# Patient Record
Sex: Female | Born: 1985
Health system: Southern US, Community
[De-identification: ages and names within clinical notes are randomized; demographics above are authoritative.]

## PROBLEM LIST (undated history)

## (undated) DIAGNOSIS — N926 Irregular menstruation, unspecified: Secondary | ICD-10-CM

## (undated) DIAGNOSIS — M329 Systemic lupus erythematosus, unspecified: Secondary | ICD-10-CM

## (undated) DIAGNOSIS — R87619 Unspecified abnormal cytological findings in specimens from cervix uteri: Secondary | ICD-10-CM

## (undated) DIAGNOSIS — A64 Unspecified sexually transmitted disease: Secondary | ICD-10-CM

## (undated) DIAGNOSIS — S86012A Strain of left Achilles tendon, initial encounter: Secondary | ICD-10-CM

## (undated) DIAGNOSIS — G43909 Migraine, unspecified, not intractable, without status migrainosus: Secondary | ICD-10-CM

## (undated) DIAGNOSIS — IMO0002 Reserved for concepts with insufficient information to code with codable children: Secondary | ICD-10-CM

## (undated) HISTORY — PX: INTRAUTERINE DEVICE (IUD) INSERTION: SHX5877

## (undated) HISTORY — PX: COLPOSCOPY: SHX161

## (undated) HISTORY — DX: Unspecified abnormal cytological findings in specimens from cervix uteri: R87.619

## (undated) HISTORY — DX: Systemic lupus erythematosus, unspecified: M32.9

## (undated) HISTORY — DX: Unspecified sexually transmitted disease: A64

## (undated) HISTORY — DX: Reserved for concepts with insufficient information to code with codable children: IMO0002

## (undated) HISTORY — PX: WISDOM TOOTH EXTRACTION: SHX21

---

## 2011-10-22 DIAGNOSIS — M329 Systemic lupus erythematosus, unspecified: Secondary | ICD-10-CM | POA: Insufficient documentation

## 2012-04-07 ENCOUNTER — Encounter: Payer: Self-pay | Admitting: Certified Nurse Midwife

## 2012-05-21 ENCOUNTER — Ambulatory Visit (INDEPENDENT_AMBULATORY_CARE_PROVIDER_SITE_OTHER): Payer: 59 | Admitting: Certified Nurse Midwife

## 2012-05-21 ENCOUNTER — Encounter: Payer: Self-pay | Admitting: Certified Nurse Midwife

## 2012-05-21 VITALS — BP 96/60 | Ht 65.0 in | Wt 204.0 lb

## 2012-05-21 DIAGNOSIS — Z01419 Encounter for gynecological examination (general) (routine) without abnormal findings: Secondary | ICD-10-CM

## 2012-05-21 DIAGNOSIS — M329 Systemic lupus erythematosus, unspecified: Secondary | ICD-10-CM

## 2012-05-21 NOTE — Patient Instructions (Signed)

## 2012-05-21 NOTE — Progress Notes (Signed)
27 y.o. SingleAfrican American female   G0P0000 here for annual exam. Periods are light and monthly. No partner change.  No STD screening needed. Diagnosed with Lupus 9-13 on medication. Sees Rheumatologist for follow-up No health issues  today   Patient's last menstrual period was 05/14/2012.          Sexually active: no  The current method of family planning is IUD.    Exercising: yes  walking Last mammogram: none Last pap: 05-01-11 Last BMD: none Alcohol: 1 a week Tobacco: none   Health Maintenance  Topic Date Due  . Pap Smear  05/22/2005  . Influenza Vaccine  10/18/2012  . Tetanus/tdap  02/17/2017    Family History  Problem Relation Age of Onset  . Hypertension Mother   . Hypertension Father   . Cancer Father     pancreatic  . Depression Father     There is no problem list on file for this patient.   Past Medical History  Diagnosis Date  . Chlamydia 2008    pt treated with neg toc  . 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 Lupus     Past Surgical History  Procedure Laterality Date  . Wisdom teeth extracted    . Colposcopy  10/2008    Allergies: Review of patient's allergies indicates no known allergies.  Current Outpatient Prescriptions  Medication Sig Dispense Refill  . hydroxychloroquine (PLAQUENIL) 200 MG tablet       . IBUPROFEN PO Take by mouth as needed.      Marland Kitchen levonorgestrel (MIRENA) 20 MCG/24HR IUD 1 each by Intrauterine route once.      . phentermine (ADIPEX-P) 37.5 MG tablet       . valACYclovir (VALTREX) 500 MG tablet Take 500 mg by mouth daily.       No current facility-administered medications for this visit.    ROS: Pertinent items are noted in HPI.  Exam:    BP 96/60  Ht 5\' 5"  (1.651 m)  Wt 204 lb (92.534 kg)  BMI 33.95 kg/m2  LMP 05/14/2012 Weight change: @WEIGHTCHANGE @ Last 3 height recordings:  Ht Readings from Last 3 Encounters:  05/21/12 5\' 5"  (1.651 m)   General appearance: alert and cooperative Head: Normocephalic, without obvious  abnormality, atraumatic Neck: no adenopathy, supple, symmetrical, trachea midline and thyroid not enlarged, symmetric, no tenderness/mass/nodules Lungs: clear to auscultation bilaterally Breasts: normal appearance, no masses or tenderness, Inspection negative, No nipple discharge or bleeding Heart: regular rate and rhythm Abdomen: soft, non-tender; bowel sounds normal; no masses,  no organomegaly Extremities: extremities normal, atraumatic, no cyanosis or edema Skin: Skin color, texture, turgor normal. No rashes or lesions Lymph nodes: Cervical, supraclavicular, and axillary nodes normal. no inguinal nodes palpated Neurologic: Alert and oriented X 3, normal strength and tone. Normal symmetric reflexes. Normal coordination and gait   Pelvic: External genitalia:  no lesions              Urethra: normal appearing urethra with no masses, tenderness or lesions              Bartholins and Skenes: Bartholin's, Urethra, Skene's normal                 Vagina: normal appearing vagina with normal color and discharge, no lesions              Cervix: normal appearance and IUD string visualized              Pap taken: yes  Bimanual Exam:  Uterus:  uterus is normal size, shape, consistency and nontender, anteverted                                      Adnexa:    normal adnexa in size, nontender and no masses                                      Rectovaginal: Confirms                                      Anus:  normal sphincter tone, no lesions  A: well woman Contraception Mirena IUD Newly diagnosed Lupus under follow up     P Reviewed health and wellness pertinent to exam Pap smear as indicated  return annually or prn      An After Visit Summary was printed and given to the patient.  Reviewed, TL

## 2012-05-25 LAB — IPS PAP TEST WITH REFLEX TO HPV

## 2012-05-27 ENCOUNTER — Telehealth: Payer: Self-pay | Admitting: Orthopedic Surgery

## 2012-05-27 NOTE — Telephone Encounter (Signed)
Spoke with pt about abnormal Pap and need for colposcopy with DL. Instructed pt she cannot be on her period at the time of appt. Sched colpo 06-02-12 at 1400 with DL. Instructed pt to take motrin 1 hr before the appt time to help with cramping. Pt agreeable.  aa

## 2012-06-02 ENCOUNTER — Ambulatory Visit: Payer: 59 | Admitting: Certified Nurse Midwife

## 2012-07-05 ENCOUNTER — Telehealth: Payer: Self-pay | Admitting: *Deleted

## 2012-07-05 NOTE — Telephone Encounter (Signed)
Patient has canceled her colpo appt and not rescheduled.  Has talked with carolyn in insurance dept regarding OOP cost and we have re-verified her estimate and it has decreased from [phone] down to 264.66v due to processing of other claims.  Patient reported to carolyn she could still not afford this and declined to resched colpo.  Call to patient to discuss, LMTCB.

## 2012-09-07 ENCOUNTER — Telehealth: Payer: Self-pay | Admitting: *Deleted

## 2012-09-07 NOTE — Telephone Encounter (Signed)
Follow up call to patient to schedule colpo that patient previously canceled due to cost.  Patient states she has had other claims filed and thinks PPO cost may have changed and requests we recheck. Advised that I can have amout recheck and recommend she make decision on plan to get colpo done here or another facility if cost prevents her from coming here.  Stressed that pap smear is a screening test to rule out cancer and that since her pap is abnormal, colpo recommended for evaluation.  Explained, "not tying to scare you but to stress the importance of this test". Will send chart to precert and have them call patient with updated estimate.

## 2012-09-08 NOTE — Telephone Encounter (Signed)
Kennon Rounds,  Thank you on your follow through for this patient.  Continue to keep her in our pap recall until we have her plan solidified.  [employer]

## 2012-09-10 ENCOUNTER — Telehealth: Payer: Self-pay | Admitting: Certified Nurse Midwife

## 2012-09-10 NOTE — Telephone Encounter (Signed)
Spoke with patient about her insurance benefits for her colpo. Patient said that she cannot afford that amount and will need to push it out farther. She expressed that she may need to look for another place to have this procedure done since we do not allow payment plans. She asked if I would check to see if there were any other options (specially related to payment) since this needs to be completed.

## 2012-09-12 NOTE — Telephone Encounter (Signed)
Please follow office policy.  She needs a 30 day letter, Kennon Rounds.

## 2012-09-14 NOTE — Telephone Encounter (Signed)
See next phone note regarding updated cost.

## 2012-09-15 ENCOUNTER — Ambulatory Visit: Payer: 59 | Admitting: Obstetrics and Gynecology

## 2012-09-15 NOTE — Telephone Encounter (Signed)
Certified letter mailed 09-13-12.

## 2012-10-05 ENCOUNTER — Telehealth: Payer: Self-pay | Admitting: Obstetrics & Gynecology

## 2012-10-05 NOTE — Telephone Encounter (Signed)
Returning your phone call about her appointment.

## 2012-10-05 NOTE — Telephone Encounter (Signed)
LMTCB to schedule colpo. Advised that patient must be seen by 8/29 or she will be released due to her receiving a 30 day letter that was sent out on 7/29.

## 2012-10-05 NOTE — Telephone Encounter (Signed)
Patient returning Carolynn's call and is ready to schedule.

## 2012-10-06 NOTE — Telephone Encounter (Signed)
Spoke with patient about the urgency of her having a colpo before her 30 day window is up. Patient is schedule with Debbi on 8/27 @ 2pm. Patient understands that she must be seen before 8/29 or she will be released from the practice. Patient is aware of her OOP for the procedure and that we will collect that amount at check-in.

## 2012-10-13 ENCOUNTER — Ambulatory Visit (INDEPENDENT_AMBULATORY_CARE_PROVIDER_SITE_OTHER): Payer: 59 | Admitting: Certified Nurse Midwife

## 2012-10-13 ENCOUNTER — Encounter: Payer: Self-pay | Admitting: Certified Nurse Midwife

## 2012-10-13 VITALS — BP 110/72 | HR 68 | Ht 65.0 in | Wt 205.0 lb

## 2012-10-13 DIAGNOSIS — R87612 Low grade squamous intraepithelial lesion on cytologic smear of cervix (LGSIL): Secondary | ICD-10-CM

## 2012-10-13 NOTE — Progress Notes (Addendum)
Patient ID: Tammy Giles, female   DOB: Jun 26, 1985, 27 y.o.   MRN: [id number]  Chief Complaint  Patient presents with  . Procedure    colposcopy    HPI Tammy Giles is a 27 y.o. female.  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 This was a follow up yearly pap from previous pap in 2013 which was LSIL. Patient aware colposcopy recommended because it is still present. 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 Previous colposcopy: in  2010 showed LSIL. Prior cervical treatment: no treatment and follow up pap only. 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 Chlamydia 2008    pt treated with neg toc  . 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 Lupus     Past Surgical History  Procedure Laterality Date  . Wisdom teeth extracted    . Colposcopy  10/2008    Family History  Problem Relation Age of Onset  . Hypertension Mother   . Hypertension Father   . Cancer Father     pancreatic  . Depression Father     Social History History  Substance Use Topics  . Smoking status: Never Smoker   . Smokeless tobacco: Never Used  . Alcohol Use: 0.5 oz/week    1 drink(s) per week    No Known Allergies  Current Outpatient Prescriptions  Medication Sig Dispense Refill  . hydroxychloroquine (PLAQUENIL) 200 MG tablet       . IBUPROFEN PO Take by mouth as needed.      Marland Kitchen levonorgestrel (MIRENA) 20 MCG/24HR IUD 1 each by Intrauterine route once.      . valACYclovir (VALTREX) 500 MG tablet Take 500 mg by mouth daily.       No current facility-administered medications for this visit.    Review of Systems Review of Systems  Genitourinary: Negative for vaginal bleeding, vaginal discharge and vaginal pain.  Pertinent to exam negative  Blood pressure 110/72, pulse 68, height 5\' 5"  (1.651 m), weight 205 lb (92.987 kg), last  menstrual period 09/29/2012.  Physical Exam Physical Exam  Constitutional: She is oriented to person, place, and time. She appears well-developed and well-nourished.  Genitourinary:    Neurological: She is alert and oriented to person, place, and time.    Data Reviewed AEX 05/21/12 normal  Assessment   History of LSIL pap colposcopy indicated. 2-Gardasil  Incomplete series, patient considering  vaccination Procedure Details  The risks and benefits of the procedure and Written informed consent obtained.  Speculum placed in vagina and excellent visualization of cervix achieved, cervix swabbed with saline solution for mucous removal, IUD string noted in cervical os, cervix swabbed x 3 with acetic acid solution with acetowhite area noted at 3-5 o'clock area. Cervix viewed with 3.75,7.5,15# and green filter. Lugols solution applied and non staining noted at 5 o'clock.  Biopsy taken. ECC obtained. Monsel's applied. No active bleeding noted upon speculum removal. Patient tolerated procedure well.  Specimens: 2  Complications: none.     Plan    Specimens labelled and sent to Pathology. Patient will be called with pathology results. Written instructions given to patient..    Pathology: Biopsy at 5 o'clock:Squamous mucosa with LSIL, mild dyplasia, changes consistent with HPV effect No high grade dysplasia JYN:WGNFA appearing endocervical mucosa and glands with no atypia or carcinoma noted. Pathology agrees with previous pap of LSIL Patient  to be notified of results and importance of follow up with pap smear in one year.   [facility] 10/13/2012, 3:18 PM Note reviewed, agree with plan.  Douglass Rivers, MD

## 2012-10-20 ENCOUNTER — Telehealth: Payer: Self-pay | Admitting: Orthopedic Surgery

## 2012-10-20 NOTE — Telephone Encounter (Signed)
Spoke with pt about colpo results. Advised pt she needs to have repeat Pap next year with Aex to make sure it has not progressed. Pt thinks she has had 2 Gardasil vaccines so far. Pt seeing MD for lupus tomorrow and will ask to make sure this is OK to finish.

## 2012-10-20 NOTE — Telephone Encounter (Signed)
Message copied by Alfredo Batty on Wed Oct 20, 2012  2:36 PM ------      Message from: Verner Chol      Created: Tue Oct 19, 2012  8:25 AM       Notify patient that biopsy showed LSIL, mild dysplasia consistent with HPV changes.      ECC negative for atypia or cancer.      It is very important for her to follow up with pap smear in one year, to make sure this has not progressed.      08      Did she complete Gardasil series? If not she will need to complete if no contraindications per her MD with Lupus ------

## 2012-10-20 NOTE — Telephone Encounter (Signed)
LMTCB for results. aa 

## 2012-10-20 NOTE — Telephone Encounter (Signed)
Message copied by Alfredo Batty on Wed Oct 20, 2012  1:24 PM ------      Message from: Verner Chol      Created: Tue Oct 19, 2012  8:25 AM       Notify patient that biopsy showed LSIL, mild dysplasia consistent with HPV changes.      ECC negative for atypia or cancer.      It is very important for her to follow up with pap smear in one year, to make sure this has not progressed.      08      Did she complete Gardasil series? If not she will need to complete if no contraindications per her MD with Lupus ------

## 2012-11-15 ENCOUNTER — Telehealth: Payer: Self-pay | Admitting: Certified Nurse Midwife

## 2012-11-15 NOTE — Telephone Encounter (Signed)
Patient has a question about the way her insurance was filed.

## 2012-11-16 NOTE — Telephone Encounter (Signed)
Patient calling to speak with Shanda Bumps in billing again.

## 2012-11-17 NOTE — Telephone Encounter (Signed)
Patient calling to speak with Shanda Bumps again

## 2013-01-17 ENCOUNTER — Telehealth: Payer: Self-pay | Admitting: *Deleted

## 2013-01-17 MED ORDER — VALACYCLOVIR HCL 500 MG PO TABS: 500.0000 mg | ORAL_TABLET | Freq: Every day | ORAL | Status: DC

## 2013-01-17 NOTE — Telephone Encounter (Signed)
Annual Exam 05/21/12 Rx was not given, but was in chart that it was refilled 12/09/11.

## 2013-01-17 NOTE — Telephone Encounter (Signed)
Ok to refill 

## 2013-05-25 ENCOUNTER — Ambulatory Visit (INDEPENDENT_AMBULATORY_CARE_PROVIDER_SITE_OTHER): Payer: 59 | Admitting: Certified Nurse Midwife

## 2013-05-25 ENCOUNTER — Encounter: Payer: Self-pay | Admitting: Certified Nurse Midwife

## 2013-05-25 ENCOUNTER — Ambulatory Visit: Payer: Self-pay | Admitting: Certified Nurse Midwife

## 2013-05-25 VITALS — BP 122/74 | HR 80 | Ht 65.25 in | Wt 227.0 lb

## 2013-05-25 DIAGNOSIS — Z01419 Encounter for gynecological examination (general) (routine) without abnormal findings: Secondary | ICD-10-CM

## 2013-05-25 DIAGNOSIS — Z0000 Encounter for general adult medical examination without abnormal findings: Secondary | ICD-10-CM

## 2013-05-25 LAB — POCT URINALYSIS DIPSTICK
BILIRUBIN UA: NEGATIVE
Blood, UA: NEGATIVE
Glucose, UA: NEGATIVE
Ketones, UA: NEGATIVE
LEUKOCYTES UA: NEGATIVE
NITRITE UA: NEGATIVE
PH UA: 5
Protein, UA: NEGATIVE
UROBILINOGEN UA: NEGATIVE

## 2013-05-25 LAB — HEMOGLOBIN, FINGERSTICK: HEMOGLOBIN, FINGERSTICK: 13.7 g/dL (ref 12.0–16.0)

## 2013-05-25 NOTE — Progress Notes (Signed)
Patient ID: Tammy Giles, female   DOB: 04-03-1985, 28 y.o.   MRN: [id number] 28 y.o. G0P0000 Single African American Fe here for annual exam. Periods spotting or light with Mirena IUD. No partner change or STD screening needed.Continues with immunologist for care for Lupus, medication stable Uses Urgent care if needed. Aware she has gained some weight, has started on exercise and portion control again. No health issues today.   Patient's last menstrual period was 05/18/2013.          Sexually active: no  The current method of family planning is IUD and abstinence.    Exercising: yes  Gym/ health club routine includes aerobics 2 times per week. Smoker:  no  Health Maintenance: Pap:  05/21/12, LGSIL; Colpo, LSIL, mild dysplasia consistent with HPV changes TDaP:  2009 Gardasil:  ? completed 3 injections Labs:  HB:  13.7 Urine:negative    reports that she has never smoked. She has never used smokeless tobacco. She reports that she drinks about 1.5 - 2 ounces of alcohol per week. She reports that she does not use illicit drugs.  Past Medical History  Diagnosis Date  . Chlamydia 2008    pt treated with neg toc  . 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 Lupus     Past Surgical History  Procedure Laterality Date  . Wisdom teeth extracted    . Colposcopy  10/2008    Current Outpatient Prescriptions  Medication Sig Dispense Refill  . hydroxychloroquine (PLAQUENIL) 200 MG tablet       . IBUPROFEN PO Take by mouth as needed.      Marland Kitchen levonorgestrel (MIRENA) 20 MCG/24HR IUD 1 each by Intrauterine route once.      . SUMAtriptan (IMITREX) 100 MG tablet Take 100 mg by mouth every 2 (two) hours as needed for migraine or headache. May repeat in 2 hours if headache persists or recurs.      . valACYclovir (VALTREX) 500 MG tablet Take 1 tablet (500 mg total) by mouth daily.  30 tablet  11   No current facility-administered medications for this visit.    Family History  Problem Relation Age of Onset  .  Hypertension Mother   . Hypertension Father   . Cancer Father     pancreatic  . Depression Father     ROS:  Pertinent items are noted in HPI.  Otherwise, a comprehensive ROS was negative.  Exam:   BP 122/74  Pulse 80  Ht 5' 5.25" (1.657 m)  Wt 227 lb (102.967 kg)  BMI 37.50 kg/m2  LMP 05/18/2013 Height: 5' 5.25" (165.7 cm)  Ht Readings from Last 3 Encounters:  05/25/13 5' 5.25" (1.657 m)  10/13/12 5\' 5"  (1.651 m)  05/21/12 5\' 5"  (1.651 m)    General appearance: alert, cooperative and appears stated age Head: Normocephalic, without obvious abnormality, atraumatic Neck: no adenopathy, supple, symmetrical, trachea midline and thyroid normal to inspection and palpation Lungs: clear to auscultation bilaterally Breasts: normal appearance, no masses or tenderness, No nipple retraction or dimpling, No nipple discharge or bleeding, No axillary or supraclavicular adenopathy Heart: regular rate and rhythm Abdomen: soft, non-tender; no masses,  no organomegaly Extremities: extremities normal, atraumatic, no cyanosis or edema Skin: Skin color, texture, turgor normal. No rashes or lesions Lymph nodes: Cervical, supraclavicular, and axillary nodes normal. No abnormal inguinal nodes palpated Neurologic: Grossly normal   Pelvic: External genitalia:  no lesions              Urethra:  normal appearing  urethra with no masses, tenderness or lesions              Bartholin's and Skene's: normal                 Vagina: normal appearing vagina with normal color and discharge, no lesions              Cervix: normal appearance, non tender IUD string present              Pap taken: yes Bimanual Exam:  Uterus:  normal size, contour, position, consistency, mobility, non-tender and anteverted              Adnexa: normal adnexa and no mass, fullness, tenderness               Rectovaginal: Confirms               Anus:  deferred  A:  Well Woman with normal exam  Contraception Mirena  History of  abnormal pap smear with LSIL, CIN 1 with colpo follow up pap today  Lupus stable on medication, sees immunologist for follow up  P:   Reviewed health and wellness pertinent to exam  Mirena due for removal 4/18  Pap smear as per guidelines   pap smear taken today with reflex if normal repeat one year,if not per result  counseled on breast self exam, STD prevention, HIV risk factors and prevention, adequate intake of calcium and vitamin D, diet and exercise encouraged.  return annually or prn  An After Visit Summary was printed and given to the patient.

## 2013-05-25 NOTE — Patient Instructions (Signed)
General topics  Next pap or exam is  due in 1 year Take a Women's multivitamin Take 1200 mg. of calcium daily - prefer dietary If any concerns in interim to call back  Breast Self-Awareness Practicing breast self-awareness may pick up problems early, prevent significant medical complications, and possibly save your life. By practicing breast self-awareness, you can become familiar with how your breasts look and feel and if your breasts are changing. This allows you to notice changes early. It can also offer you some reassurance that your breast health is good. One way to learn what is normal for your breasts and whether your breasts are changing is to do a breast self-exam. If you find a lump or something that was not present in the past, it is best to contact your caregiver right away. Other findings that should be evaluated by your caregiver include nipple discharge, especially if it is bloody; skin changes or reddening; areas where the skin seems to be pulled in (retracted); or new lumps and bumps. Breast pain is seldom associated with cancer (malignancy), but should also be evaluated by a caregiver. BREAST SELF-EXAM The best time to examine your breasts is 5 7 days after your menstrual period is over.  ExitCare Patient Information 2013 [employer].   Exercise to Stay Healthy Exercise helps you become and stay healthy. EXERCISE IDEAS AND TIPS Choose exercises that:  You enjoy.  Fit into your day. You do not need to exercise really hard to be healthy. You can do exercises at a slow or medium level and stay healthy. You can:  Stretch before and after working out.  Try yoga, Pilates, or tai chi.  Lift weights.  Walk fast, swim, jog, run, climb stairs, bicycle, dance, or rollerskate.  Take aerobic classes. Exercises that burn about 150 calories:  Running 1  miles in 15 minutes.  Playing volleyball for 45 to 60 minutes.  Washing and waxing a car for 45 to 60  minutes.  Playing touch football for 45 minutes.  Walking 1  miles in 35 minutes.  Pushing a stroller 1  miles in 30 minutes.  Playing basketball for 30 minutes.  Raking leaves for 30 minutes.  Bicycling 5 miles in 30 minutes.  Walking 2 miles in 30 minutes.  Dancing for 30 minutes.  Shoveling snow for 15 minutes.  Swimming laps for 20 minutes.  Walking up stairs for 15 minutes.  Bicycling 4 miles in 15 minutes.  Gardening for 30 to 45 minutes.  Jumping rope for 15 minutes.  Washing windows or floors for 45 to 60 minutes. Document Released: 03/08/2010 Document Revised: 04/28/2011 Document Reviewed: 03/08/2010 ExitCare Patient Information 2013 [employer].   Other topics ( that may be useful information):    Sexually Transmitted Disease Sexually transmitted disease (STD) refers to any infection that is passed from person to person during sexual activity. This may happen by way of saliva, semen, blood, vaginal mucus, or urine. Common STDs include:  Gonorrhea.  Chlamydia.  Syphilis.  HIV/AIDS.  Genital herpes.  Hepatitis B and C.  Trichomonas.  Human papillomavirus (HPV).  Pubic lice. CAUSES  An STD may be spread by bacteria, virus, or parasite. A person can get an STD by:  Sexual intercourse with an infected person.  Sharing sex toys with an infected person.  Sharing needles with an infected person.  Having intimate contact with the genitals, mouth, or rectal areas of an infected person. SYMPTOMS  Some people may not have any symptoms, but   not have any symptoms, but they can still pass the infection to others. Different STDs have different symptoms. Symptoms include:  Painful or bloody urination.  Pain in the pelvis, abdomen, vagina, anus, throat, or eyes.  Skin rash, itching, irritation, growths, or sores (lesions). These usually occur in the genital or anal area.  Abnormal vaginal discharge.  Penile discharge in men.  Soft, flesh-colored skin growths in the  genital or anal area.  Fever.  Pain or bleeding during sexual intercourse.  Swollen glands in the groin area.  Yellow skin and eyes (jaundice). This is seen with hepatitis. DIAGNOSIS  To make a diagnosis, your caregiver may:  Take a medical history.  Perform a physical exam.  Take a specimen (culture) to be examined.  Examine a sample of discharge under a microscope.  Perform blood test TREATMENT   Chlamydia, gonorrhea, trichomonas, and syphilis can be cured with antibiotic medicine.  Genital herpes, hepatitis, and HIV can be treated, but not cured, with prescribed medicines. The medicines will lessen the symptoms.  Genital warts from HPV can be treated with medicine or by freezing, burning (electrocautery), or surgery. Warts may come back.  HPV is a virus and cannot be cured with medicine or surgery.However, abnormal areas may be followed very closely by your caregiver and may be removed from the cervix, vagina, or vulva through office procedures or surgery. If your diagnosis is confirmed, your recent sexual partners need treatment. This is true even if they are symptom-free or have a negative culture or evaluation. They should not have sex until their caregiver says it is okay. HOME CARE INSTRUCTIONS  All sexual partners should be informed, tested, and treated for all STDs.  Take your antibiotics as directed. Finish them even if you start to feel better.  Only take over-the-counter or prescription medicines for pain, discomfort, or fever as directed by your caregiver.  Rest.  Eat a balanced diet and drink enough fluids to keep your urine clear or pale yellow.  Do not have sex until treatment is completed and you have followed up with your caregiver. STDs should be checked after treatment.  Keep all follow-up appointments, Pap tests, and blood tests as directed by your caregiver.  Only use latex condoms and water-soluble lubricants during sexual activity. Do not use  petroleum jelly or oils.  Avoid alcohol and illegal drugs.  Get vaccinated for HPV and hepatitis. If you have not received these vaccines in the past, talk to your caregiver about whether one or both might be right for you.  Avoid risky sex practices that can break the skin. The only way to avoid getting an STD is to avoid all sexual activity.Latex condoms and dental dams (for oral sex) will help lessen the risk of getting an STD, but will not completely eliminate the risk. SEEK MEDICAL CARE IF:   You have a fever.  You have any new or worsening symptoms. Document Released: 04/26/2002 Document Revised: 04/28/2011 Document Reviewed: 05/03/2010 [facility] Patient Information 2013 [place].    Domestic Abuse You are being battered or abused if someone close to you hits, pushes, or physically hurts you in any way. You also are being abused if you are forced into activities. You are being sexually abused if you are forced to have sexual contact of any kind. You are being emotionally abused if you are made to feel worthless or if you are constantly threatened. It is important to remember that help is available. No one has the right to  ABUSE  Learn the warning signs of danger. This varies with situations but may include: the use of alcohol, threats, isolation from friends and family, or forced sexual contact. Leave if you feel that violence is going to occur.  If you are attacked or beaten, report it to the police so the abuse is documented. You do not have to press charges. The police can protect you while you or the attackers are leaving. Get the officer's name and badge number and a copy of the report.  Find someone you can trust and tell them what is happening to you: your caregiver, a nurse, clergy member, close friend or family member. Feeling ashamed is natural, but remember that you have done nothing wrong. No one deserves abuse. Document Released:  02/01/2000 Document Revised: 04/28/2011 Document Reviewed: 04/11/2010 ExitCare Patient Information 2013 [employer]Care[employer].    How Much is Too Much Alcohol? Drinking too much alcohol can cause injury, accidents, and health problems. These types of problems can include:   Car crashes.  Falls.  Family fighting (domestic violence).  Drowning.  Fights.  Injuries.  Burns.  Damage to certain organs.  Having a baby with birth defects. ONE DRINK CAN BE TOO MUCH WHEN YOU ARE:  Working.  Pregnant or breastfeeding.  Taking medicines. Ask your doctor.  Driving or planning to drive. If you or someone you know has a drinking problem, get help from a doctor.  Document Released: 11/30/2008 Document Revised: 04/28/2011 Document Reviewed: 11/30/2008 ExitCare Patient Information 2013 ExitCare[employer].   Smoking Hazards Smoking cigarettes is extremely bad for your health. Tobacco smoke has over 200 known poisons in it. There are over 60 chemicals in tobacco smoke that cause cancer. Some of the chemicals found in cigarette smoke include:   Cyanide.  Benzene.  Formaldehyde.  Methanol (wood alcohol).  Acetylene (fuel used in welding torches).  Ammonia. Cigarette smoke also contains the poisonous gases nitrogen oxide and carbon monoxide.  Cigarette smokers have an increased risk of many serious medical problems and Smoking causes approximately:  90% of all lung cancer deaths in men.  80% of all lung cancer deaths in women.  90% of deaths from chronic obstructive lung disease. Compared with nonsmokers, smoking increases the risk of:  Coronary heart disease by 2 to 4 times.  Stroke by 2 to 4 times.  Men developing lung cancer by 23 times.  Women developing lung cancer by 13 times.  Dying from chronic obstructive lung diseases by 12 times.  . Smoking is the most preventable cause of death and disease in our society.  WHY IS SMOKING ADDICTIVE?  Nicotine is the chemical  agent in tobacco that is capable of causing addiction or dependence.  When you smoke and inhale, nicotine is absorbed rapidly into the bloodstream through your lungs. Nicotine absorbed through the lungs is capable of creating a powerful addiction. Both inhaled and non-inhaled nicotine may be addictive.  Addiction studies of cigarettes and spit tobacco show that addiction to nicotine occurs mainly during the teen years, when young people begin using tobacco products. WHAT ARE THE BENEFITS OF QUITTING?  There are many health benefits to quitting smoking.   Likelihood of developing cancer and heart disease decreases. Health improvements are seen almost immediately.  Blood pressure, pulse rate, and breathing patterns start returning to normal soon after quitting. QUITTING SMOKING   American Lung Association - 1-800-LUNGUSA  American Cancer Society - 1-800-ACS-2345 Document Released: 03/13/2004 Document Revised: 04/28/2011 Document Reviewed: 11/15/2008 ExitCare Patient Information 2013 ExitCare,   ExitCare Patient Information 2013 [place], [place].   Stress Management Stress is a state of physical or mental tension that often results from changes in your life or normal routine. Some common causes of stress are:  Death of a loved one.  Injuries or severe illnesses.  Getting fired or changing jobs.  Moving into a new home. Other causes may be:  Sexual problems.  Business or financial losses.  Taking on a large debt.  Regular conflict with someone at home or at work.  Constant tiredness from lack of sleep. It is not just bad things that are stressful. It may be stressful to:  Win the lottery.  Get married.  Buy a new car. The amount of stress that can be easily tolerated varies from person to person. Changes generally cause stress, regardless of the types of change. Too much stress can affect your health. It may lead to physical or emotional problems. Too little stress (boredom) may also become stressful. SUGGESTIONS TO  REDUCE STRESS:  Talk things over with your family and friends. It often is helpful to share your concerns and worries. If you feel your problem is serious, you may want to get help from a professional counselor.  Consider your problems one at a time instead of lumping them all together. Trying to take care of everything at once may seem impossible. List all the things you need to do and then start with the most important one. Set a goal to accomplish 2 or 3 things each day. If you expect to do too many in a single day you will naturally fail, causing you to feel even more stressed.  Do not use alcohol or drugs to relieve stress. Although you may feel better for a short time, they do not remove the problems that caused the stress. They can also be habit forming.  Exercise regularly - at least 3 times per week. Physical exercise can help to relieve that "uptight" feeling and will relax you.  The shortest distance between despair and hope is often a good night's sleep.  Go to bed and get up on time allowing yourself time for appointments without being rushed.  Take a short "time-out" period from any stressful situation that occurs during the day. Close your eyes and take some deep breaths. Starting with the muscles in your face, tense them, hold it for a few seconds, then relax. Repeat this with the muscles in your neck, shoulders, hand, stomach, back and legs.  Take good care of yourself. Eat a balanced diet and get plenty of rest.  Schedule time for having fun. Take a break from your daily routine to relax. HOME CARE INSTRUCTIONS   Call if you feel overwhelmed by your problems and feel you can no longer manage them on your own.  Return immediately if you feel like hurting yourself or someone else. Document Released: 07/30/2000 Document Revised: 04/28/2011 Document Reviewed: 03/22/2007 [facility] Patient Information 2013 [place], [place].  Exercise to Lose Weight Exercise and a healthy diet  may help you lose weight. Your doctor may suggest specific exercises. EXERCISE IDEAS AND TIPS  Choose low-cost things you enjoy doing, such as walking, bicycling, or exercising to workout videos.  Take stairs instead of the elevator.  Walk during your lunch break.  Park your car further away from work or school.  Go to a gym or an exercise class.  Start with 5 to 10 minutes of exercise each day. Build up to 30 minutes of exercise 4 to  6 days a week.  Wear shoes with good support and comfortable clothes.  Stretch before and after working out.  Work out until you breathe harder and your heart beats faster.  Drink extra water when you exercise.  Do not do so much that you hurt yourself, feel dizzy, or get very short of breath. Exercises that burn about 150 calories:  Running 1  miles in 15 minutes.  Playing volleyball for 45 to 60 minutes.  Washing and waxing a car for 45 to 60 minutes.  Playing touch football for 45 minutes.  Walking 1  miles in 35 minutes.  Pushing a stroller 1  miles in 30 minutes.  Playing basketball for 30 minutes.  Raking leaves for 30 minutes.  Bicycling 5 miles in 30 minutes.  Walking 2 miles in 30 minutes.  Dancing for 30 minutes.  Shoveling snow for 15 minutes.  Swimming laps for 20 minutes.  Walking up stairs for 15 minutes.  Bicycling 4 miles in 15 minutes.  Gardening for 30 to 45 minutes.  Jumping rope for 15 minutes.  Washing windows or floors for 45 to 60 minutes. Document Released: 03/08/2010 Document Revised: 04/28/2011 Document Reviewed: 03/08/2010 [facility] Patient Information 2014 [place], [place].

## 2013-05-27 LAB — IPS PAP TEST WITH REFLEX TO HPV

## 2013-05-27 NOTE — Progress Notes (Signed)
Reviewed personally.  M. Suzanne Geo Slone, MD.  

## 2013-05-29 ENCOUNTER — Other Ambulatory Visit: Payer: Self-pay | Admitting: Certified Nurse Midwife

## 2013-05-29 DIAGNOSIS — R87612 Low grade squamous intraepithelial lesion on cytologic smear of cervix (LGSIL): Secondary | ICD-10-CM

## 2013-05-30 ENCOUNTER — Telehealth: Payer: Self-pay | Admitting: Emergency Medicine

## 2013-05-30 NOTE — Telephone Encounter (Signed)
Spoke with patient and message from [name] CNM given. Patient is agreeable to schedule colposcopy. She remembers procedure from last year.   Colposcopy pre-procedure instructions given.  Patient has Mirena IUD and has light short periods, if any. Will call if has any bleeding prior to appointment.  Motrin instructions given. Motrin=Advil=Ibuprofen Can take 800 mg (Can purchase over the counter, you will need four 200 mg pills) every 8 hours as needed.  Take with food. Make sure to eat a meal before appointment and drink plenty of fluids. Patient verbalized understanding and will call to reschedule if will be on menses or has any concerns regarding pregnancy. Advised will need to cancel within 24 hours or will have $100.00 no show fee placed to account.  Order is precerted. Patient aware of benefits. She is waiting for her benefits card in the mail, so requested appointment to be scheduled further out to end of the month.   Routing to provider for final review. Patient agreeable to disposition. Will close encounter

## 2013-05-30 NOTE — Telephone Encounter (Signed)
Message copied by Joeseph AmorFAST, Rithika Seel L on Mon May 30, 2013  1:28 PM ------      Message from: Verner CholLEONARD, DEBORAH S      Created: Sun May 29, 2013  5:22 PM       Please notify patient that pap smear again shows LSIL as in 2014 and will need another colpo to make sure no other changes.      We discussed this if pap was abnormal at [facility]. Very important she has this follow up      Order in ------

## 2013-06-10 ENCOUNTER — Telehealth: Payer: Self-pay | Admitting: Orthopedic Surgery

## 2013-06-10 NOTE — Telephone Encounter (Signed)
Spoke with pt to reschedule colposcopy appt for 06-14-13 as DL will be out of the office that day. Pt is on cycle now with IUD. Made appt for 06-16-13 at 2 pm with DL. Pt to give 3 days notice if she has a conflict.

## 2013-06-14 ENCOUNTER — Ambulatory Visit: Payer: 59 | Admitting: Certified Nurse Midwife

## 2013-06-16 ENCOUNTER — Ambulatory Visit: Payer: 59

## 2013-06-16 ENCOUNTER — Ambulatory Visit (INDEPENDENT_AMBULATORY_CARE_PROVIDER_SITE_OTHER): Payer: 59 | Admitting: Certified Nurse Midwife

## 2013-06-16 ENCOUNTER — Encounter: Payer: Self-pay | Admitting: Certified Nurse Midwife

## 2013-06-16 VITALS — BP 110/64 | HR 70 | Resp 16 | Ht 65.25 in | Wt 225.0 lb

## 2013-06-16 DIAGNOSIS — R87612 Low grade squamous intraepithelial lesion on cytologic smear of cervix (LGSIL): Secondary | ICD-10-CM

## 2013-06-16 DIAGNOSIS — R87619 Unspecified abnormal cytological findings in specimens from cervix uteri: Secondary | ICD-10-CM

## 2013-06-16 NOTE — Progress Notes (Addendum)
Patient ID: Tammy Giles, female   DOB: 02/04/1986, 28 y.o.   MRN: [id number]  Chief Complaint  Patient presents with  . Colposcopy    HPI Tammy Giles is a 28 y.o. female.  African American G0P0 here for colposcopy exam. Denies pelvic pain or vaginal bleeding. Contraception Mirena IUD.  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 Previous colposcopy: LSIL/CIN 1 in 05/21/12 Prior cervical treatment: none.  Past Medical History  Diagnosis Date  . Chlamydia 2008    pt treated with neg toc  . 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 Lupus   . Abnormal Pap smear of cervix     2009,2010,2011, 2013 LSIL,05-25-13 LGSIL    Past Surgical History  Procedure Laterality Date  . Wisdom teeth extracted    . Colposcopy      2009 CIN1 & 2010  . Intrauterine device (iud) insertion      mirena iud inserted 06-06-11    Family History  Problem Relation Age of Onset  . Hypertension Mother   . Hypertension Father   . Cancer Father     pancreatic  . Depression Father     Social History History  Substance Use Topics  . Smoking status: Never Smoker   . Smokeless tobacco: Never Used  . Alcohol Use: 1.5 - 2.0 oz/week    3-4 drink(s) per week    No Known Allergies  Current Outpatient Prescriptions  Medication Sig Dispense Refill  . hydroxychloroquine (PLAQUENIL) 200 MG tablet daily.       . IBUPROFEN PO Take by mouth as needed.      Marland Kitchen. levonorgestrel (MIRENA) 20 MCG/24HR IUD 1 each by Intrauterine route once.      Marland Kitchen. NAPROXEN PO Take by mouth as needed.      . SUMAtriptan (IMITREX) 100 MG tablet Take 100 mg by mouth every 2 (two) hours as needed for migraine or headache. May repeat in 2 hours if headache persists or recurs.      . valACYclovir (VALTREX) 500 MG tablet Take 1 tablet (500 mg total) by mouth daily.  30 tablet  11   No current facility-administered medications for this visit.    Review of  Systems Review of Systems  Constitutional: Negative.   Genitourinary: Negative for vaginal bleeding, vaginal discharge and vaginal pain.    Blood pressure 110/64, pulse 70, resp. rate 16, height 5' 5.25" (1.657 m), weight 225 lb (102.059 kg), last menstrual period 05/18/2013.  Physical Exam Physical Exam  Constitutional: She is oriented to person, place, and time. She appears well-developed and well-nourished.  Genitourinary: Vagina normal. No vaginal discharge found.    Neurological: She is alert and oriented to person, place, and time.  Skin: Skin is warm and dry.  Psychiatric: She has a normal mood and affect. Judgment normal.    Data Reviewed  Reviewed pap smear with patient. Questions addressed.  Assessment   Colposcopy exam Procedure Details  The risks and benefits of the procedure and Written informed consent obtained.  Speculum placed in vagina and excellent visualization of cervix achieved, cervix swabbed x 3 with saline and  acetic acid solution. Acetowhite response noted at 7 o'clock and 12 o'clock. Cervix viewed with 3.75,7.7,15# and green filter. Lugol's solution applied with same area no staining noted. Biopsy taken at 7, 12 o'clock. ECC taken. Monsel's applied. No active bleeding noted. Speculum removed. Patient tolerated procedure well. Instructions given.  Specimens: 3  Complications: none.  Plan    Specimens labelled and sent to Pathology. Patient will be notified after pathology reviewed. Pathology reviewed and both biopsy showed LSIL mild dysplasia, consistent with HPV change. ECC did not survive processing. Patient to be notified of results and need for follow up pap in one year. Pap recall       Verner CholDeborah S Leonard 06/16/2013, 2:50 PM

## 2013-06-16 NOTE — Patient Instructions (Signed)

## 2013-06-16 NOTE — Progress Notes (Signed)
Pt has hx of LGSIL on paps from 2009,2010,2011,2013 & colpo CIN1 2009. Pap 05-25-13 LGSIL Pt took 800mg  ibuprofen at 1:30 today

## 2013-06-16 NOTE — Progress Notes (Signed)
Reviewed personally.  M. Suzanne Coline Calkin, MD.  

## 2013-06-21 LAB — IPS OTHER TISSUE BIOPSY

## 2014-03-21 ENCOUNTER — Other Ambulatory Visit: Payer: Self-pay | Admitting: Certified Nurse Midwife

## 2014-03-21 NOTE — Telephone Encounter (Signed)
Medication refill request: Valtrex 500 mg Last AEX:  05/25/13 Next AEX: 05/29/14 Last MMG (if hormonal medication request): none Refill authorized: 01/17/13 #[id number]. Today #30/2R?

## 2014-04-26 DIAGNOSIS — Z79899 Other long term (current) drug therapy: Secondary | ICD-10-CM | POA: Insufficient documentation

## 2014-05-13 ENCOUNTER — Encounter (HOSPITAL_COMMUNITY): Payer: Self-pay | Admitting: Nurse Practitioner

## 2014-05-13 ENCOUNTER — Emergency Department (HOSPITAL_COMMUNITY): Payer: 59

## 2014-05-13 ENCOUNTER — Emergency Department (HOSPITAL_COMMUNITY)
Admission: EM | Admit: 2014-05-13 | Discharge: 2014-05-13 | Disposition: A | Discharge status: Home / Self Care | Payer: 59 | Attending: Emergency Medicine | Admitting: Emergency Medicine

## 2014-05-13 DIAGNOSIS — Y9367 Activity, basketball: Secondary | ICD-10-CM | POA: Diagnosis not present

## 2014-05-13 DIAGNOSIS — Z8619 Personal history of other infectious and parasitic diseases: Secondary | ICD-10-CM | POA: Diagnosis not present

## 2014-05-13 DIAGNOSIS — S86002A Unspecified injury of left Achilles tendon, initial encounter: Secondary | ICD-10-CM | POA: Diagnosis not present

## 2014-05-13 DIAGNOSIS — M329 Systemic lupus erythematosus, unspecified: Secondary | ICD-10-CM | POA: Diagnosis not present

## 2014-05-13 DIAGNOSIS — Z79899 Other long term (current) drug therapy: Secondary | ICD-10-CM | POA: Diagnosis not present

## 2014-05-13 DIAGNOSIS — Z793 Long term (current) use of hormonal contraceptives: Secondary | ICD-10-CM | POA: Insufficient documentation

## 2014-05-13 DIAGNOSIS — S99912A Unspecified injury of left ankle, initial encounter: Secondary | ICD-10-CM | POA: Diagnosis present

## 2014-05-13 DIAGNOSIS — X58XXXA Exposure to other specified factors, initial encounter: Secondary | ICD-10-CM | POA: Insufficient documentation

## 2014-05-13 DIAGNOSIS — Y92310 Basketball court as the place of occurrence of the external cause: Secondary | ICD-10-CM | POA: Insufficient documentation

## 2014-05-13 DIAGNOSIS — S86012A Strain of left Achilles tendon, initial encounter: Secondary | ICD-10-CM

## 2014-05-13 DIAGNOSIS — Y998 Other external cause status: Secondary | ICD-10-CM | POA: Insufficient documentation

## 2014-05-13 HISTORY — DX: Strain of left Achilles tendon, initial encounter: S86.012A

## 2014-05-13 MED ORDER — HYDROCODONE-ACETAMINOPHEN 5-325 MG PO TABS
2.0000 | ORAL_TABLET | Freq: Once | ORAL | Status: AC
Start: 2014-05-13 — End: 2014-05-13
  Administered 2014-05-13: 2 via ORAL
  Filled 2014-05-13: qty 2

## 2014-05-13 MED ORDER — HYDROCODONE-ACETAMINOPHEN 5-325 MG PO TABS: 1.0000 | ORAL_TABLET | Freq: Four times a day (QID) | ORAL | Status: DC | PRN

## 2014-05-13 NOTE — Discharge Instructions (Signed)
Achilles Tendon Rupture with Phase I Rehab A complete tear in the Achilles tendon is known as an Achilles tendon rupture. The Achilles tendon, which is also known as the heel cord, connects the large calf muscles (gastrocnemius and soleus) to the heel bone (calcaneus) and is essential for proper functioning of the calf muscles. The calf muscles are required for pushing the foot downward and are necessary for walking, running, and jumping. SYMPTOMS   A "pop" or tear may be felt at the time of injury.  Pain and weakness during movement, especially when raising the heel.  Tenderness, swelling, warmth, and redness around the Achilles tendon.  Bruising (contusion) around the back of the ankle after 48 hours.  Loss of firmness to the touch over the area of the Achilles tendon that ruptured. CAUSES   Achilles tendon rupture is most commonly caused by a sudden force placed upon the Achilles tendon that is greater than the tendon can withstand (for example jumping, hurdling, or sprinting).  Achilles tendon rupture may also occur from direct trauma or injury to the lower leg, foot, or ankle. RISK INCREASES WITH:  Physical activity that involves sudden muscle contraction.  Poor strength and flexibility of the lower leg.  Previous injury to the tendon.  Untreated Achilles tendinitis.  Steroid injection into the Achilles tendon.  Medical conditions, such as poor circulation due to a cardiovascular condition or obesity. PREVENTION   Include a proper warm-up and stretching routine before physical activity.  Allow for rest and recovery between physical activity.  Maintain lower leg fitness:  Flexibility.  Muscle strength.  Muscular endurance.  Cardiovascular fitness.  Mechanical prevention:  Taping.  Protective strapping.  An adhesive bandaging that has been recommended prior to physical activity. TREATMENT  Initially one should not walk on the affected leg. One should also  ice the injured area, apply compression with an elastic bandage, and elevate the injured leg to eye level. Both surgical and nonsurgical interventions exist for definitive treatment. The return to sports is usually about the same with either treatment course but can occur a few weeks sooner with surgery.   Nonsurgical treatment typically requires the immobilization in the form of a long leg cast (from toes to groin) for 4 to 9 weeks. The long leg cast is followed by a short leg cast or walking boot for an additional 4 to 12 weeks.  Advantages: Non-surgical treatment lacks the risks involved with anesthesia or surgery (such as infection, bleeding, or injury to nerves).  Disadvantages: Non-surgical treatment involves a longer period of immobilization. This may result in stiffer ankle and knee joints. Also, the calf muscles are slightly weaker and the risk of repeat rupture is higher.  Surgical treatment typically requires sewing the ends of the tendon back together, followed by immobilization (typically a short leg cast or walking boot).  Advantages: Surgical treatment does not usually require the immobilization of the knee. Surgery also offers a lower risk of repeat rupture of the tendon and slightly stronger calf muscles.  Disadvantages: Surgical treatment does include the risks of anesthesia and surgery. These risks include impaired wound healing and injury to a nerve that provides sensation to the side of the foot. PROGNOSIS   Achilles tendon ruptures are typically curable if treated correctly.  A period of 4 to 9 months is typical before a return to sports. RELATED COMPLICATIONS   Calf muscle weakness may occur, especially if the rupture goes untreated.  The possibility of repeat rupture exists despite treatment.  Prolonged disability can occur.  Risks of surgery include infection, bleeding, injury to nerves, and impaired wound healing. MEDICATION   If pain medication is necessary,  then nonsteroidal anti-inflammatory medications, such as aspirin and ibuprofen, or other minor pain relievers, such as acetaminophen, are often recommended.  Do not take pain medication within 7 days before surgery.  All medications should be taken under the direction of your caregiver. Contact your caregiver immediately if any bleeding, stomach upset, or signs of allergic reaction occur.  Prescription pain medication may be prescribed as necessary by your caregiver. Use only as directed and only as much as you need. SEEK MEDICAL CARE IF:   Pain continues to increase, despite treatment.  Cast discomfort develops.  New, unexplained symptoms develop.  You are experiencing any side effects form the drugs used in treatment.

## 2014-05-13 NOTE — ED Provider Notes (Signed)
CSN: [id number]     Arrival date & time 05/13/14  1246 History   First MD Initiated Contact with Patient 05/13/14 1624     Chief Complaint  Patient presents with  . Ankle Injury     (Consider location/radiation/quality/duration/timing/severity/associated sxs/prior Treatment) HPI Tammy Giles is a 29 year old female who presents the ER complaining of left ankle pain. Patient states she was playing basketball, began to run when she noticed a "popping sensation" in the posterior aspect of her left ankle. Patient reported immediate pain and "tightness". Patient was seen in the urgent care who recommended patient follow-up in the emergency room. Patient denies numbness, weakness, tingling, loss of sensation or function.  Past Medical History  Diagnosis Date  . Chlamydia 2008    pt treated with neg toc  . 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 Lupus   . Abnormal Pap smear of cervix     2009,2010,2011, 2013 LSIL,05-25-13 LGSIL   Past Surgical History  Procedure Laterality Date  . Wisdom teeth extracted    . Colposcopy      2009 CIN1 & 2010  . Intrauterine device (iud) insertion      mirena iud inserted 06-06-11   Family History  Problem Relation Age of Onset  . Hypertension Mother   . Hypertension Father   . Cancer Father     pancreatic  . Depression Father    History  Substance Use Topics  . Smoking status: Never Smoker   . Smokeless tobacco: Never Used  . Alcohol Use: 1.5 - 2.0 oz/week    3-4 drink(s) per week   OB History    Gravida Para Term Preterm AB TAB SAB Ectopic Multiple Living       Review of Systems  Musculoskeletal: Positive for arthralgias.  Neurological: Negative for weakness and numbness.      Allergies  Other  Home Medications   Prior to Admission medications   Medication Sig Start Date End Date Taking? Authorizing Provider  Ascorbic Acid (VITAMIN C) 1000 MG tablet Take 1,000 mg by mouth daily as needed (feeling sick).   Yes Historical  Provider, MD  aspirin-acetaminophen-caffeine (EXCEDRIN MIGRAINE) [phone] MG per tablet Take 2 tablets by mouth daily as needed for migraine.   Yes Historical Provider, MD  EPINEPHrine 0.3 mg/0.3 mL IJ SOAJ injection Inject 0.3 mg into the muscle as needed (allergic reaction).    Yes Historical Provider, MD  folic acid (FOLVITE) 400 MCG tablet Take 400 mcg by mouth at bedtime.   Yes Historical Provider, MD  hydroxychloroquine (PLAQUENIL) 200 MG tablet Take 200 mg by mouth at bedtime.  04/25/12  Yes Historical Provider, MD  ibuprofen (ADVIL,MOTRIN) 200 MG tablet Take 800 mg by mouth every 6 (six) hours as needed (pain).   Yes Historical Provider, MD  levonorgestrel (MIRENA) 20 MCG/24HR IUD 1 each by Intrauterine route once. Implanted Fall 2013   Yes Historical Provider, MD  naproxen sodium (ANAPROX) 220 MG tablet Take 440 mg by mouth 2 (two) times daily as needed. Aleve   Yes Historical Provider, MD  SUMAtriptan (IMITREX) 50 MG tablet Take 50 mg by mouth daily as needed for migraine. May repeat in 2 hours if headache persists or recurs.   Yes Historical Provider, MD  valACYclovir (VALTREX) 500 MG tablet TAKE 1 TABLET BY MOUTH DAILY Patient taking differently: TAKE 1 TABLET BY MOUTH DAILY, AT BEDTIME, MAY TAKE AN ADDITIONAL TABLET IN THE MORNING AS NEEDED FOR OUTBREAKS 03/21/14  Yes Verner Choleborah S Leonard, CNM  HYDROcodone-acetaminophen (NORCO/VICODIN) 5-325 MG per tablet Take 1-2 tablets by mouth every 6 (six) hours as needed. 05/13/14   Ladona MowJoe Dyane Broberg, PA-C   BP 141/95 mmHg  Pulse 87  Temp(Src) 98.6 F (37 C) (Oral)  Resp 18  Ht 5\' 5"  (1.651 m)  Wt 226 lb (102.513 kg)  BMI 37.61 kg/m2  SpO2 98%  LMP 04/29/2014 Physical Exam  Constitutional: She appears well-developed and well-nourished. No distress.  HENT:  Head: Normocephalic and atraumatic.  Eyes: Conjunctivae are normal. Right eye exhibits no discharge. Left eye exhibits no discharge. No scleral icterus.  Cardiovascular:  Peripheral pulses intact  at injured extremity.   Pulmonary/Chest: Effort normal. No respiratory distress.  Musculoskeletal:  Left ankle exam: Patient has limited range of motion due to pain. Patient has tenderness throughout Achilles tendon, as well as a I penis of her Achilles tendon noted on left compared to right. Mild erythema and swelling at the distal portion of patient's gastrocnemius. Thompson test shows absent plantar flexion of left foot as compared to the unaffected foot on right. Patient has DP pulses are 2+. Capillary refill is less than 2 seconds. Distal sensation intact.  Neurological: She is alert.  No numbness distal to injury.    Skin: Skin is warm and dry. No rash noted. She is not diaphoretic.  Nursing note and vitals reviewed.   ED Course  Procedures (including critical care time) Labs Review Labs Reviewed - No data to display  Imaging Review Dg Ankle Complete Left  05/13/2014   CLINICAL DATA:  Left ankle injury playing basketball  EXAM: LEFT ANKLE COMPLETE - 3+ VIEW  COMPARISON:  None.  FINDINGS: Three views of left ankle submitted. No acute fracture or subluxation. Tiny plantar spur of calcaneus. Ankle mortise is preserved.  IMPRESSION: No acute fracture or subluxation.  Tiny plantar spur of calcaneus.   Electronically Signed   By: Natasha MeadLiviu  Pop M.D.   On: 05/13/2014 13:36     EKG Interpretation None      MDM   Final diagnoses:  Achilles tendon rupture, left, initial encounter    Patient here with likely Achilles tendon injury based on history and exam. Patient neurovascularly intact. Patient placed in Cam Walker boot, given crutches. Patient strongly encouraged to follow-up with orthopedics. RICE therapy discussed with patient, return precautions discussed with patient. Patient verbalizes understanding and agreement of this plan. I encouraged patient to call or return to ER if any worsening symptoms or should she have any questions or concerns.  BP 141/95 mmHg  Pulse 87  Temp(Src)  98.6 F (37 C) (Oral)  Resp 18  Ht 5\' 5"  (1.651 m)  Wt 226 lb (102.513 kg)  BMI 37.61 kg/m2  SpO2 98%  LMP 04/29/2014  Signed,  Ladona MowJoe Phil Michels, PA-C 12:44 AM  Patient discussed with Dr. Donnetta HutchingBrian Cook, MD  Ladona MowJoe Rayquan Amrhein, PA-C 05/14/14 [id number]  Donnetta HutchingBrian Cook, MD 05/17/14 1229

## 2014-05-13 NOTE — ED Notes (Signed)
Pt was playing basketball this am when she fellt a "pop" in her L ankle and onset severe pain. [facility] and [facility] were concerned she may have a ruptured achilles tendon, sent her to ED for further evaluation.

## 2014-05-13 NOTE — Progress Notes (Signed)
Orthopedic Tech Progress Note Patient Details:  Tammy Giles 09/28/1985 [id number] Applied CAM walker to LLE.  Fit pt. for crutches and taught use of same. Ortho Devices Type of Ortho Device: CAM walker, Crutches Ortho Device/Splint Location: LLE Ortho Device/Splint Interventions: Application   Lesle ChrisGilliland, Manuelito Poage L 05/13/2014, 6:09 PM

## 2014-05-17 ENCOUNTER — Other Ambulatory Visit (HOSPITAL_BASED_OUTPATIENT_CLINIC_OR_DEPARTMENT_OTHER): Payer: Self-pay | Admitting: Orthopaedic Surgery

## 2014-05-18 ENCOUNTER — Other Ambulatory Visit (HOSPITAL_BASED_OUTPATIENT_CLINIC_OR_DEPARTMENT_OTHER): Payer: Self-pay | Admitting: Orthopaedic Surgery

## 2014-05-18 ENCOUNTER — Encounter (HOSPITAL_BASED_OUTPATIENT_CLINIC_OR_DEPARTMENT_OTHER): Payer: Self-pay | Admitting: *Deleted

## 2014-05-24 ENCOUNTER — Encounter (HOSPITAL_BASED_OUTPATIENT_CLINIC_OR_DEPARTMENT_OTHER): Payer: Self-pay | Admitting: Certified Registered"

## 2014-05-24 ENCOUNTER — Encounter (HOSPITAL_BASED_OUTPATIENT_CLINIC_OR_DEPARTMENT_OTHER)
Admission: RE | Disposition: A | Discharge status: Home / Self Care | Payer: Self-pay | Source: Ambulatory Visit | Attending: Orthopaedic Surgery

## 2014-05-24 ENCOUNTER — Ambulatory Visit (HOSPITAL_BASED_OUTPATIENT_CLINIC_OR_DEPARTMENT_OTHER): Payer: 59 | Admitting: Certified Registered"

## 2014-05-24 ENCOUNTER — Ambulatory Visit (HOSPITAL_BASED_OUTPATIENT_CLINIC_OR_DEPARTMENT_OTHER)
Admission: RE | Admit: 2014-05-24 | Discharge: 2014-05-24 | Disposition: A | Discharge status: Home / Self Care | Payer: 59 | Source: Ambulatory Visit | Attending: Orthopaedic Surgery | Admitting: Orthopaedic Surgery

## 2014-05-24 DIAGNOSIS — Y999 Unspecified external cause status: Secondary | ICD-10-CM | POA: Insufficient documentation

## 2014-05-24 DIAGNOSIS — L930 Discoid lupus erythematosus: Secondary | ICD-10-CM | POA: Diagnosis not present

## 2014-05-24 DIAGNOSIS — G43909 Migraine, unspecified, not intractable, without status migrainosus: Secondary | ICD-10-CM | POA: Insufficient documentation

## 2014-05-24 DIAGNOSIS — Z975 Presence of (intrauterine) contraceptive device: Secondary | ICD-10-CM | POA: Diagnosis not present

## 2014-05-24 DIAGNOSIS — Z79899 Other long term (current) drug therapy: Secondary | ICD-10-CM | POA: Insufficient documentation

## 2014-05-24 DIAGNOSIS — Z7982 Long term (current) use of aspirin: Secondary | ICD-10-CM | POA: Insufficient documentation

## 2014-05-24 DIAGNOSIS — Y939 Activity, unspecified: Secondary | ICD-10-CM | POA: Insufficient documentation

## 2014-05-24 DIAGNOSIS — Y929 Unspecified place or not applicable: Secondary | ICD-10-CM | POA: Insufficient documentation

## 2014-05-24 DIAGNOSIS — S86012A Strain of left Achilles tendon, initial encounter: Secondary | ICD-10-CM | POA: Insufficient documentation

## 2014-05-24 DIAGNOSIS — X58XXXA Exposure to other specified factors, initial encounter: Secondary | ICD-10-CM | POA: Insufficient documentation

## 2014-05-24 DIAGNOSIS — Z6837 Body mass index (BMI) 37.0-37.9, adult: Secondary | ICD-10-CM | POA: Insufficient documentation

## 2014-05-24 HISTORY — DX: Migraine, unspecified, not intractable, without status migrainosus: G43.909

## 2014-05-24 HISTORY — DX: Irregular menstruation, unspecified: N92.6

## 2014-05-24 HISTORY — DX: Strain of left Achilles tendon, initial encounter: S86.012A

## 2014-05-24 HISTORY — PX: ACHILLES TENDON SURGERY: SHX542

## 2014-05-24 SURGERY — REPAIR, TENDON, ACHILLES
Anesthesia: Regional | Site: Ankle | Laterality: Left

## 2014-05-24 MED ORDER — BUPIVACAINE-EPINEPHRINE (PF) 0.5% -1:200000 IJ SOLN
INTRAMUSCULAR | Status: DC | PRN
  Administered 2014-05-24: 30 mL via PERINEURAL

## 2014-05-24 MED ORDER — DEXAMETHASONE SODIUM PHOSPHATE 4 MG/ML IJ SOLN
INTRAMUSCULAR | Status: DC | PRN
  Administered 2014-05-24: 10 mg via INTRAVENOUS

## 2014-05-24 MED ORDER — HYDROMORPHONE HCL 1 MG/ML IJ SOLN: 0.2500 mg | INTRAMUSCULAR | Status: DC | PRN

## 2014-05-24 MED ORDER — ASPIRIN EC 325 MG PO TBEC: 325.0000 mg | DELAYED_RELEASE_TABLET | Freq: Two times a day (BID) | ORAL | Status: DC

## 2014-05-24 MED ORDER — METHOCARBAMOL 750 MG PO TABS: 750.0000 mg | ORAL_TABLET | Freq: Two times a day (BID) | ORAL | Status: DC | PRN

## 2014-05-24 MED ORDER — LIDOCAINE HCL 4 % MT SOLN
OROMUCOSAL | Status: DC | PRN
  Administered 2014-05-24: 2 mL via TOPICAL

## 2014-05-24 MED ORDER — SUCCINYLCHOLINE CHLORIDE 20 MG/ML IJ SOLN
INTRAMUSCULAR | Status: DC | PRN
  Administered 2014-05-24: 100 mg via INTRAVENOUS

## 2014-05-24 MED ORDER — PROPOFOL 10 MG/ML IV EMUL
INTRAVENOUS | Status: AC
  Filled 2014-05-24: qty 50

## 2014-05-24 MED ORDER — CEFAZOLIN SODIUM-DEXTROSE 2-3 GM-% IV SOLR
2.0000 g | INTRAVENOUS | Status: AC
  Administered 2014-05-24: 2 g via INTRAVENOUS

## 2014-05-24 MED ORDER — MIDAZOLAM HCL 2 MG/2ML IJ SOLN
1.0000 mg | INTRAMUSCULAR | Status: DC | PRN
  Administered 2014-05-24: 2 mg via INTRAVENOUS

## 2014-05-24 MED ORDER — LACTATED RINGERS IV SOLN
INTRAVENOUS | Status: DC
  Administered 2014-05-24 (×2): via INTRAVENOUS

## 2014-05-24 MED ORDER — FENTANYL CITRATE 0.05 MG/ML IJ SOLN
INTRAMUSCULAR | Status: AC
Start: 2014-05-24 — End: 2014-05-24
  Filled 2014-05-24: qty 2

## 2014-05-24 MED ORDER — MIDAZOLAM HCL 2 MG/2ML IJ SOLN
INTRAMUSCULAR | Status: AC
  Filled 2014-05-24: qty 2

## 2014-05-24 MED ORDER — FENTANYL CITRATE 0.05 MG/ML IJ SOLN
INTRAMUSCULAR | Status: AC
  Filled 2014-05-24: qty 6

## 2014-05-24 MED ORDER — FENTANYL CITRATE 0.05 MG/ML IJ SOLN
50.0000 ug | INTRAMUSCULAR | Status: DC | PRN
  Administered 2014-05-24: 100 ug via INTRAVENOUS

## 2014-05-24 MED ORDER — OXYCODONE HCL 5 MG PO TABS: 5.0000 mg | ORAL_TABLET | Freq: Once | ORAL | Status: DC | PRN

## 2014-05-24 MED ORDER — OXYCODONE HCL 5 MG/5ML PO SOLN: 5.0000 mg | Freq: Once | ORAL | Status: DC | PRN

## 2014-05-24 MED ORDER — GLYCOPYRROLATE 0.2 MG/ML IJ SOLN
INTRAMUSCULAR | Status: DC | PRN
  Administered 2014-05-24: 0.2 mg via INTRAVENOUS

## 2014-05-24 MED ORDER — ONDANSETRON HCL 4 MG/2ML IJ SOLN
INTRAMUSCULAR | Status: DC | PRN
  Administered 2014-05-24: 4 mg via INTRAVENOUS

## 2014-05-24 MED ORDER — PROMETHAZINE HCL 25 MG/ML IJ SOLN
6.2500 mg | INTRAMUSCULAR | Status: DC | PRN
Start: 2014-05-24 — End: 2014-05-24

## 2014-05-24 MED ORDER — OXYCODONE-ACETAMINOPHEN 5-325 MG PO TABS: 1.0000 | ORAL_TABLET | ORAL | Status: DC | PRN

## 2014-05-24 MED ORDER — ARTIFICIAL TEARS OP OINT
TOPICAL_OINTMENT | OPHTHALMIC | Status: DC | PRN
  Administered 2014-05-24: 1 via OPHTHALMIC

## 2014-05-24 MED ORDER — PROPOFOL 10 MG/ML IV BOLUS
INTRAVENOUS | Status: DC | PRN
  Administered 2014-05-24: 200 mg via INTRAVENOUS

## 2014-05-24 MED ORDER — KETOROLAC TROMETHAMINE 30 MG/ML IJ SOLN: 30.0000 mg | Freq: Once | INTRAMUSCULAR | Status: DC | PRN

## 2014-05-24 MED ORDER — CEFAZOLIN SODIUM-DEXTROSE 2-3 GM-% IV SOLR
INTRAVENOUS | Status: AC
  Filled 2014-05-24: qty 50

## 2014-05-24 SURGICAL SUPPLY — 68 items
BANDAGE ELASTIC 4 VELCRO ST LF (GAUZE/BANDAGES/DRESSINGS) IMPLANT
BANDAGE ELASTIC 6 VELCRO ST LF (GAUZE/BANDAGES/DRESSINGS) ×4 IMPLANT
BANDAGE ESMARK 6X9 LF (GAUZE/BANDAGES/DRESSINGS) ×1 IMPLANT
BLADE HEX COATED 2.75 (ELECTRODE) ×2 IMPLANT
BLADE SURG 15 STRL LF DISP TIS (BLADE) ×1 IMPLANT
BLADE SURG 15 STRL SS (BLADE) ×1
BNDG COHESIVE 4X5 TAN STRL (GAUZE/BANDAGES/DRESSINGS) ×2 IMPLANT
BNDG ESMARK 6X9 LF (GAUZE/BANDAGES/DRESSINGS) ×2
CANISTER SUCT 1200ML W/VALVE (MISCELLANEOUS) IMPLANT
COVER BACK TABLE 60X90IN (DRAPES) ×2 IMPLANT
CUFF TOURNIQUET SINGLE 24IN (TOURNIQUET CUFF) IMPLANT
CUFF TOURNIQUET SINGLE 34IN LL (TOURNIQUET CUFF) ×2 IMPLANT
DECANTER SPIKE VIAL GLASS SM (MISCELLANEOUS) IMPLANT
DRAPE EXTREMITY T 121X128X90 (DRAPE) ×2 IMPLANT
DRAPE SURG 17X23 STRL (DRAPES) ×4 IMPLANT
DRAPE U 20/CS (DRAPES) ×2 IMPLANT
DRAPE U-SHAPE 47X51 STRL (DRAPES) IMPLANT
DRSG PAD ABDOMINAL 8X10 ST (GAUZE/BANDAGES/DRESSINGS) ×2 IMPLANT
DURAPREP 26ML APPLICATOR (WOUND CARE) ×2 IMPLANT
ELECT REM PT RETURN 9FT ADLT (ELECTROSURGICAL) ×2
ELECTRODE REM PT RTRN 9FT ADLT (ELECTROSURGICAL) ×1 IMPLANT
GAUZE SPONGE 4X4 12PLY STRL (GAUZE/BANDAGES/DRESSINGS) ×2 IMPLANT
GAUZE SPONGE 4X4 16PLY XRAY LF (GAUZE/BANDAGES/DRESSINGS) IMPLANT
GAUZE XEROFORM 1X8 LF (GAUZE/BANDAGES/DRESSINGS) ×2 IMPLANT
GLOVE BIOGEL PI IND STRL 7.0 (GLOVE) ×2 IMPLANT
GLOVE BIOGEL PI INDICATOR 7.0 (GLOVE) ×2
GLOVE ECLIPSE 6.5 STRL STRAW (GLOVE) ×2 IMPLANT
GLOVE NEODERM STRL 7.5 LF PF (GLOVE) ×1 IMPLANT
GLOVE SURG NEODERM 7.5  LF PF (GLOVE) ×1
GLOVE SURG SYN 7.5  E (GLOVE) ×1
GLOVE SURG SYN 7.5 E (GLOVE) ×1 IMPLANT
GOWN STRL REIN XL XLG (GOWN DISPOSABLE) ×2 IMPLANT
GOWN STRL REUS W/ TWL LRG LVL3 (GOWN DISPOSABLE) ×1 IMPLANT
GOWN STRL REUS W/TWL LRG LVL3 (GOWN DISPOSABLE) ×1
KIT IMPLANT SUTURE PARS (Kit) ×2 IMPLANT
NEEDLE HYPO 22GX1.5 SAFETY (NEEDLE) IMPLANT
NS IRRIG 1000ML POUR BTL (IV SOLUTION) ×2 IMPLANT
PACK BASIN DAY SURGERY FS (CUSTOM PROCEDURE TRAY) ×2 IMPLANT
PAD CAST 3X4 CTTN HI CHSV (CAST SUPPLIES) IMPLANT
PAD CAST 4YDX4 CTTN HI CHSV (CAST SUPPLIES) ×2 IMPLANT
PADDING CAST COTTON 3X4 STRL (CAST SUPPLIES)
PADDING CAST COTTON 4X4 STRL (CAST SUPPLIES) ×2
PADDING CAST COTTON 6X4 STRL (CAST SUPPLIES) ×2 IMPLANT
PADDING CAST SYN 6 (CAST SUPPLIES)
PADDING CAST SYNTHETIC 4 (CAST SUPPLIES) ×1
PADDING CAST SYNTHETIC 4X4 STR (CAST SUPPLIES) ×1 IMPLANT
PADDING CAST SYNTHETIC 6X4 NS (CAST SUPPLIES) IMPLANT
PENCIL BUTTON HOLSTER BLD 10FT (ELECTRODE) ×2 IMPLANT
SHEET MEDIUM DRAPE 40X70 STRL (DRAPES) IMPLANT
SLEEVE SCD COMPRESS KNEE MED (MISCELLANEOUS) ×2 IMPLANT
SPLINT FIBERGLASS 3X35 (CAST SUPPLIES) IMPLANT
SPLINT FIBERGLASS 4X30 (CAST SUPPLIES) ×2 IMPLANT
SPONGE LAP 18X18 X RAY DECT (DISPOSABLE) IMPLANT
SPONGE LAP 4X18 X RAY DECT (DISPOSABLE) ×2 IMPLANT
STAPLER VISISTAT (STAPLE) IMPLANT
SUCTION FRAZIER TIP 10 FR DISP (SUCTIONS) IMPLANT
SUT ETHILON 2 0 FS 18 (SUTURE) ×2 IMPLANT
SUT ETHILON 3 0 PS 1 (SUTURE) IMPLANT
SUT FIBERWIRE #2 38 T-5 BLUE (SUTURE)
SUT VIC AB 2-0 CT1 27 (SUTURE) ×1
SUT VIC AB 2-0 CT1 TAPERPNT 27 (SUTURE) ×1 IMPLANT
SUTURE FIBERWR #2 38 T-5 BLUE (SUTURE) IMPLANT
SYR BULB 3OZ (MISCELLANEOUS) ×2 IMPLANT
SYR CONTROL 10ML LL (SYRINGE) IMPLANT
TOWEL OR 17X24 6PK STRL BLUE (TOWEL DISPOSABLE) ×2 IMPLANT
TUBE CONNECTING 20X1/4 (TUBING) ×2 IMPLANT
UNDERPAD 30X30 INCONTINENT (UNDERPADS AND DIAPERS) ×2 IMPLANT
YANKAUER SUCT BULB TIP NO VENT (SUCTIONS) IMPLANT

## 2014-05-24 NOTE — Transfer of Care (Signed)
Immediate Anesthesia Transfer of Care Note  Patient: Production assistant, [name]  Procedure(s) Performed: Procedure(s): LEFT ACHILLES TENDON REPAIR (Left)  Patient Location: PACU  Anesthesia Type:GA combined with regional for post-op pain  Level of Consciousness: awake, alert  and patient cooperative  Airway & Oxygen Therapy: Patient Spontanous Breathing and Patient connected to face mask oxygen  Post-op Assessment: Report given to RN, Post -op Vital signs reviewed and stable and Patient moving all extremities  Post vital signs: Reviewed and stable  Last Vitals:  Filed Vitals:   05/24/14 1221  BP:   Pulse: 99  Temp:   Resp: 15    Complications: No apparent anesthesia complications

## 2014-05-24 NOTE — Op Note (Signed)
   Date of Surgery: 05/24/2014  INDICATIONS: Ms. Tammy Giles is a 29 y.o.-year-old female who sustained an acute left achilles rupture. The risks and benefits of the procedure discussed with the patient prior to the procedure and all questions were answered; consent was obtained.  PREOPERATIVE DIAGNOSIS: left achilles rupture, acute  POSTOPERATIVE DIAGNOSIS: Same   PROCEDURE: Treatment of left achilles rupture , without graft  SURGEON: N. Glee ArvinMichael Xu, M.D.   ANESTHESIA: general   IV FLUIDS AND URINE: See anesthesia record   ESTIMATED BLOOD LOSS: minimal  IMPLANTS: [employer] PARS system  COMPLICATIONS: None.   DESCRIPTION OF PROCEDURE: The patient was brought to the operating room and placed prone on the operating table. The patient's leg had been signed prior to the procedure. The patient had the anesthesia placed by the anesthesiologist. The prep verification and incision time-outs were performed to confirm that this was the correct patient, site, side and location. The patient had an SCD on the opposite lower extremity. A 2 cm longitudinal incision based over the rupture was used.  Blunt dissection was taken down to the level of the paratenon.  The paratenon was sharply incised in line with the incision.  The rupture was exposed.  A space was created between the paratenon and the achilles tendon on both sides going up and down the tendon.  The sural nerve was visualized and protected during the procedure.  An alise clamp was used to pull tension on the tendon end.  The sled was advanced up the tendon proximally making sure the tendon was straddled between the arms.  The sutures were passed sequentially and then brought out through the incision.  The same steps were then repeated for the distal portion of the tendon.  The foot was then placed in maximum plantarflexion and the sutures were tied down.  The wound was then irrigated thoroughly.  The paratenon was was closed using 0 vicryl.  The  subcutaneous layer was closed with 2-0 vicryl and the skin with interrupted 2-0 nylon.  A sterile dressing was applied.  A short leg splint was placed with the ankle in maximum plantarflexion.  The patient awoke from anesthesia uneventfully and transported to the PACU.  POSTOPERATIVE PLAN: The patient will be non weight bearing for two weeks and return for suture removal and transition to a fracture boot.  The patient will be placed on DVT chemoprophylaxis.  Mayra ReelN. Michael Xu, MD [facility] [phone] 12:09 PM

## 2014-05-24 NOTE — Anesthesia Preprocedure Evaluation (Addendum)
Anesthesia Evaluation  Patient identified by MRN, date of birth, ID band Patient awake    Reviewed: Allergy & Precautions, NPO status , Patient's Chart, lab work & pertinent test results  Airway Mallampati: I  TM Distance: >3 FB Neck ROM: Full    Dental  (+) Teeth Intact, Dental Advisory Given   Pulmonary neg pulmonary ROS,  breath sounds clear to auscultation        Cardiovascular negative cardio ROS  Rhythm:Regular Rate:Normal     Neuro/Psych negative neurological ROS     GI/Hepatic negative GI ROS, Neg liver ROS,   Endo/Other  Morbid obesity  Renal/GU negative Renal ROS     Musculoskeletal   Abdominal   Peds  Hematology negative hematology ROS (+)   Anesthesia Other Findings   Reproductive/Obstetrics                            Anesthesia Physical Anesthesia Plan  ASA: II  Anesthesia Plan: General and Regional   Post-op Pain Management:    Induction: Intravenous  Airway Management Planned: Oral ETT  Additional Equipment:   Intra-op Plan:   Post-operative Plan:   Informed Consent: I have reviewed the patients History and Physical, chart, labs and discussed the procedure including the risks, benefits and alternatives for the proposed anesthesia with the patient or authorized representative who has indicated his/her understanding and acceptance.   Dental advisory given  Plan Discussed with: CRNA  Anesthesia Plan Comments:         Anesthesia Quick Evaluation

## 2014-05-24 NOTE — Anesthesia Postprocedure Evaluation (Signed)
  Anesthesia Post-op Note  Patient: Production assistant, radioBrianca German  Procedure(s) Performed: Procedure(s): LEFT ACHILLES TENDON REPAIR (Left)  Patient Location: PACU  Anesthesia Type:GA combined with regional for post-op pain  Level of Consciousness: awake, alert  and oriented  Airway and Oxygen Therapy: Patient Spontanous Breathing  Post-op Pain: none  Post-op Assessment: Post-op Vital signs reviewed  Post-op Vital Signs: Reviewed  Last Vitals:  Filed Vitals:   05/24/14 1330  BP: 126/78  Pulse:   Temp:   Resp: 17    Complications: No apparent anesthesia complications

## 2014-05-24 NOTE — H&P (Signed)
PREOPERATIVE H&P  Chief Complaint: Left achilles rupture  HPI: Tammy Giles is a 29 y.o. female who presents for surgical treatment of Left achilles rupture.  She denies any changes in medical history.  Past Medical History  Diagnosis Date  . Lupus   . Migraines   . Irregular periods     has IUD  . Achilles rupture, left 05/13/2014   Past Surgical History  Procedure Laterality Date  . Colposcopy    . Wisdom tooth extraction     History   Social History  . Marital Status: Single    Spouse Name: N/A  . Number of Children: 0  . Years of Education: N/A   Occupational History  .  [employer]   Social History Main Topics  . Smoking status: Never Smoker   . Smokeless tobacco: Never Used  . Alcohol Use: Yes     Comment: once/week  . Drug Use: No  . Sexual Activity:    Partners: Male    Birth Control/ Protection: IUD     Comment: mirena inserted 06/06/2011   Other Topics Concern  . None   Social History Narrative   Family History  Problem Relation Age of Onset  . Hypertension Mother   . Hypertension Father   . Cancer Father     pancreatic  . Depression Father    Allergies  Allergen Reactions  . Other Swelling    SOMETHING IN MIDDLE EASTERN FOOD CAUSES SWELLING   Prior to Admission medications   Medication Sig Start Date End Date Taking? Authorizing Provider  aspirin 500 MG tablet Take 1,000 mg by mouth 4 (four) times daily.   Yes Historical Provider, MD  folic acid (FOLVITE) 400 MCG tablet Take 400 mcg by mouth at bedtime.   Yes Historical Provider, MD  hydroxychloroquine (PLAQUENIL) 200 MG tablet Take 200 mg by mouth at bedtime.  04/25/12  Yes Historical Provider, MD  levonorgestrel (MIRENA) 20 MCG/24HR IUD 1 each by Intrauterine route once. Implanted Fall 2013   Yes Historical Provider, MD  valACYclovir (VALTREX) 500 MG tablet TAKE 1 TABLET BY MOUTH DAILY Patient taking differently: TAKE 1 TABLET BY MOUTH DAILY, AT BEDTIME, MAY TAKE AN ADDITIONAL  TABLET IN THE MORNING AS NEEDED FOR OUTBREAKS 03/21/14  Yes Verner Choleborah S Leonard, CNM  EPINEPHrine 0.3 mg/0.3 mL IJ SOAJ injection Inject 0.3 mg into the muscle as needed (allergic reaction).     Historical Provider, MD  SUMAtriptan (IMITREX) 50 MG tablet Take 50 mg by mouth daily as needed for migraine. May repeat in 2 hours if headache persists or recurs.    Historical Provider, MD     Positive ROS: All other systems have been reviewed and were otherwise negative with the exception of those mentioned in the HPI and as above.  Physical Exam: General: Alert, no acute distress Cardiovascular: No pedal edema Respiratory: No cyanosis, no use of accessory musculature GI: abdomen soft Skin: No lesions in the area of chief complaint Neurologic: Sensation intact distally Psychiatric: Patient is competent for consent with normal mood and affect Lymphatic: no lymphedema  MUSCULOSKELETAL: exam stable  Assessment: Left achilles rupture  Plan: Plan for Procedure(s): LEFT ACHILLES TENDON REPAIR  The risks benefits and alternatives were discussed with the patient including but not limited to the risks of nonoperative treatment, versus surgical intervention including infection, bleeding, nerve injury,  blood clots, cardiopulmonary complications, morbidity, mortality, among others, and they were willing to proceed.   Cheral AlmasXu, Naiping Michael, MD   05/24/2014 8:16  AM

## 2014-05-24 NOTE — Anesthesia Procedure Notes (Addendum)
Anesthesia Regional Block:  Popliteal block  Pre-Anesthetic Checklist: ,, timeout performed, Correct Patient, Correct Site, Correct Laterality, Correct Procedure, Correct Position, site marked, Risks and benefits discussed,  Surgical consent,  Pre-op evaluation,  At surgeon's request and post-op pain management  Laterality: Left  Prep: chloraprep       Needles:  Injection technique: Single-shot  Needle Type: Echogenic Stimulator Needle     Needle Length: 9cm 9 cm Needle Gauge: 21 and 21 G    Additional Needles:  Procedures: ultrasound guided (picture in chart) and nerve stimulator Popliteal block  Nerve Stimulator or Paresthesia:  Response: tibial, 0.5 mA,  Response: peroneal, 0.5 mA,   Additional Responses:   Narrative:  Start time: 05/24/2014 10:20 AM End time: 05/24/2014 10:27 AM Injection made incrementally with aspirations every 5 mL.  Performed by: Personally  Anesthesiologist: Suzette Battiest  Additional Notes: Risks and benefits explained. Pt tolerated procedure well with no immediate complications.   Procedure Name: Intubation Date/Time: 05/24/2014 11:17 AM Performed by: Baxter Flattery Pre-anesthesia Checklist: Patient identified, Emergency Drugs available, Suction available and Patient being monitored Patient Re-evaluated:Patient Re-evaluated prior to inductionOxygen Delivery Method: Circle System Utilized Preoxygenation: Pre-oxygenation with 100% oxygen Intubation Type: IV induction Ventilation: Mask ventilation without difficulty Laryngoscope Size: Miller and 2 Grade View: Grade I Tube type: Oral Tube size: 7.0 mm Number of attempts: 1 Airway Equipment and Method: Stylet and LTA kit utilized Placement Confirmation: ETT inserted through vocal cords under direct vision,  positive ETCO2 and breath sounds checked- equal and bilateral Secured at: 22 cm Tube secured with: Tape Dental Injury: Teeth and Oropharynx as per pre-operative assessment

## 2014-05-24 NOTE — Discharge Instructions (Signed)
Postoperative instructions:  Weightbearing: Non weight bearing  Dressing instructions: Keep your dressing and/or splint clean and dry at all times.  It will be removed at your first post-operative appointment.  Your stitches and/or staples will be removed at this visit.  Pain control:  You have been given a prescription to be taken as directed for post-operative pain control.  In addition, elevate the operative extremity above the heart at all times to prevent swelling and throbbing pain.  Take over-the-counter Colace, 100mg  by mouth twice a day while taking narcotic pain medications to help prevent constipation.  Follow up appointments: 1) 10-14 days for suture removal and wound check. 2) Dr. Roda ShuttersXu as scheduled.   -------------------------------------------------------------------------------------------------------------  After Surgery Pain Control:  After your surgery, post-surgical discomfort or pain is likely. This discomfort can last several days to a few weeks. At certain times of the day your discomfort may be more intense.  Did you receive a nerve block?  A nerve block can provide pain relief for one hour to two days after your surgery. As long as the nerve block is working, you will experience little or no sensation in the area the surgeon operated on.  As the nerve block wears off, you will begin to experience pain or discomfort. It is very important that you begin taking your prescribed pain medication before the nerve block fully wears off. Treating your pain at the first sign of the block wearing off will ensure your pain is better controlled and more tolerable when full-sensation returns. Do not wait until the pain is intolerable, as the medicine will be less effective. It is better to treat pain in advance than to try and catch up.  General Anesthesia:  If you did not receive a nerve block during your surgery, you will need to start taking your pain medication shortly after your  surgery and should continue to do so as prescribed by your surgeon.  Pain Medication:  Most commonly we prescribe Vicodin and Percocet for post-operative pain. Both of these medications contain a combination of acetaminophen (Tylenol) and a narcotic to help control pain.   It takes between 30 and 45 minutes before pain medication starts to work. It is important to take your medication before your pain level gets too intense.   Nausea is a common side effect of many pain medications. You will want to eat something before taking your pain medicine to help prevent nausea.   If you are taking a prescription pain medication that contains acetaminophen, we recommend that you do not take additional over the counter acetaminophen (Tylenol).  Other pain relieving options:   Using a cold pack to ice the affected area a few times a day (15 to 20 minutes at a time) can help to relieve pain, reduce swelling and bruising.   Elevation of the affected area can also help to reduce pain and swelling.       Post Anesthesia Home Care Instructions  Activity: Get plenty of rest for the remainder of the day. A responsible adult should stay with you for 24 hours following the procedure.  For the next 24 hours, DO NOT: -Drive a car -Advertising copywriterperate machinery -Drink alcoholic beverages -Take any medication unless instructed by your physician -Make any legal decisions or sign important papers.  Meals: Start with liquid foods such as gelatin or soup. Progress to regular foods as tolerated. Avoid greasy, spicy, heavy foods. If nausea and/or vomiting occur, drink only clear liquids until the nausea and/or vomiting  subsides. Call your physician if vomiting continues.  Special Instructions/Symptoms: Your throat may feel dry or sore from the anesthesia or the breathing tube placed in your throat during surgery. If this causes discomfort, gargle with warm salt water. The discomfort should disappear within 24 hours.  If  you had a scopolamine patch placed behind your ear for the management of post- operative nausea and/or vomiting:  1. The medication in the patch is effective for 72 hours, after which it should be removed.  Wrap patch in a tissue and discard in the trash. Wash hands thoroughly with soap and water. 2. You may remove the patch earlier than 72 hours if you experience unpleasant side effects which may include dry mouth, dizziness or visual disturbances. 3. Avoid touching the patch. Wash your hands with soap and water after contact with the patch.   Regional Anesthesia Blocks  1. Numbness or the inability to move the "blocked" extremity may last from 3-48 hours after placement. The length of time depends on the medication injected and your individual response to the medication. If the numbness is not going away after 48 hours, call your surgeon.  2. The extremity that is blocked will need to be protected until the numbness is gone and the  Strength has returned. Because you cannot feel it, you will need to take extra care to avoid injury. Because it may be weak, you may have difficulty moving it or using it. You may not know what position it is in without looking at it while the block is in effect.  3. For blocks in the legs and feet, returning to weight bearing and walking needs to be done carefully. You will need to wait until the numbness is entirely gone and the strength has returned. You should be able to move your leg and foot normally before you try and bear weight or walk. You will need someone to be with you when you first try to ensure you do not fall and possibly risk injury.  4. Bruising and tenderness at the needle site are common side effects and will resolve in a few days.  5. Persistent numbness or new problems with movement should be communicated to the surgeon or the [facility] [id number] [facility] [phone]).   Call your surgeon if you  experience:   1.  Fever over 101.0. 2.  Inability to urinate. 3.  Nausea and/or vomiting. 4.  Extreme swelling or bruising at the surgical site. 5.  Continued bleeding from the incision. 6.  Increased pain, redness or drainage from the incision. 7.  Problems related to your pain medication. 8. Any change in color, movement and/or sensation 9. Any problems and/or concerns

## 2014-05-24 NOTE — Progress Notes (Signed)
Assisted Dr. Rob Fitzgerald with left, ultrasound guided, popliteal block. Side rails up, monitors on throughout procedure. See vital signs in flow sheet. Tolerated Procedure well. 

## 2014-05-25 ENCOUNTER — Encounter (HOSPITAL_BASED_OUTPATIENT_CLINIC_OR_DEPARTMENT_OTHER): Payer: Self-pay | Admitting: Orthopaedic Surgery

## 2014-05-25 NOTE — Addendum Note (Signed)
Addendum  created 05/25/14 1155 by Lance CoonWesley Blanchie Zeleznik, CRNA   Modules edited: Charges VN

## 2014-05-29 ENCOUNTER — Ambulatory Visit: Payer: 59 | Admitting: Certified Nurse Midwife

## 2014-07-18 ENCOUNTER — Telehealth: Payer: Self-pay | Admitting: Certified Nurse Midwife

## 2014-07-18 NOTE — Telephone Encounter (Signed)
Left message regarding upcoming appointment has been canceled and needs to be rescheduled. °

## 2014-07-19 ENCOUNTER — Ambulatory Visit: Payer: Self-pay | Admitting: Certified Nurse Midwife

## 2014-10-13 ENCOUNTER — Encounter: Payer: Self-pay | Admitting: Certified Nurse Midwife

## 2014-10-13 ENCOUNTER — Ambulatory Visit (INDEPENDENT_AMBULATORY_CARE_PROVIDER_SITE_OTHER): Payer: 59 | Admitting: Certified Nurse Midwife

## 2014-10-13 VITALS — BP 104/62 | HR 68 | Resp 16 | Ht 65.25 in | Wt 229.0 lb

## 2014-10-13 DIAGNOSIS — N898 Other specified noninflammatory disorders of vagina: Secondary | ICD-10-CM

## 2014-10-13 DIAGNOSIS — Z124 Encounter for screening for malignant neoplasm of cervix: Secondary | ICD-10-CM

## 2014-10-13 DIAGNOSIS — Z01419 Encounter for gynecological examination (general) (routine) without abnormal findings: Secondary | ICD-10-CM

## 2014-10-13 DIAGNOSIS — Z0000 Encounter for general adult medical examination without abnormal findings: Secondary | ICD-10-CM | POA: Diagnosis not present

## 2014-10-13 LAB — POCT URINALYSIS DIPSTICK
Bilirubin, UA: NEGATIVE
Blood, UA: NEGATIVE
Glucose, UA: NEGATIVE
KETONES UA: NEGATIVE
LEUKOCYTES UA: NEGATIVE
Nitrite, UA: NEGATIVE
PROTEIN UA: NEGATIVE
Urobilinogen, UA: NEGATIVE
pH, UA: 5

## 2014-10-13 NOTE — Patient Instructions (Signed)
General topics  Next pap or exam is  due in 1 year Take a Women's multivitamin Take 1200 mg. of calcium daily - prefer dietary If any concerns in interim to call back  Breast Self-Awareness Practicing breast self-awareness may pick up problems early, prevent significant medical complications, and possibly save your life. By practicing breast self-awareness, you can become familiar with how your breasts look and feel and if your breasts are changing. This allows you to notice changes early. It can also offer you some reassurance that your breast health is good. One way to learn what is normal for your breasts and whether your breasts are changing is to do a breast self-exam. If you find a lump or something that was not present in the past, it is best to contact your caregiver right away. Other findings that should be evaluated by your caregiver include nipple discharge, especially if it is bloody; skin changes or reddening; areas where the skin seems to be pulled in (retracted); or new lumps and bumps. Breast pain is seldom associated with cancer (malignancy), but should also be evaluated by a caregiver. BREAST SELF-EXAM The best time to examine your breasts is 5 7 days after your menstrual period is over.  ExitCare Patient Information 2013 [employer].   Exercise to Stay Healthy Exercise helps you become and stay healthy. EXERCISE IDEAS AND TIPS Choose exercises that:  You enjoy.  Fit into your day. You do not need to exercise really hard to be healthy. You can do exercises at a slow or medium level and stay healthy. You can:  Stretch before and after working out.  Try yoga, Pilates, or tai chi.  Lift weights.  Walk fast, swim, jog, run, climb stairs, bicycle, dance, or rollerskate.  Take aerobic classes. Exercises that burn about 150 calories:  Running 1  miles in 15 minutes.  Playing volleyball for 45 to 60 minutes.  Washing and waxing a car for 45 to 60  minutes.  Playing touch football for 45 minutes.  Walking 1  miles in 35 minutes.  Pushing a stroller 1  miles in 30 minutes.  Playing basketball for 30 minutes.  Raking leaves for 30 minutes.  Bicycling 5 miles in 30 minutes.  Walking 2 miles in 30 minutes.  Dancing for 30 minutes.  Shoveling snow for 15 minutes.  Swimming laps for 20 minutes.  Walking up stairs for 15 minutes.  Bicycling 4 miles in 15 minutes.  Gardening for 30 to 45 minutes.  Jumping rope for 15 minutes.  Washing windows or floors for 45 to 60 minutes. Document Released: 03/08/2010 Document Revised: 04/28/2011 Document Reviewed: 03/08/2010 ExitCare Patient Information 2013 [employer].   Other topics ( that may be useful information):    Sexually Transmitted Disease Sexually transmitted disease (STD) refers to any infection that is passed from person to person during sexual activity. This may happen by way of saliva, semen, blood, vaginal mucus, or urine. Common STDs include:  Gonorrhea.  Chlamydia.  Syphilis.  HIV/AIDS.  Genital herpes.  Hepatitis B and C.  Trichomonas.  Human papillomavirus (HPV).  Pubic lice. CAUSES  An STD may be spread by bacteria, virus, or parasite. A person can get an STD by:  Sexual intercourse with an infected person.  Sharing sex toys with an infected person.  Sharing needles with an infected person.  Having intimate contact with the genitals, mouth, or rectal areas of an infected person. SYMPTOMS  Some people may not have any symptoms, but   they can still pass the infection to others. Different STDs have different symptoms. Symptoms include:  Painful or bloody urination.  Pain in the pelvis, abdomen, vagina, anus, throat, or eyes.  Skin rash, itching, irritation, growths, or sores (lesions). These usually occur in the genital or anal area.  Abnormal vaginal discharge.  Penile discharge in men.  Soft, flesh-colored skin growths in the  genital or anal area.  Fever.  Pain or bleeding during sexual intercourse.  Swollen glands in the groin area.  Yellow skin and eyes (jaundice). This is seen with hepatitis. DIAGNOSIS  To make a diagnosis, your caregiver may:  Take a medical history.  Perform a physical exam.  Take a specimen (culture) to be examined.  Examine a sample of discharge under a microscope.  Perform blood test TREATMENT   Chlamydia, gonorrhea, trichomonas, and syphilis can be cured with antibiotic medicine.  Genital herpes, hepatitis, and HIV can be treated, but not cured, with prescribed medicines. The medicines will lessen the symptoms.  Genital warts from HPV can be treated with medicine or by freezing, burning (electrocautery), or surgery. Warts may come back.  HPV is a virus and cannot be cured with medicine or surgery.However, abnormal areas may be followed very closely by your caregiver and may be removed from the cervix, vagina, or vulva through office procedures or surgery. If your diagnosis is confirmed, your recent sexual partners need treatment. This is true even if they are symptom-free or have a negative culture or evaluation. They should not have sex until their caregiver says it is okay. HOME CARE INSTRUCTIONS  All sexual partners should be informed, tested, and treated for all STDs.  Take your antibiotics as directed. Finish them even if you start to feel better.  Only take over-the-counter or prescription medicines for pain, discomfort, or fever as directed by your caregiver.  Rest.  Eat a balanced diet and drink enough fluids to keep your urine clear or pale yellow.  Do not have sex until treatment is completed and you have followed up with your caregiver. STDs should be checked after treatment.  Keep all follow-up appointments, Pap tests, and blood tests as directed by your caregiver.  Only use latex condoms and water-soluble lubricants during sexual activity. Do not use  petroleum jelly or oils.  Avoid alcohol and illegal drugs.  Get vaccinated for HPV and hepatitis. If you have not received these vaccines in the past, talk to your caregiver about whether one or both might be right for you.  Avoid risky sex practices that can break the skin. The only way to avoid getting an STD is to avoid all sexual activity.Latex condoms and dental dams (for oral sex) will help lessen the risk of getting an STD, but will not completely eliminate the risk. SEEK MEDICAL CARE IF:   You have a fever.  You have any new or worsening symptoms. Document Released: 04/26/2002 Document Revised: 04/28/2011 Document Reviewed: 05/03/2010 [facility] Patient Information 2013 [employer].    Domestic Abuse You are being battered or abused if someone close to you hits, pushes, or physically hurts you in any way. You also are being abused if you are forced into activities. You are being sexually abused if you are forced to have sexual contact of any kind. You are being emotionally abused if you are made to feel worthless or if you are constantly threatened. It is important to remember that help is available. No one has the right to abuse you. PREVENTION OF FURTHER  ABUSE  Learn the warning signs of danger. This varies with situations but may include: the use of alcohol, threats, isolation from friends and family, or forced sexual contact. Leave if you feel that violence is going to occur.  If you are attacked or beaten, report it to the police so the abuse is documented. You do not have to press charges. The police can protect you while you or the attackers are leaving. Get the officer's name and badge number and a copy of the report.  Find someone you can trust and tell them what is happening to you: your caregiver, a nurse, clergy member, close friend or family member. Feeling ashamed is natural, but remember that you have done nothing wrong. No one deserves abuse. Document Released:  02/01/2000 Document Revised: 04/28/2011 Document Reviewed: 04/11/2010 ExitCare Patient Information 2013 [employer].    How Much is Too Much Alcohol? Drinking too much alcohol can cause injury, accidents, and health problems. These types of problems can include:   Car crashes.  Falls.  Family fighting (domestic violence).  Drowning.  Fights.  Injuries.  Burns.  Damage to certain organs.  Having a baby with birth defects. ONE DRINK CAN BE TOO MUCH WHEN YOU ARE:  Working.  Pregnant or breastfeeding.  Taking medicines. Ask your doctor.  Driving or planning to drive. If you or someone you know has a drinking problem, get help from a doctor.  Document Released: 11/30/2008 Document Revised: 04/28/2011 Document Reviewed: 11/30/2008 ExitCare Patient Information 2013 [employer].   Smoking Hazards Smoking cigarettes is extremely bad for your health. Tobacco smoke has over 200 known poisons in it. There are over 60 chemicals in tobacco smoke that cause cancer. Some of the chemicals found in cigarette smoke include:   Cyanide.  Benzene.  Formaldehyde.  Methanol (wood alcohol).  Acetylene (fuel used in welding torches).  Ammonia. Cigarette smoke also contains the poisonous gases nitrogen oxide and carbon monoxide.  Cigarette smokers have an increased risk of many serious medical problems and Smoking causes approximately:  90% of all lung cancer deaths in men.  80% of all lung cancer deaths in women.  90% of deaths from chronic obstructive lung disease. Compared with nonsmokers, smoking increases the risk of:  Coronary heart disease by 2 to 4 times.  Stroke by 2 to 4 times.  Men developing lung cancer by 23 times.  Women developing lung cancer by 13 times.  Dying from chronic obstructive lung diseases by 12 times.  . Smoking is the most preventable cause of death and disease in our society.  WHY IS SMOKING ADDICTIVE?  Nicotine is the chemical  agent in tobacco that is capable of causing addiction or dependence.  When you smoke and inhale, nicotine is absorbed rapidly into the bloodstream through your lungs. Nicotine absorbed through the lungs is capable of creating a powerful addiction. Both inhaled and non-inhaled nicotine may be addictive.  Addiction studies of cigarettes and spit tobacco show that addiction to nicotine occurs mainly during the teen years, when young people begin using tobacco products. WHAT ARE THE BENEFITS OF QUITTING?  There are many health benefits to quitting smoking.   Likelihood of developing cancer and heart disease decreases. Health improvements are seen almost immediately.  Blood pressure, pulse rate, and breathing patterns start returning to normal soon after quitting. QUITTING SMOKING   American Lung Association - 1-800-LUNGUSA  American Cancer Society - 1-800-ACS-2345 Document Released: 03/13/2004 Document Revised: 04/28/2011 Document Reviewed: 11/15/2008 ExitCare Patient Information 2013 [employer]   LL[employer].   Stress Management Stress is a state of physical or mental tension that often results from changes in your life or normal routine. Some common causes of stress are:  Death of a loved one.  Injuries or severe illnesses.  Getting fired or changing jobs.  Moving into a new home. Other causes may be:  Sexual problems.  Business or financial losses.  Taking on a large debt.  Regular conflict with someone at home or at work.  Constant tiredness from lack of sleep. It is not just bad things that are stressful. It may be stressful to:  Win the lottery.  Get married.  Buy a new car. The amount of stress that can be easily tolerated varies from person to person. Changes generally cause stress, regardless of the types of change. Too much stress can affect your health. It may lead to physical or emotional problems. Too little stress (boredom) may also become stressful. SUGGESTIONS TO  REDUCE STRESS:  Talk things over with your family and friends. It often is helpful to share your concerns and worries. If you feel your problem is serious, you may want to get help from a professional counselor.  Consider your problems one at a time instead of lumping them all together. Trying to take care of everything at once may seem impossible. List all the things you need to do and then start with the most important one. Set a goal to accomplish 2 or 3 things each day. If you expect to do too many in a single day you will naturally fail, causing you to feel even more stressed.  Do not use alcohol or drugs to relieve stress. Although you may feel better for a short time, they do not remove the problems that caused the stress. They can also be habit forming.  Exercise regularly - at least 3 times per week. Physical exercise can help to relieve that "uptight" feeling and will relax you.  The shortest distance between despair and hope is often a good night's sleep.  Go to bed and get up on time allowing yourself time for appointments without being rushed.  Take a short "time-out" period from any stressful situation that occurs during the day. Close your eyes and take some deep breaths. Starting with the muscles in your face, tense them, hold it for a few seconds, then relax. Repeat this with the muscles in your neck, shoulders, hand, stomach, back and legs.  Take good care of yourself. Eat a balanced diet and get plenty of rest.  Schedule time for having fun. Take a break from your daily routine to relax. HOME CARE INSTRUCTIONS   Call if you feel overwhelmed by your problems and feel you can no longer manage them on your own.  Return immediately if you feel like hurting yourself or someone else. Document Released: 07/30/2000 Document Revised: 04/28/2011 Document Reviewed: 03/22/2007 ExitCare Patient Information 2013 [employer].   

## 2014-10-13 NOTE — Progress Notes (Signed)
29 y.o. G0P0000 Single  African American Fe here for annual exam. Periods normal to scant with IUD. Happy with choice. Has noticed change in vaginal discharge slight odor, no itching or burning. STD screening requested. 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 Tore left achilles tendon with basketball, healing well now.Sees MD for Lupus management and labs all stable at present. No other health issues today.  Patient's last menstrual period was 10/01/2014.          Sexually active: No.  The current method of family planning is IUD.    Exercising: Yes.    walking Smoker:  no  Health Maintenance: Pap: 05-25-13 LGSIL, colpo 06-16-13 LGSIL MMG:  none Colonoscopy:  none BMD:   none TDaP:  2009 Labs: poct urine-neg Self breast exam: done occ   reports that she has never smoked. She has never used smokeless tobacco. She reports that she drinks about 1.8 - 2.4 oz of alcohol per week. She reports that she does not use illicit drugs.  Past Medical History  Diagnosis Date  . Lupus   . Migraines   . Irregular periods     has IUD  . Achilles rupture, left 05/13/2014    Past Surgical History  Procedure Laterality Date  . Colposcopy    . Wisdom tooth extraction    . Achilles tendon surgery Left 05/24/2014    Procedure: LEFT ACHILLES TENDON REPAIR;  Surgeon: Tarry Kos, MD;  Location: [facility];  Service: Orthopedics;  Laterality: Left;    Current Outpatient Prescriptions  Medication Sig Dispense Refill  . folic acid (FOLVITE) 400 MCG tablet Take 400 mcg by mouth at bedtime.    . hydroxychloroquine (PLAQUENIL) 200 MG tablet Take 200 mg by mouth at bedtime.     Marland Kitchen levonorgestrel (MIRENA) 20 MCG/24HR IUD 1 each by Intrauterine route once. Implanted Fall 2013    . SUMAtriptan (IMITREX) 50 MG tablet Take 50 mg by mouth daily as needed for migraine. May repeat in 2 hours if headache persists or recurs.    . valACYclovir (VALTREX) 500 MG tablet TAKE 1 TABLET BY MOUTH  DAILY (Patient taking differently: TAKE 1 TABLET BY MOUTH DAILY, AT BEDTIME, MAY TAKE AN ADDITIONAL TABLET IN THE MORNING AS NEEDED FOR OUTBREAKS) 30 tablet 2  . EPINEPHrine 0.3 mg/0.3 mL IJ SOAJ injection Inject 0.3 mg into the muscle as needed (allergic reaction).      No current facility-administered medications for this visit.    Family History  Problem Relation Age of Onset  . Hypertension Mother   . Hypertension Father   . Cancer Father     pancreatic  . Depression Father     ROS:  Pertinent items are noted in HPI.  Otherwise, a comprehensive ROS was negative.  Exam:   BP 104/62 mmHg  Pulse 68  Resp 16  Ht 5' 5.25" (1.657 m)  Wt 229 lb (103.874 kg)  BMI 37.83 kg/m2  LMP 10/01/2014 Height: 5' 5.25" (165.7 cm) Ht Readings from Last 3 Encounters:  10/13/14 5' 5.25" (1.657 m)  05/24/14 5\' 5"  (1.651 m)  05/13/14 5\' 5"  (1.651 m)    General appearance: alert, cooperative and appears stated age Head: Normocephalic, without obvious abnormality, atraumatic Neck: no adenopathy, supple, symmetrical, trachea midline and thyroid normal to inspection and palpation Lungs: clear to auscultation bilaterally Breasts: normal appearance, no masses or tenderness, No nipple retraction or dimpling, No nipple discharge or bleeding, No axillary or supraclavicular adenopathy Heart: regular  rate and rhythm Abdomen: soft, non-tender; no masses,  no organomegaly Extremities: extremities normal, atraumatic, no cyanosis or edema Skin: Skin color, texture, turgor normal. No rashes or lesions Lymph nodes: Cervical, supraclavicular, and axillary nodes normal. No abnormal inguinal nodes palpated Neurologic: Grossly normal   Pelvic: External genitalia:  no lesions              Urethra:  normal appearing urethra with no masses, tenderness or lesions              Bartholin's and Skene's: normal                 Vagina: brown slight odorous discharge with blood noted in vagina with normal color and  discharge, no lesions affirm taken              Cervix: normal,non tender, scant blood noted, no CMT ,IUD string noted              Pap taken: Yes.   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  Affirm taken will treat as indicated  Lab: GC,Chlamydia, HIV,RPR  Continue follow up with MD as indicated, will send copy of labs  Pap smear as above with HPVHR, if negative repeat one year if not per results    counseled on breast self exam, STD prevention, HIV risk factors and prevention, adequate intake of calcium and vitamin D, diet and exercise  return annually or prn  An After Visit Summary was printed and given to the patient.

## 2014-10-13 NOTE — Progress Notes (Signed)
Reviewed personally.  M. Suzanne Ryker [place], MD.  

## 2014-10-14 LAB — WET PREP BY MOLECULAR PROBE
CANDIDA SPECIES: NEGATIVE
GARDNERELLA VAGINALIS: NEGATIVE
Trichomonas vaginosis: NEGATIVE

## 2014-10-14 LAB — HIV ANTIBODY (ROUTINE TESTING W REFLEX): HIV 1&2 Ab, 4th Generation: NONREACTIVE

## 2014-10-14 LAB — RPR

## 2014-10-16 ENCOUNTER — Other Ambulatory Visit: Payer: Self-pay

## 2014-10-16 NOTE — Telephone Encounter (Signed)
Medication refill request: Valtrex Last AEX:  10/13/14 with DL Next AEX: 02/23/08 with DL Last MMG (if hormonal medication request):  Refill authorized: please advise

## 2014-10-17 LAB — IPS N GONORRHOEA AND CHLAMYDIA BY PCR

## 2014-10-17 MED ORDER — VALACYCLOVIR HCL 500 MG PO TABS: ORAL_TABLET | ORAL | Status: DC

## 2014-10-18 ENCOUNTER — Telehealth: Payer: Self-pay | Admitting: *Deleted

## 2014-10-18 MED ORDER — VALACYCLOVIR HCL 500 MG PO TABS: ORAL_TABLET | ORAL | Status: DC

## 2014-10-18 NOTE — Telephone Encounter (Signed)
Faxed Medication refill request from [facility]: Valacyclovir  Last AEX:  10/13/14 with DL  Next AEX: 02/22/1094 with DL  Last MMG (if hormonal medication request): n/a Refill authorized: #90/3 rfs   10/17/14 #30/12 rfs was sent to [facility], valtrex #90/3 rfs sent to [facility].  Routed to provider for review, encounter closed.

## 2014-10-20 ENCOUNTER — Other Ambulatory Visit: Payer: Self-pay | Admitting: Certified Nurse Midwife

## 2014-10-20 DIAGNOSIS — R87810 Cervical high risk human papillomavirus (HPV) DNA test positive: Principal | ICD-10-CM

## 2014-10-20 DIAGNOSIS — R87610 Atypical squamous cells of undetermined significance on cytologic smear of cervix (ASC-US): Secondary | ICD-10-CM

## 2014-10-20 LAB — IPS PAP TEST WITH HPV

## 2014-10-24 ENCOUNTER — Telehealth: Payer: Self-pay | Admitting: Emergency Medicine

## 2014-10-24 NOTE — Telephone Encounter (Signed)
Message left to return call to [place] at [phone].   Mirena IUD for contraception.   Order is placed by Leota Sauers CNM.

## 2014-10-24 NOTE — Telephone Encounter (Signed)
-----   Message from Verner Chol, CNM sent at 10/20/2014 12:06 PM EDT ----- Notify patient that pap smear is ASCUS with positive HPVHR this is improved from the LSIL last year, but needs colposcopy exam again. We discussed this possibility at aex. Order in Please schedule with DL

## 2014-10-27 NOTE — Telephone Encounter (Addendum)
Patient notified of message from Leota Sauers CNM.  She is agreeable to scheduling colposcopy. Scheduled for 11/03/14 at 1400.    Colposcopy pre-procedure instructions given. Discussed menses and need to not have any bleeding on day of appointment, advised to call to reschedule if starts cycle. Patient has Mirena with sporadic cycles.  Make sure to eat a meal and hydrate before appointment.  Advised 800 mg of Motrin PO with food one hour prior to appointment.    Patient verbalized understanding of preprocedure instructions will call to reschedule if will be on menses or has any concerns regarding pregnancy.  Patient is advised she will be contacted with insurance coverage information. cc Lilyan Gilford  Routing to provider for final review. Patient agreeable to disposition. Will close encounter.

## 2014-11-03 ENCOUNTER — Encounter: Payer: Self-pay | Admitting: Certified Nurse Midwife

## 2014-11-03 ENCOUNTER — Ambulatory Visit (INDEPENDENT_AMBULATORY_CARE_PROVIDER_SITE_OTHER): Payer: 59 | Admitting: Certified Nurse Midwife

## 2014-11-03 DIAGNOSIS — R87610 Atypical squamous cells of undetermined significance on cytologic smear of cervix (ASC-US): Secondary | ICD-10-CM

## 2014-11-03 DIAGNOSIS — R87810 Cervical high risk human papillomavirus (HPV) DNA test positive: Secondary | ICD-10-CM | POA: Diagnosis not present

## 2014-11-03 NOTE — Progress Notes (Addendum)
Patient ID: Tammy Giles, female   DOB: 05/14/1985, 29 y.o.   MRN: [id number]  Chief Complaint  Patient presents with  . Colposcopy    //jj    HPI Tammy Giles is a 29 y.o. african [place] female. g0p0 Patient denies pelvic pain or vaginal bleeding. Contraception Mirena IUD  HPI  Indications: Pap smear on 10/13/14 showed: ASCUS with POSITIVE high risk HPV. Previous colposcopy: 06/16/13 with LSIL. Prior cervical treatment: no treatment, expectant management.  Past Medical History  Diagnosis Date  . Lupus   . Migraines   . Irregular periods     has IUD  . 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 Abnormal Pap smear of cervix     LGSIL 2014 & 2015, ASCUS HPV HR+ 2016    Past Surgical History  Procedure Laterality Date  . Wisdom tooth extraction    . Achilles tendon surgery Left 05/24/2014    Procedure: LEFT ACHILLES TENDON REPAIR;  Surgeon: Tarry Kos, MD;  Location: [facility];  Service: Orthopedics;  Laterality: Left;  . Colposcopy      2014 & 2015 LGSIL    Family History  Problem Relation Age of Onset  . Hypertension Mother   . Hypertension Father   . Cancer Father     pancreatic  . Depression Father     Social History Social History  Substance Use Topics  . Smoking status: Never Smoker   . Smokeless tobacco: Never Used  . Alcohol Use: 1.8 - 2.4 oz/week    3-4 Standard drinks or equivalent per week    Allergies  Allergen Reactions  . Other Swelling    SOMETHING IN MIDDLE EASTERN FOOD CAUSES SWELLING    Current Outpatient Prescriptions  Medication Sig Dispense Refill  . folic acid (FOLVITE) 400 MCG tablet Take 400 mcg by mouth at bedtime.    . hydroxychloroquine (PLAQUENIL) 200 MG tablet Take 200 mg by mouth at bedtime.     Marland Kitchen levonorgestrel (MIRENA) 20 MCG/24HR IUD 1 each by Intrauterine route once. Implanted Fall 2013    . SUMAtriptan (IMITREX) 50 MG tablet Take 50 mg by mouth daily as needed for migraine. May repeat in 2 hours if headache  persists or recurs.    . valACYclovir (VALTREX) 500 MG tablet Take one daily, increase to bid x 3 days only at onset of outbreak 90 tablet 3  . EPINEPHrine 0.3 mg/0.3 mL IJ SOAJ injection Inject 0.3 mg into the muscle as needed (allergic reaction).      No current facility-administered medications for this visit.    Review of Systems Review of Systems  Constitutional: Negative.   Genitourinary: Negative for vaginal bleeding, vaginal discharge and vaginal pain.    Blood pressure 100/64, pulse 70, resp. rate 16, height 5' 5.25" (1.657 m), weight 225 lb (102.059 kg), last menstrual period 10/19/2014.  Physical Exam Physical Exam  Constitutional: She is oriented to person, place, and time. She appears well-developed and well-nourished.  Genitourinary: Vagina normal. No vaginal discharge found.    Neurological: She is alert and oriented to person, place, and time.  Skin: Skin is warm and dry.  Psychiatric: She has a normal mood and affect. Her behavior is normal. Judgment and thought content normal.    Data Reviewed Reviewed pap smear results with questions addressed.  Assessment   Abnormal pap smear here for Colposcopy Procedure Details  The risks and benefits of the procedure and Written informed consent obtained.  Speculum placed in vagina and excellent visualization of  cervix achieved, cervix swabbed x 3 with saline solution and  acetic acid solution. IUD string noted in cervical os. Cervix viewed with 3.75,7.5,15# and green filter with aceto white area ? HPV effect at 3 and 12 o'clock. Lugols applied with non staining in same areas. Biopsy taken at 3 and 12 o'clock. ECC obtained. Monsel's applied. No active bleeding noted on speculum removal. Patient tolerated procedure well. Instructions given.  Specimens: 3  Complications: none.     Plan    Specimens labelled and sent to Pathology. Patient will be called with results when reviewed.  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 ECC showed benign endocervical cells. Finding correlates well with pap smear of ASCUS. Patient to be notified. Pap recall for one year.      LEONARD,DEBORAH 11/03/2014, 2:36 PM

## 2014-11-03 NOTE — Progress Notes (Signed)
Hx of 10-13-14 ASCUS HPV HR + 05-25-13 pap LGSIL,colpo 06-16-13 LGSIL pap 05-21-12 LGSIL, colpo 10-13-12 LGSIL Pt took  naproxen at 1:50pm

## 2014-11-03 NOTE — Patient Instructions (Signed)

## 2014-11-03 NOTE — Progress Notes (Signed)
Reviewed personally.  M. Suzanne Miller, MD.  

## 2014-11-07 LAB — IPS OTHER TISSUE BIOPSY

## 2014-11-08 ENCOUNTER — Telehealth: Payer: Self-pay

## 2014-11-08 NOTE — Telephone Encounter (Signed)
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

## 2014-11-08 NOTE — Telephone Encounter (Signed)
Left message to call Kaitlyn at [phone]. 

## 2014-11-10 NOTE — Telephone Encounter (Signed)
Spoke with patient. Advised of message as seen below from [employer] CNM. Patient is agreeable and verbalizes understanding. 08 recall entered.  Routing to provider for final review. Patient agreeable to disposition. Will close encounter.

## 2015-09-17 ENCOUNTER — Telehealth: Payer: Self-pay | Admitting: Certified Nurse Midwife

## 2015-09-17 NOTE — Telephone Encounter (Signed)
LMTCB about canceled appointment °

## 2015-10-19 ENCOUNTER — Ambulatory Visit: Payer: 59 | Admitting: Certified Nurse Midwife

## 2015-11-02 ENCOUNTER — Encounter: Payer: Self-pay | Admitting: Certified Nurse Midwife

## 2015-11-02 ENCOUNTER — Ambulatory Visit: Payer: 59 | Admitting: Certified Nurse Midwife

## 2015-11-02 ENCOUNTER — Telehealth: Payer: Self-pay | Admitting: Certified Nurse Midwife

## 2015-11-02 NOTE — Progress Notes (Deleted)
30 y.o. G0P0000 Single  African American Fe here for annual exam.    No LMP recorded.          Sexually active: {yes no:314532}  The current method of family planning is {contraception:315051}.    Exercising: {yes no:314532}  {types:19826} Smoker:  {YES NO:22349}  Health Maintenance: Pap:  10-13-14 ascus hpv hr +, colpo 11-03-14 CIN1 MMG:  none Colonoscopy:  none BMD:   none TDaP:  2009 Shingles: *** Pneumonia: *** Hep C and HIV: HIV neg 2016 Labs:  Self breast exam:   reports that she has never smoked. She has never used smokeless tobacco. She reports that she drinks about 1.8 - 2.4 oz of alcohol per week . She reports that she does not use drugs.  Past Medical History:  Diagnosis Date  . 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 Irregular periods    has IUD  . Lupus   . Migraines     Past Surgical History:  Procedure Laterality Date  . ACHILLES TENDON SURGERY Left 05/24/2014   Procedure: LEFT ACHILLES TENDON REPAIR;  Surgeon: Tarry KosNaiping M Xu, MD;  Location: [facility];  Service: Orthopedics;  Laterality: Left;  . COLPOSCOPY     2014 & 2015 LGSIL  . WISDOM TOOTH EXTRACTION      Current Outpatient Prescriptions  Medication Sig Dispense Refill  . EPINEPHrine 0.3 mg/0.3 mL IJ SOAJ injection Inject 0.3 mg into the muscle as needed (allergic reaction).     . folic acid (FOLVITE) 400 MCG tablet Take 400 mcg by mouth at bedtime.    . hydroxychloroquine (PLAQUENIL) 200 MG tablet Take 200 mg by mouth at bedtime.     Marland Kitchen. levonorgestrel (MIRENA) 20 MCG/24HR IUD 1 each by Intrauterine route once. Implanted Fall 2013    . SUMAtriptan (IMITREX) 50 MG tablet Take 50 mg by mouth daily as needed for migraine. May repeat in 2 hours if headache persists or recurs.    . valACYclovir (VALTREX) 500 MG tablet Take one daily, increase to bid x 3 days only at onset of outbreak 90 tablet 3   No current  facility-administered medications for this visit.     Family History  Problem Relation Age of Onset  . Hypertension Mother   . Hypertension Father   . Cancer Father     pancreatic  . Depression Father     ROS:  Pertinent items are noted in HPI.  Otherwise, a comprehensive ROS was negative.  Exam:   There were no vitals taken for this visit.   Ht Readings from Last 3 Encounters:  11/03/14 5' 5.25" (1.657 m)  10/13/14 5' 5.25" (1.657 m)  05/24/14 5\' 5"  (1.651 m)    General appearance: alert, cooperative and appears stated age Head: Normocephalic, without obvious abnormality, atraumatic Neck: no adenopathy, supple, symmetrical, trachea midline and thyroid {EXAM; THYROID:18604} Lungs: clear to auscultation bilaterally Breasts: {Exam; breast:13139::"normal appearance, no masses or tenderness"} Heart: regular rate and rhythm Abdomen: soft, non-tender; no masses,  no organomegaly Extremities: extremities normal, atraumatic, no cyanosis or edema Skin: Skin color, texture, turgor normal. No rashes or lesions Lymph nodes: Cervical, supraclavicular, and axillary nodes normal. No abnormal inguinal nodes palpated Neurologic: Grossly normal   Pelvic: External genitalia:  no lesions              Urethra:  normal appearing urethra with no masses, tenderness or lesions  Bartholin's and Skene's: normal                 Vagina: normal appearing vagina with normal color and discharge, no lesions              Cervix: {exam; cervix:14595}              Pap taken: {yes no:314532} Bimanual Exam:  Uterus:  {exam; uterus:12215}              Adnexa: {exam; adnexa:12223}               Rectovaginal: Confirms               Anus:  normal sphincter tone, no lesions  Chaperone present: ***  A:  Well Woman with normal exam  P:   Reviewed health and wellness pertinent to exam  Pap smear as above  {plan; gyn:5269::"mammogram","pap smear","return annually or prn"}  An After Visit Summary  was printed and given to the patient.

## 2015-11-02 NOTE — Telephone Encounter (Signed)
Patient called and cancelled her AEX for today. She called and left a message at lunch. She said, "I need to cancel my appointment for today. Something came up at work and I can't make it. I am aware this not cancelling within 24 hours."   I called the patient and rescheduled her to 11/06/15.  FYI only

## 2015-11-06 ENCOUNTER — Encounter: Payer: Self-pay | Admitting: Certified Nurse Midwife

## 2015-11-06 ENCOUNTER — Ambulatory Visit (INDEPENDENT_AMBULATORY_CARE_PROVIDER_SITE_OTHER): Payer: 59 | Admitting: Certified Nurse Midwife

## 2015-11-06 VITALS — BP 104/62 | HR 70 | Resp 16 | Ht 64.75 in | Wt 230.0 lb

## 2015-11-06 DIAGNOSIS — Z0000 Encounter for general adult medical examination without abnormal findings: Secondary | ICD-10-CM | POA: Diagnosis not present

## 2015-11-06 DIAGNOSIS — Z124 Encounter for screening for malignant neoplasm of cervix: Secondary | ICD-10-CM | POA: Diagnosis not present

## 2015-11-06 DIAGNOSIS — Z01419 Encounter for gynecological examination (general) (routine) without abnormal findings: Secondary | ICD-10-CM | POA: Diagnosis not present

## 2015-11-06 DIAGNOSIS — Z30431 Encounter for routine checking of intrauterine contraceptive device: Secondary | ICD-10-CM | POA: Diagnosis not present

## 2015-11-06 NOTE — Progress Notes (Signed)
30 y.o. G0P0000 Single  African American Fe here for annual exam. Spotting only with Mirena IUD use, working well. Condoms use for STD protection. Partner change, desires STD screening.  Working on weight loss and exercise. Has noted palpitations when stressed and exercise is helping with this. No SOB or chest or jaw pain when occurs. No other health issues today.  Patient's last menstrual period was 10/19/2015 (exact date).          Sexually active: Yes.    The current method of family planning is iud & condoms.    Exercising: Yes.    walking & weights Smoker:  no  Health Maintenance: Pap:  10-13-14 ASCUS HPV HR +, colpo 11-03-14 CIN1 MMG:  none Colonoscopy:  none BMD:   none TDaP:  2009 Shingles: no Pneumonia: no Hep C and HIV: HIV neg 2016 Labs: none Self breast exam: done occ   reports that she has never smoked. She has never used smokeless tobacco. She reports that she drinks about 1.2 oz of alcohol per week . She reports that she does not use drugs.  Past Medical History:  Diagnosis Date  . 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 Irregular periods    has IUD  . Lupus (HCC)   . Migraines     Past Surgical History:  Procedure Laterality Date  . ACHILLES TENDON SURGERY Left 05/24/2014   Procedure: LEFT ACHILLES TENDON REPAIR;  Surgeon: Tarry KosNaiping M Xu, MD;  Location: [facility];  Service: Orthopedics;  Laterality: Left;  . COLPOSCOPY     2014 & 2015 LGSIL  . WISDOM TOOTH EXTRACTION      Current Outpatient Prescriptions  Medication Sig Dispense Refill  . hydroxychloroquine (PLAQUENIL) 200 MG tablet Take 200 mg by mouth at bedtime.     Marland Kitchen. levonorgestrel (MIRENA) 20 MCG/24HR IUD 1 each by Intrauterine route once. Implanted Fall 2013    . Multiple Vitamins-Minerals (MULTIVITAMIN PO) Take by mouth daily.    . Probiotic Product (PROBIOTIC PO) Take by mouth.    . SUMAtriptan (IMITREX) 50 MG tablet  Take 50 mg by mouth daily as needed for migraine. May repeat in 2 hours if headache persists or recurs.    . valACYclovir (VALTREX) 500 MG tablet Take one daily, increase to bid x 3 days only at onset of outbreak 90 tablet 3  . EPINEPHrine 0.3 mg/0.3 mL IJ SOAJ injection Inject 0.3 mg into the muscle as needed (allergic reaction).      No current facility-administered medications for this visit.     Family History  Problem Relation Age of Onset  . Hypertension Mother   . Hypertension Father   . Cancer Father     pancreatic  . Depression Father     ROS:  Pertinent items are noted in HPI.  Otherwise, a comprehensive ROS was negative.  Exam:   BP 104/62   Pulse 70   Resp 16   Ht 5' 4.75" (1.645 m)   Wt 230 lb (104.3 kg)   LMP 10/19/2015 (Exact Date)   BMI 38.57 kg/m  Height: 5' 4.75" (164.5 cm) Ht Readings from Last 3 Encounters:  11/06/15 5' 4.75" (1.645 m)  11/03/14 5' 5.25" (1.657 m)  10/13/14 5' 5.25" (1.657 m)    General appearance: alert, cooperative and appears stated age Head: Normocephalic, without obvious abnormality, atraumatic Neck: no adenopathy, supple, symmetrical, trachea midline and  thyroid normal to inspection and palpation Lungs: clear to auscultation bilaterally Breasts: normal appearance, no masses or tenderness, No nipple retraction or dimpling, No nipple discharge or bleeding, No axillary or supraclavicular adenopathy Heart: regular rate and rhythm Abdomen: soft, non-tender; no masses,  no organomegaly Extremities: extremities normal, atraumatic, no cyanosis or edema Skin: Skin color, texture, turgor normal. No rashes or lesions Lymph nodes: Cervical, supraclavicular, and axillary nodes normal. No abnormal inguinal nodes palpated Neurologic: Grossly normal   Pelvic: External genitalia:  no lesions              Urethra:  normal appearing urethra with no masses, tenderness or lesions              Bartholin's and Skene's: normal                  Vagina: normal appearing vagina with normal color and discharge, no lesions              Cervix: no bleeding following Pap, no cervical motion tenderness, no lesions and nulliparous appearance              Pap taken: Yes.   Bimanual Exam:  Uterus:  normal size, contour, position, consistency, mobility, non-tender              Adnexa: normal adnexa and no mass, fullness, tenderness               Rectovaginal: Confirms               Anus:  normal appearance  Chaperone present: yes  A:  Well Woman with normal exam  Contraception Mirena IUD  STD screening  Palpitations with stress episodes  Abnormal pap smear ASCUS + HPVHR with colpo showing LSIL    P:   Reviewed health and wellness pertinent to exam  Reviewed warning signs with IUD and need to advise.  Labs: HIV,RPR, Gc,Chlamydia vaginal and oral, affirm  Discussed concerns with palpitations and signs and symptoms if occurs needs to be evaluated in ER. Patient aware and understands concerns. If continues needs to see PCP or Cardiology evaluation.  Pap smear as above with HPVHR. If negative repeat in one year, if not per results.   counseled on breast self exam, STD prevention, HIV risk factors and prevention, adequate intake of calcium and vitamin D, diet and exercise  return annually or prn  An After Visit Summary was printed and given to the patient.

## 2015-11-06 NOTE — Patient Instructions (Signed)

## 2015-11-07 LAB — WET PREP BY MOLECULAR PROBE
Candida species: NEGATIVE
Gardnerella vaginalis: NEGATIVE
TRICHOMONAS VAG: NEGATIVE

## 2015-11-08 ENCOUNTER — Other Ambulatory Visit: Payer: Self-pay | Admitting: Certified Nurse Midwife

## 2015-11-08 LAB — GONOCOCCUS CULTURE

## 2015-11-08 LAB — IPS N GONORRHOEA AND CHLAMYDIA BY PCR

## 2015-11-08 NOTE — Telephone Encounter (Signed)
Medication refill request: Valacyclovir Last AEX:  11/06/15 DL Next AEX: 1/61/099/21/18 DL Last MMG (if hormonal medication request): n/a Refill authorized: 10/18/14 #90 3R. Please advise. Thank you.

## 2015-11-09 LAB — IPS PAP TEST WITH HPV

## 2015-11-09 NOTE — Telephone Encounter (Signed)
Script sent  

## 2015-11-11 NOTE — Progress Notes (Signed)
Encounter reviewed River Mckercher, MD   

## 2015-11-12 LAB — CHLAMYDIA CULTURE

## 2015-11-13 ENCOUNTER — Telehealth: Payer: Self-pay

## 2015-11-13 DIAGNOSIS — R87810 Cervical high risk human papillomavirus (HPV) DNA test positive: Principal | ICD-10-CM

## 2015-11-13 DIAGNOSIS — R87610 Atypical squamous cells of undetermined significance on cytologic smear of cervix (ASC-US): Secondary | ICD-10-CM

## 2015-11-13 NOTE — Telephone Encounter (Signed)
Spoke with patient. Advised of all results as seen below. Patient is agreeable and verbalizes understanding. Patient has a Mirena IUD in place. Colposcopy appointment scheduled for 12/07/2015 at 10 am with [name] CNM. Patient is agreeable to date and time.  Instructions given. Motrin 800 mg po x , one hour before appointment with food. Make sure to eat a meal before appointment and drink plenty of fluids. Patient verbalized understanding and will call to reschedule if will be on menses or has any concerns regarding pregnancy. Patient agreeable and verbalized understanding of all instructions. Order placed for precert. Patient is scheduled for additional STD lab testing on 11/16/2015, but is unable to make this appointment. She would like to have these labs down at her colposcopy appointment on 12/07/2015. Note added to her appointment.  Notes Recorded by Verner Choleborah S Leonard, CNM on 11/13/2015 at 7:52 AM EDT Notify patient Chlamydia oral culture is negative  Routing to provider for final review. Patient agreeable to disposition. Will close encounter.

## 2015-11-13 NOTE — Telephone Encounter (Signed)
-----   Message from Romualdo BolkJill Evelyn Jertson, MD sent at 11/09/2015 12:58 PM EDT ----- Patient with ASCUS/+HPV pap. She needs a colpo with Debbie, cervical cultures were negative

## 2015-11-15 ENCOUNTER — Telehealth: Payer: Self-pay | Admitting: Certified Nurse Midwife

## 2015-11-15 NOTE — Telephone Encounter (Signed)
Called patient to review benefits for a recommended procedure. Left Voicemail requesting a call back. °

## 2015-11-16 ENCOUNTER — Other Ambulatory Visit: Payer: 59

## 2015-11-25 ENCOUNTER — Other Ambulatory Visit: Payer: Self-pay | Admitting: Certified Nurse Midwife

## 2015-11-26 ENCOUNTER — Ambulatory Visit (INDEPENDENT_AMBULATORY_CARE_PROVIDER_SITE_OTHER): Payer: 59 | Admitting: Obstetrics and Gynecology

## 2015-11-26 ENCOUNTER — Telehealth: Payer: Self-pay | Admitting: Certified Nurse Midwife

## 2015-11-26 ENCOUNTER — Encounter: Payer: Self-pay | Admitting: Obstetrics and Gynecology

## 2015-11-26 VITALS — BP 104/68 | HR 70 | Temp 98.1°F | Resp 16 | Ht 64.75 in | Wt 229.0 lb

## 2015-11-26 DIAGNOSIS — Z30431 Encounter for routine checking of intrauterine contraceptive device: Secondary | ICD-10-CM

## 2015-11-26 DIAGNOSIS — R102 Pelvic and perineal pain: Secondary | ICD-10-CM

## 2015-11-26 LAB — POCT URINALYSIS DIPSTICK
BILIRUBIN UA: NEGATIVE
Glucose, UA: NEGATIVE
Ketones, UA: NEGATIVE
Leukocytes, UA: NEGATIVE
Nitrite, UA: NEGATIVE
Protein, UA: NEGATIVE
RBC UA: NEGATIVE
Urobilinogen, UA: NEGATIVE
pH, UA: 5

## 2015-11-26 NOTE — Telephone Encounter (Signed)
Patient called and said, "I need an appointment. I can't feel my IUD string anymore. I also feel some pressure and discomfort down there." Patient aware a nurse will call her to assess her further.  Last seen 11/06/15

## 2015-11-26 NOTE — Progress Notes (Signed)
GYNECOLOGY  VISIT   HPI: 30 y.o.   Single  African American  female   G0P0000 with Patient's last menstrual period was 11/18/2015.   here for pressure with urination, pt has iud  Unable to feel the IUD stings and became concerned. IUD was placed 4 years ago.  Has spotting only.  Some left sided pelvic pressure and pressure with voiding.   Had a new female partner this weekend, and did not use condoms.  GC/CT both negative 11/06/15.  Has colposcopy due on 12/07/15 with Sara Chu.  Will do some blood work at that time as well.   Urine dip - negative.   GYNECOLOGIC HISTORY: Patient's last menstrual period was 11/18/2015. Contraception: Mirena iud Menopausal hormone therapy:  N/a Last mammogram:  n/a Last pap smear:   ASCUS HPV HR +, colpo scheduled for 12-07-15  Poct urine-neg     OB History    Gravida Para Term Preterm AB Living   0 0 0 0 0 0   SAB TAB Ectopic Multiple Live Births   0 0 0 0           Patient Active Problem List   Diagnosis Date Noted  . Abnormal Pap smear of cervix 06/16/2013    Class: History of  . Lupus 10/22/2011    Past Medical History:  Diagnosis Date  . 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 Irregular periods    has IUD  . Lupus   . Migraines     Past Surgical History:  Procedure Laterality Date  . ACHILLES TENDON SURGERY Left 05/24/2014   Procedure: LEFT ACHILLES TENDON REPAIR;  Surgeon: Tarry Kos, MD;  Location: [facility];  Service: Orthopedics;  Laterality: Left;  . COLPOSCOPY     2014 & 2015 LGSIL  . INTRAUTERINE DEVICE (IUD) INSERTION     removal due 2018  . WISDOM TOOTH EXTRACTION      Current Outpatient Prescriptions  Medication Sig Dispense Refill  . B Complex Vitamins (B COMPLEX PO) Take by mouth daily.    Marland Kitchen EPINEPHrine 0.3 mg/0.3 mL IJ SOAJ injection Inject 0.3 mg into the muscle as needed (allergic reaction).     .  hydroxychloroquine (PLAQUENIL) 200 MG tablet Take 200 mg by mouth at bedtime.     . IRON PO Take by mouth daily.    Marland Kitchen levonorgestrel (MIRENA) 20 MCG/24HR IUD 1 each by Intrauterine route once. Implanted Fall 2013    . Multiple Vitamins-Minerals (MULTIVITAMIN PO) Take by mouth daily.    . Probiotic Product (PROBIOTIC PO) Take by mouth.    . SUMAtriptan (IMITREX) 50 MG tablet Take 50 mg by mouth daily as needed for migraine. May repeat in 2 hours if headache persists or recurs.    . valACYclovir (VALTREX) 500 MG tablet TAKE 1 TABLET BY MOUTH  DAILY; INCREASE TO TWO  TIMES DAILY FOR THREE DAYS  ONLY AT ONSET OF OUTBREAK 90 tablet 3   No current facility-administered medications for this visit.      ALLERGIES: Other  Family History  Problem Relation Age of Onset  . Hypertension Mother   . Hypertension Father   . Cancer Father     pancreatic  . Depression Father     Social History   Social History  . Marital status: Single    Spouse name: N/A  . Number of children: 0  .  Years of education: N/A   Occupational History  .  [employer]   Social History Main Topics  . Smoking status: Never Smoker  . Smokeless tobacco: Never Used  . Alcohol use 1.2 oz/week    2 Standard drinks or equivalent per week  . Drug use: No  . Sexual activity: Yes    Partners: Male    Birth control/ protection: IUD, Condom     Comment: mirena inserted 06/06/2011   Other Topics Concern  . Not on file   Social History Narrative  . No narrative on file    ROS:  Pertinent items are noted in HPI.  PHYSICAL EXAMINATION:    BP 104/68   Pulse 70   Temp 98.1 F (36.7 C) (Oral)   Resp 16   Ht 5' 4.75" (1.645 m)   Wt 229 lb (103.9 kg)   LMP 11/18/2015   BMI 38.40 kg/m     General appearance: alert, cooperative and appears stated age  Pelvic: External genitalia:  no lesions              Urethra:  normal appearing urethra with no masses, tenderness or lesions              Bartholins and  Skenes: normal                 Vagina: normal appearing vagina with normal color and discharge, no lesions              Cervix: no lesions.  IUD stings seen.  Irregular contour to anterior cervical lip.                 Bimanual Exam:  Uterus:  normal size, contour, position, consistency, mobility, non-tender              Adnexa: no mass, fullness, tenderness             Chaperone was present for exam.  ASSESSMENT  ASCUS positive HR HPV pap.  Pelvic pressure.  Urine dip negative.  Mirena IUD.  PLAN  Reassurance given regarding IUD position.  Discussed the importance of condom use, especially with an IUD.   Follow up for colposcopy appointment with Debbie.   Can do repeat STD testing at that time.    An After Visit Summary was printed and given to the patient.  _15_____ minutes face to face time of which over 50% was spent in counseling.

## 2015-11-26 NOTE — Telephone Encounter (Signed)
Returned call to patient. Patient states that when she woke up this morning and went to the bathroom, she felt a lot of pressure "and that is a new thing." Patient states she attempted to feel for her IUD strings, but was unable to feel them. She states my "stomach feels weird, so I figured I should call." Office visit offered, and patient states she would prefer to be seen this morning due to having another appointment this afternoon. Patient aware Leota SauersDeborah Leonard, CNM is out of the office today, but is open to being seen by another provider. Office visit scheduled for 11/26/15 at 1030 with Dr. Edward JollySilva. Patient agreeable to date and time and aware to arrive 15 minutes early for appointment.    Routing to provider for final review. Patient agreeable to disposition. Will close encounter.

## 2015-11-26 NOTE — Telephone Encounter (Signed)
Medication refill request: Valtrex  Last AEX:  11/06/15 DL Next AEX: 0/27/259/21/18 DL Last MMG (if hormonal medication request): None Refill authorized: 11/09/15 #[id number]/3R [facility].   Request coming from [facility]. Rx sent as prescribed.

## 2015-12-07 ENCOUNTER — Ambulatory Visit (INDEPENDENT_AMBULATORY_CARE_PROVIDER_SITE_OTHER): Payer: 59 | Admitting: Certified Nurse Midwife

## 2015-12-07 ENCOUNTER — Encounter: Payer: Self-pay | Admitting: Certified Nurse Midwife

## 2015-12-07 VITALS — BP 102/70 | HR 70 | Resp 16 | Ht 64.75 in | Wt 236.0 lb

## 2015-12-07 DIAGNOSIS — R87810 Cervical high risk human papillomavirus (HPV) DNA test positive: Secondary | ICD-10-CM | POA: Diagnosis not present

## 2015-12-07 DIAGNOSIS — R87610 Atypical squamous cells of undetermined significance on cytologic smear of cervix (ASC-US): Secondary | ICD-10-CM

## 2015-12-07 DIAGNOSIS — Z0000 Encounter for general adult medical examination without abnormal findings: Secondary | ICD-10-CM

## 2015-12-07 DIAGNOSIS — Z9889 Other specified postprocedural states: Secondary | ICD-10-CM

## 2015-12-07 NOTE — Patient Instructions (Signed)

## 2015-12-07 NOTE — Progress Notes (Signed)
ASCUS HPV HR+ on pap 11-06-15 Previous abnormal hx with LGSIL & colpo. pt took 800mg  ibuprofen at 10am.

## 2015-12-07 NOTE — Progress Notes (Signed)
Patient ID: Tammy Giles, female   DOB: 01/25/1986, 30 y.o.   MRN: [id number]  Chief Complaint  Patient presents with  . Colposcopy    //jj    HPI Tammy Giles is a [age] y.o. gopo african [place]  female here for colposcopy exam. Denies vaginal bleeding or vaginal pain. Contraception HPI  Indications: Pap smear on 9/19 2017 showed: ASCUS with POSITIVE high risk HPV. Previous colposcopy: 11/03/14 showed LSIL, ECC negative  . Prior cervical treatment: expectant management.  Past Medical History:  Diagnosis Date  . Abnormal Pap smear of cervix    LGSIL 2014 & 2015, ASCUS HPV HR+ 2016, ASCUS HPV HR+ 2017  . 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 Irregular periods    has IUD  . Lupus   . Migraines     Past Surgical History:  Procedure Laterality Date  . ACHILLES TENDON SURGERY Left 05/24/2014   Procedure: LEFT ACHILLES TENDON REPAIR;  Surgeon: Tarry KosNaiping M Xu, MD;  Location: [facility];  Service: Orthopedics;  Laterality: Left;  . COLPOSCOPY     2014 & 2015 LGSIL  . INTRAUTERINE DEVICE (IUD) INSERTION     removal due 2018  . WISDOM TOOTH EXTRACTION      Family History  Problem Relation Age of Onset  . Hypertension Mother   . Hypertension Father   . Cancer Father     pancreatic  . Depression Father     Social History Social History  Substance Use Topics  . Smoking status: Never Smoker  . Smokeless tobacco: Never Used  . Alcohol use 1.2 oz/week    2 Standard drinks or equivalent per week    Allergies  Allergen Reactions  . Other Swelling    SOMETHING IN MIDDLE EASTERN FOOD CAUSES SWELLING    Current Outpatient Prescriptions  Medication Sig Dispense Refill  . B Complex Vitamins (B COMPLEX PO) Take by mouth daily.    . hydroxychloroquine (PLAQUENIL) 200 MG tablet Take 200 mg by mouth at bedtime.     . IRON PO Take by mouth daily.    Marland Kitchen. levonorgestrel (MIRENA) 20 MCG/24HR IUD 1 each by Intrauterine route once. Implanted Fall 2013    . Multiple  Vitamins-Minerals (MULTIVITAMIN PO) Take by mouth daily.    . Probiotic Product (PROBIOTIC PO) Take by mouth.    . SUMAtriptan (IMITREX) 50 MG tablet Take 50 mg by mouth daily as needed for migraine. May repeat in 2 hours if headache persists or recurs.    . valACYclovir (VALTREX) 500 MG tablet TAKE 1 TABLET BY MOUTH  DAILY; INCREASE TO TWO  TIMES DAILY FOR THREE DAYS  ONLY AT ONSET OF OUTBREAK 90 tablet 3  . EPINEPHrine 0.3 mg/0.3 mL IJ SOAJ injection Inject 0.3 mg into the muscle as needed (allergic reaction).      No current facility-administered medications for this visit.     Review of Systems Review of Systems  Constitutional: Negative.   Gastrointestinal: Negative for abdominal pain.  Genitourinary: Negative for vaginal bleeding, vaginal discharge and vaginal pain.    Blood pressure 102/70, pulse 70, resp. rate 16, height 5' 4.75" (1.645 m), weight 236 lb (107 kg), last menstrual period 11/18/2015.  Physical Exam Physical Exam  Constitutional: She appears well-developed and well-nourished.  Genitourinary: Vagina normal. There is no rash, tenderness or lesion on the right labia. There is no rash, tenderness or lesion on the left labia. Cervix exhibits no motion tenderness and no discharge. No bleeding in the vagina.  Neurological: She is alert.  Skin: Skin is warm and dry.  Psychiatric: She has a normal mood and affect. Her behavior is normal. Judgment and thought content normal.    Data Reviewed Reviewed pap smear results and consent form. Questions addressed.  Assessment    Procedure Details  The risks and benefits of the procedure and Written informed consent obtained.  Speculum placed in vagina and excellent visualization of cervix achieved, cervix swabbed x 3 with saline and  acetic acid solution. IUD string noted in cervix.. Cervix viewed with 3.75,7.5 and 15 # with green filter. Aceto white area noted at 7 and 12  O'clock. Biopsy taken from these areas. ECC taken  with not problems with IUD string presence. Monsel's applied. No active bleeding noted on removal of speculum. Patient tolerated procedure well . Instructions given for care.  Specimens: 3  Complications: none.     Plan    Specimens labelled and sent to Pathology. Patient will be notified of results once reviewed.. 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 ECC showed benign endocervical material, no malignancy or dysplasia.  Agrees well with pap smear finding. Patient will be called with results and need to follow up with pap smear in one year. Pap recall placed.  Rv prn  Labs Drawn for HIV, RPR per patient request      [facility] 12/07/2015, 10:35 AM

## 2015-12-08 LAB — RPR

## 2015-12-08 LAB — HIV ANTIBODY (ROUTINE TESTING W REFLEX): HIV 1&2 Ab, 4th Generation: NONREACTIVE

## 2015-12-11 DIAGNOSIS — Z0000 Encounter for general adult medical examination without abnormal findings: Secondary | ICD-10-CM | POA: Insufficient documentation

## 2015-12-11 DIAGNOSIS — G43001 Migraine without aura, not intractable, with status migrainosus: Secondary | ICD-10-CM | POA: Insufficient documentation

## 2015-12-11 DIAGNOSIS — E669 Obesity, unspecified: Secondary | ICD-10-CM | POA: Insufficient documentation

## 2015-12-11 LAB — IPS OTHER TISSUE BIOPSY

## 2015-12-12 ENCOUNTER — Telehealth: Payer: Self-pay

## 2015-12-12 DIAGNOSIS — E785 Hyperlipidemia, unspecified: Secondary | ICD-10-CM | POA: Insufficient documentation

## 2015-12-12 DIAGNOSIS — E559 Vitamin D deficiency, unspecified: Secondary | ICD-10-CM | POA: Insufficient documentation

## 2015-12-12 NOTE — Telephone Encounter (Signed)
Spoke with patient. Advised of results and message as seen below from PepsiCoDeborah Leonard CNM. Patient is agreeable and verbalizes understanding. Next aex is scheduled for 11/07/2016. 08 recall placed.  Routing to provider for final review. Patient agreeable to disposition. Will close encounter.

## 2015-12-12 NOTE — Telephone Encounter (Signed)
-----   Message from Verner Choleborah S Leonard, CNM sent at 12/12/2015 11:20 AM EDT ----- Notify patient that cervical biopsy at 7 and 12 o'clock showed LSIL including HPV related changes as we suspected agrees well with pap findings ECC showed benign endocervical elements, no dysplasia or malignancy Needs repeat pap in one year, real important to do follow up Pap recall 08

## 2015-12-14 NOTE — Progress Notes (Signed)
Encounter reviewed Merlon Alcorta, MD   

## 2016-01-02 IMAGING — CR DG ANKLE COMPLETE 3+V*L*
3 series · 3 of 3 positions shown · non-contrast
Comparison: None.

CLINICAL DATA: Left ankle injury playing basketball

EXAM:
LEFT ANKLE COMPLETE - 3+ VIEW

[x ankle ap left]
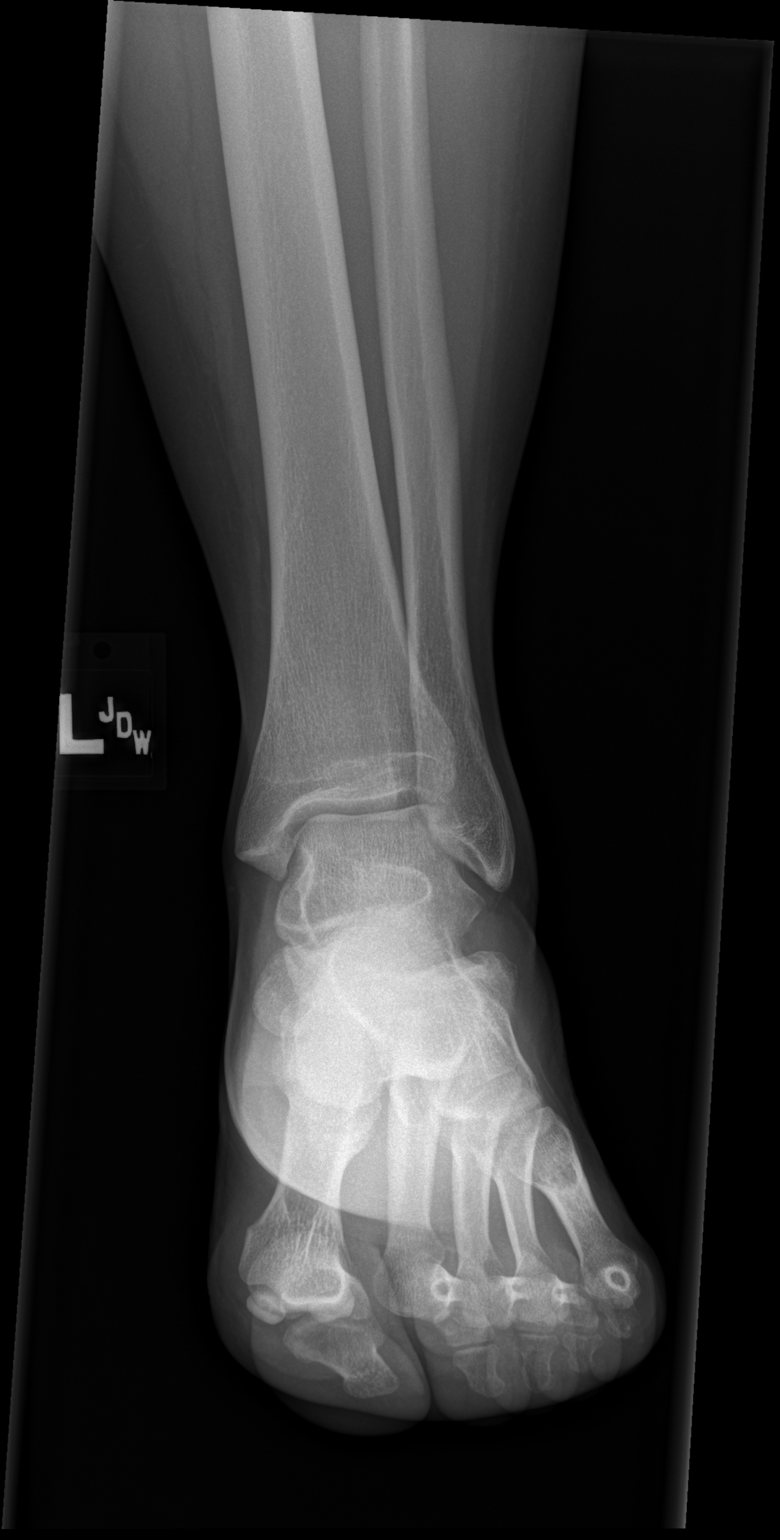

[x ankle obl left]
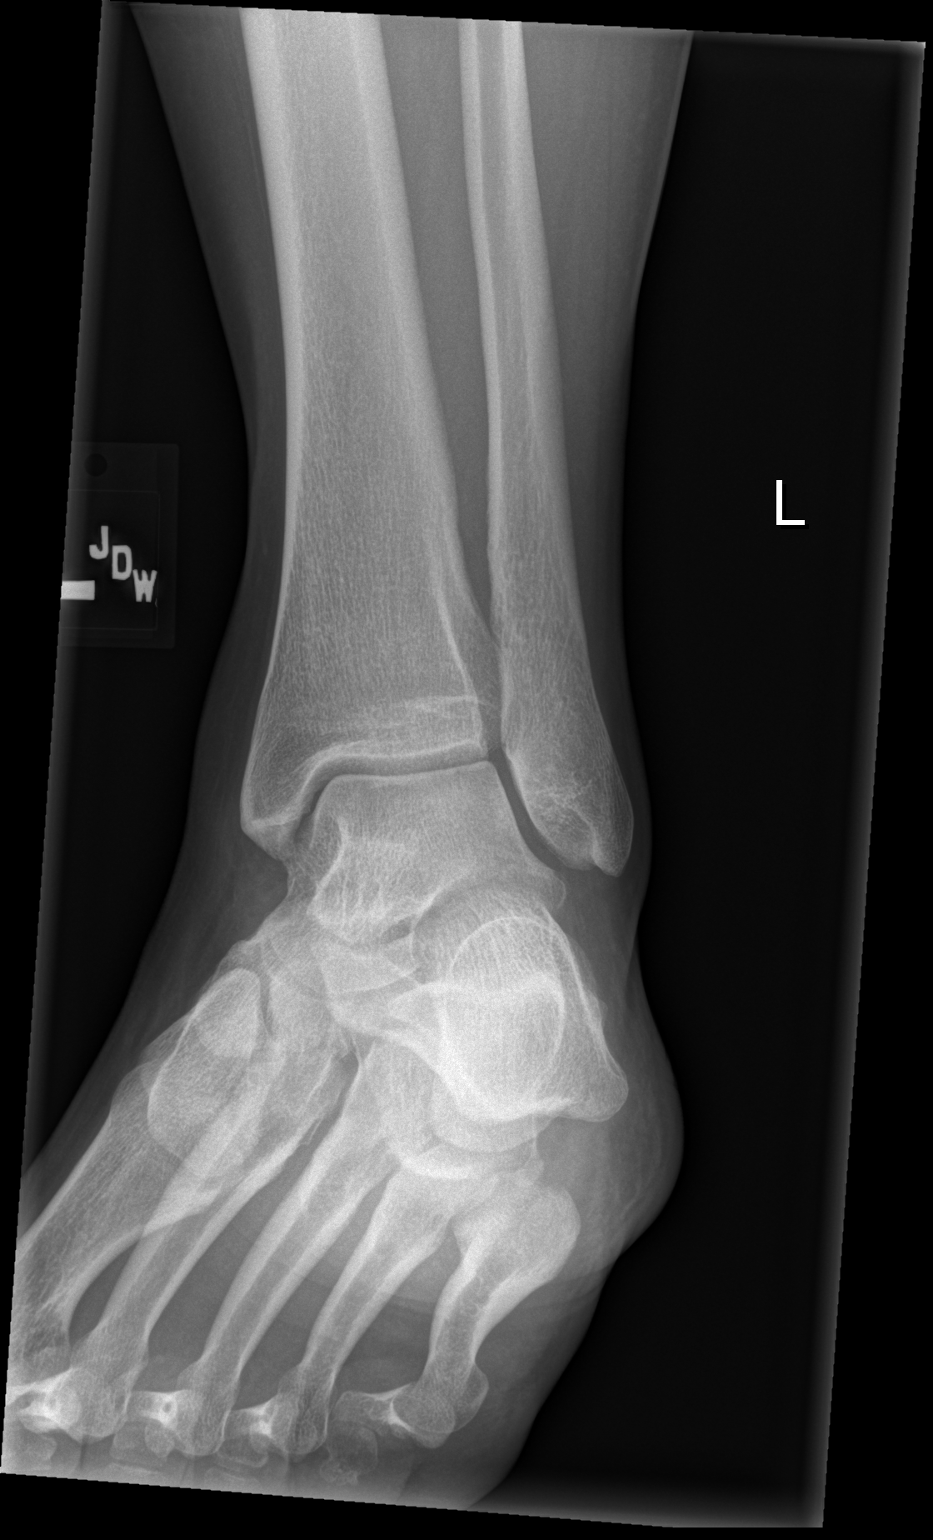

[x ankle lat left]
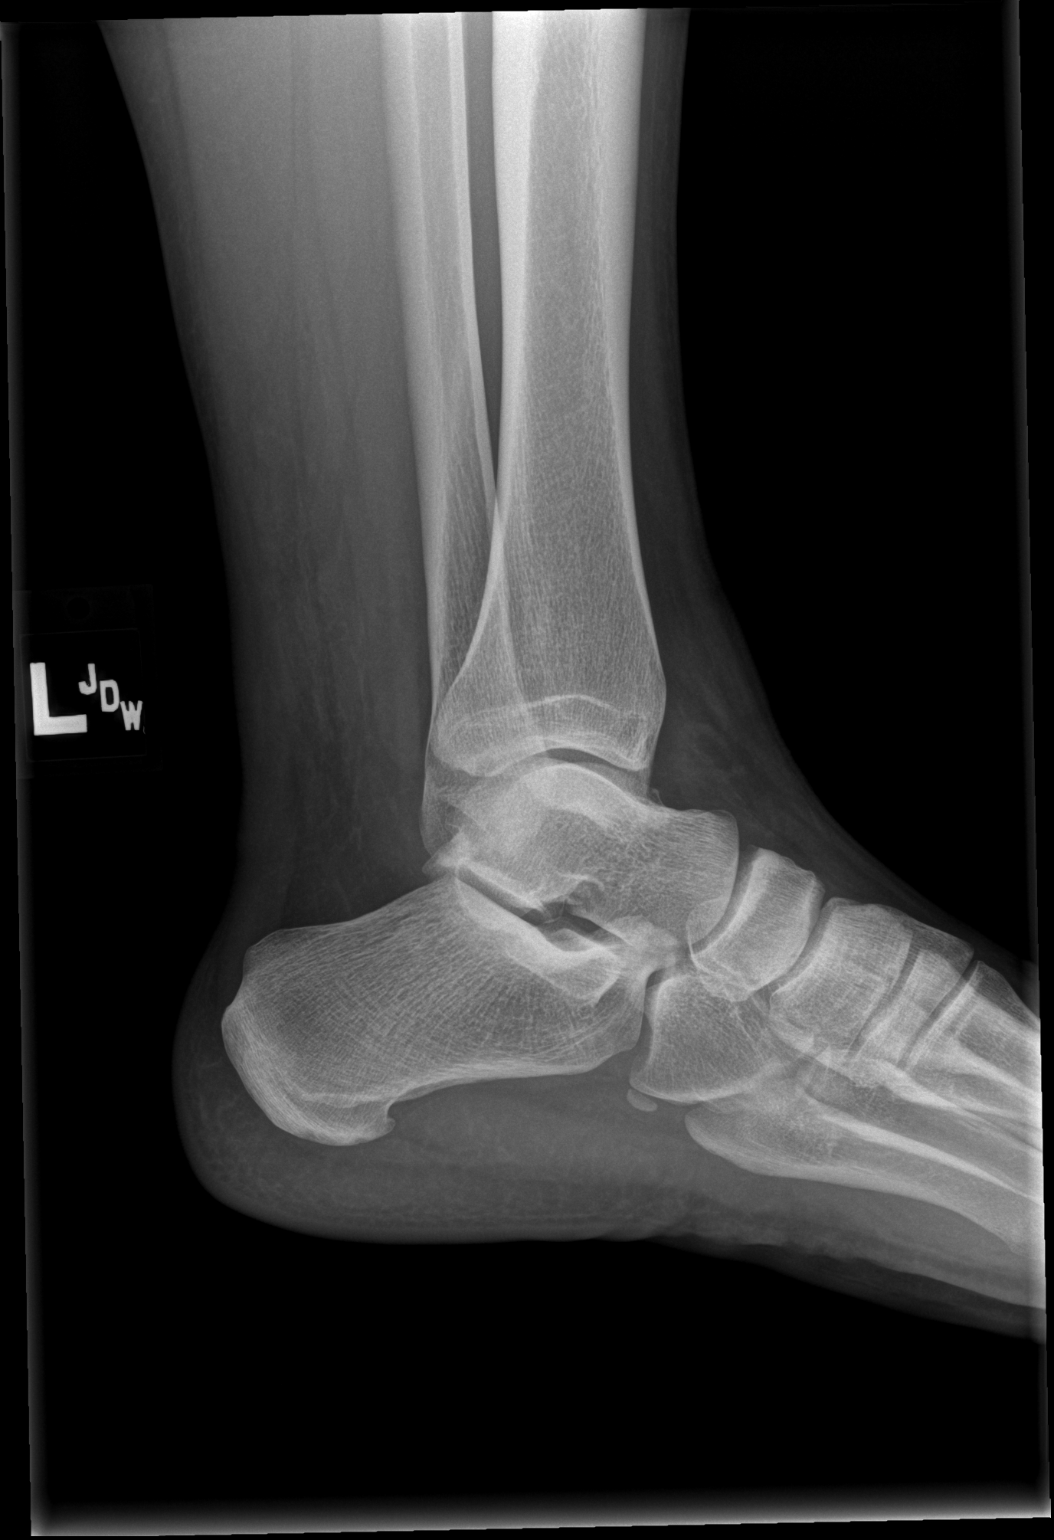

[3 of 3 positions shown; findings below may reference images not displayed]

FINDINGS: Three views of left ankle submitted. No acute fracture or
subluxation. Tiny plantar spur of calcaneus. Ankle mortise is
preserved.
IMPRESSION: No acute fracture or subluxation.  Tiny plantar spur of calcaneus.

## 2016-07-03 ENCOUNTER — Encounter: Payer: Self-pay | Admitting: Certified Nurse Midwife

## 2016-07-03 ENCOUNTER — Ambulatory Visit (INDEPENDENT_AMBULATORY_CARE_PROVIDER_SITE_OTHER): Payer: 59 | Admitting: Certified Nurse Midwife

## 2016-07-03 VITALS — BP 110/68 | HR 64 | Temp 99.1°F | Resp 16 | Ht 64.75 in | Wt 229.0 lb

## 2016-07-03 DIAGNOSIS — Z3009 Encounter for other general counseling and advice on contraception: Secondary | ICD-10-CM

## 2016-07-03 DIAGNOSIS — Z30433 Encounter for removal and reinsertion of intrauterine contraceptive device: Secondary | ICD-10-CM | POA: Diagnosis not present

## 2016-07-03 DIAGNOSIS — N912 Amenorrhea, unspecified: Secondary | ICD-10-CM | POA: Diagnosis not present

## 2016-07-03 DIAGNOSIS — N390 Urinary tract infection, site not specified: Secondary | ICD-10-CM

## 2016-07-03 DIAGNOSIS — B373 Candidiasis of vulva and vagina: Secondary | ICD-10-CM

## 2016-07-03 DIAGNOSIS — B3731 Acute candidiasis of vulva and vagina: Secondary | ICD-10-CM

## 2016-07-03 LAB — POCT URINALYSIS DIPSTICK
Bilirubin, UA: NEGATIVE
Blood, UA: NEGATIVE
GLUCOSE UA: NEGATIVE
Ketones, UA: NEGATIVE
Nitrite, UA: NEGATIVE
Protein, UA: NEGATIVE
Urobilinogen, UA: NEGATIVE E.U./dL — AB
pH, UA: 5 (ref 5.0–8.0)

## 2016-07-03 LAB — POCT URINE PREGNANCY: PREG TEST UR: NEGATIVE

## 2016-07-03 MED ORDER — FLUCONAZOLE 150 MG PO TABS: 150.0000 mg | ORAL_TABLET | Freq: Once | ORAL | 0 refills | Status: AC

## 2016-07-03 NOTE — Addendum Note (Signed)
Addended by: Verner CholLEONARD, Latorria Zeoli S on: 07/03/2016 12:46 PM   Modules accepted: Orders

## 2016-07-03 NOTE — Patient Instructions (Addendum)
Urinary Tract Infection, Adult A urinary tract infection (UTI) is an infection of any part of the urinary tract. The urinary tract includes the:  Kidneys.  Ureters.  Bladder.  Urethra. These organs make, store, and get rid of pee (urine) in the body. Follow these instructions at home:  Take over-the-counter and prescription medicines only as told by your doctor.  If you were prescribed an antibiotic medicine, take it as told by your doctor. Do not stop taking the antibiotic even if you start to feel better.  Avoid the following drinks:  Alcohol.  Caffeine.  Tea.  Carbonated drinks.  Drink enough fluid to keep your pee clear or pale yellow.  Keep all follow-up visits as told by your doctor. This is important.  Make sure to:  Empty your bladder often and completely. Do not to hold pee for long periods of time.  Empty your bladder before and after sex.  Wipe from front to back after a bowel movement if you are female. Use each tissue one time when you wipe. Contact a doctor if:  You have back pain.  You have a fever.  You feel sick to your stomach (nauseous).  You throw up (vomit).  Your symptoms do not get better after 3 days.  Your symptoms go away and then come back. Get help right away if:  You have very bad back pain.  You have very bad lower belly (abdominal) pain.  You are throwing up and cannot keep down any medicines or water. This information is not intended to replace advice given to you by your health care provider. Make sure you discuss any questions you have with your health care provider. Document Released: 07/23/2007 Document Revised: 07/12/2015 Document Reviewed: 12/25/2014 Elsevier Interactive Patient Education  2017 Elsevier Inc.  Vaginal Yeast infection, Adult Vaginal yeast infection is a condition that causes soreness, swelling, and redness (inflammation) of the vagina. It also causes vaginal discharge. This is a common condition. Some  women get this infection frequently. What are the causes? This condition is caused by a change in the normal balance of the yeast (candida) and bacteria that live in the vagina. This change causes an overgrowth of yeast, which causes the inflammation. What increases the risk? This condition is more likely to develop in:  Women who take antibiotic medicines.  Women who have diabetes.  Women who take birth control pills.  Women who are pregnant.  Women who douche often.  Women who have a weak defense (immune) system.  Women who have been taking steroid medicines for a long time.  Women who frequently wear tight clothing. What are the signs or symptoms? Symptoms of this condition include:  White, thick vaginal discharge.  Swelling, itching, redness, and irritation of the vagina. The lips of the vagina (vulva) may be affected as well.  Pain or a burning feeling while urinating.  Pain during sex. How is this diagnosed? This condition is diagnosed with a medical history and physical exam. This will include a pelvic exam. Your health care provider will examine a sample of your vaginal discharge under a microscope. Your health care provider may send this sample for testing to confirm the diagnosis. How is this treated? This condition is treated with medicine. Medicines may be over-the-counter or prescription. You may be told to use one or more of the following:  Medicine that is taken orally.  Medicine that is applied as a cream.  Medicine that is inserted directly into the vagina (suppository). Follow   these instructions at home:  Take or apply over-the-counter and prescription medicines only as told by your health care provider.  Do not have sex until your health care provider has approved. Tell your sex partner that you have a yeast infection. That person should go to his or her health care provider if he or she develops symptoms.  Do not wear tight clothes, such as pantyhose  or tight pants.  Avoid using tampons until your health care provider approves.  Eat more yogurt. This may help to keep your yeast infection from returning.  Try taking a sitz bath to help with discomfort. This is a warm water bath that is taken while you are sitting down. The water should only come up to your hips and should cover your buttocks. Do this 3-4 times per day or as told by your health care provider.  Do not douche.  Wear breathable, cotton underwear.  If you have diabetes, keep your blood sugar levels under control. Contact a health care provider if:  You have a fever.  Your symptoms go away and then return.  Your symptoms do not get better with treatment.  Your symptoms get worse.  You have new symptoms.  You develop blisters in or around your vagina.  You have blood coming from your vagina and it is not your menstrual period.  You develop pain in your abdomen. This information is not intended to replace advice given to you by your health care provider. Make sure you discuss any questions you have with your health care provider. Document Released: 11/13/2004 Document Revised: 07/18/2015 Document Reviewed: 08/07/2014 Elsevier Interactive Patient Education  2017 Elsevier Inc.  

## 2016-07-03 NOTE — Progress Notes (Signed)
31 y.o. Single African American female G0P0000 here with complaint of UTI, with onset  on 3 days ago. Patient complaining of urinary frequency and slight discomfort with urination. Patient denies fever, chills, nausea or back pain. No new personal products. Patient feels could be related to sexual activity. Complaining of increase in vaginal discharge with slight itching No STD concerns or testing needed..  Contraception is Mirena IUD which was due for removal 4/18. Would like to have another Mirena  IUD. Patient is drinking adequate water intake. No other health issues today  ROS Pertinent to HPI  POCT urine: 2 + WBC only POCT UPT: negative  O: Healthy female WDWN Affect: Normal, orientation x 3 Skin : warm and dry CVAT: negative bilateral Abdomen: negative for suprapubic tenderness  Pelvic exam: External genital area: normal, no lesions Bladder,Urethra non  tender, Urethral meatus:non tender, no redness Vagina: thick white musty odor smell vaginal discharge,   Wet prep taken, ph 4.0 Cervix: normal, non tender, IUD string noted in cervical os Uterus:normal,non tender Adnexa: normal non tender, no fullness or masses   A: Normal pelvic exam Yeast vaginitis R/O UTI Mirena IUD needs replacement, was due 4/18  P: Reviewed findings of yeast vaginitis and etiology with urinary frequency. Do not feel it is UTI at this point. Will treat if indicated by lab. Rx Diflucan see order with instructions ZOX:[id number]:Urine micro only Reviewed warning signs and symptoms of UTI and need to advise if change in symptoms, Encouraged to limit soda, tea, and coffee and be sure to increase water intake. Discussed replacement of Mirena IUD and will need to use consistent condom use and return for HCG qualitative HCG in 2 weeks and if negative will proceed with IUD exchange. No unprotected sexual activity until after Mirena Insertion. Questions addressed. Orders placed. Patient will be called with insurance information  and scheduled once Lab is complete.   RV prn, as above

## 2016-07-04 ENCOUNTER — Telehealth: Payer: Self-pay

## 2016-07-04 LAB — URINALYSIS, MICROSCOPIC ONLY
Casts: NONE SEEN [LPF]
Crystals: NONE SEEN [HPF]
Yeast: NONE SEEN [HPF]

## 2016-07-04 NOTE — Telephone Encounter (Signed)
lmtcb

## 2016-07-04 NOTE — Telephone Encounter (Signed)
-----   Message from Verner Choleborah S Leonard, CNM sent at 07/04/2016  7:31 AM EDT ----- Notify patient that Urine micro showed WBC,no RBC and very few bacteria.  Patient status?

## 2016-07-04 NOTE — Telephone Encounter (Signed)
Patient notified of results. See lab 

## 2016-07-04 NOTE — Telephone Encounter (Signed)
Patient is returning a call to Joy. °

## 2016-07-09 ENCOUNTER — Other Ambulatory Visit: Payer: Self-pay | Admitting: Certified Nurse Midwife

## 2016-07-09 NOTE — Telephone Encounter (Signed)
Call to patient to review benefit and schedule IUD removal and reinsertion. Patient understood and agreeable to benefit and scheduling recommendation by Billie RuddySally Yeakley.   Patient is scheduled for a serum HCG lab on 07-17-16. Per provider note, if negative will need to have IUD removal and insertion shortly after. Due to removal was due 4-18 and need to make sure no pregnancy risk. Patient understood and agreeable to plan.   Patient scheduled for 07-18-16 at 3pm with Dr. Edward JollySilva for removal and insertion pending result on 07-17-16. She is a patient of Leota SauersDeborah Leonard and stated Mrs. Debbi told her to schedule with an MD for this procedure.   Patient had questions about medication to soften her cervix that Mrs. Debbi told her about. Agreeable to return call from triage to review instructions.

## 2016-07-09 NOTE — Telephone Encounter (Signed)
Left message to call Janel Beane at [phone].  

## 2016-07-11 MED ORDER — MISOPROSTOL 200 MCG PO TABS: ORAL_TABLET | ORAL | 0 refills | Status: DC

## 2016-07-11 NOTE — Telephone Encounter (Signed)
Patient returned call

## 2016-07-11 NOTE — Telephone Encounter (Signed)
Spoke with patient. RX for cytotec to verified pharmacy on file. Reviewed cytotec instructions. Advised patient will await serum hCG lab results on 5/31, if negative, may proceed with cytotec as discussed for IUD removal and insertion. Patient verbalizes understanding and is agreeable.  Routing to provider for final review. Patient is agreeable to disposition. Will close encounter.  Cc: [name], CNM

## 2016-07-17 ENCOUNTER — Other Ambulatory Visit (INDEPENDENT_AMBULATORY_CARE_PROVIDER_SITE_OTHER): Payer: 59

## 2016-07-17 ENCOUNTER — Telehealth: Payer: Self-pay | Admitting: Obstetrics and Gynecology

## 2016-07-17 DIAGNOSIS — N912 Amenorrhea, unspecified: Secondary | ICD-10-CM

## 2016-07-17 NOTE — Telephone Encounter (Signed)
hCG qualitative pending. Returned call to patient, advised labs from today still pending, will call with results. Results would determine if ok to proceed with IUD exchange. Patient verbalizes understanding and is agreeable.

## 2016-07-17 NOTE — Telephone Encounter (Signed)
Patient is having a procedure tomorrow and has some questions for the nurse.

## 2016-07-17 NOTE — Telephone Encounter (Signed)
Spoke with patient. Patient reports menses started on 07/13/16, is still bleeding, would like to know if ok to procede with IUD exchange? Reports bleeding as light, wearing panty liner. Advised patient to keep IUD appointment as scheduled for 07/18/16 at 3pm. Advised patient would update Dr. Edward JollySilva and return call with any additional recommendations, patient verbalizes understanding and is agreeable.   Dr. Edward JollySilva, do you agree with recommendations?

## 2016-07-17 NOTE — Telephone Encounter (Signed)
Ok to keep appointment for IUD exchange tomorrow.

## 2016-07-18 ENCOUNTER — Encounter: Payer: Self-pay | Admitting: Obstetrics and Gynecology

## 2016-07-18 ENCOUNTER — Ambulatory Visit (INDEPENDENT_AMBULATORY_CARE_PROVIDER_SITE_OTHER): Payer: 59 | Admitting: Obstetrics and Gynecology

## 2016-07-18 VITALS — BP 120/80 | HR 60 | Resp 12 | Ht 66.0 in | Wt 229.6 lb

## 2016-07-18 DIAGNOSIS — Z113 Encounter for screening for infections with a predominantly sexual mode of transmission: Secondary | ICD-10-CM

## 2016-07-18 DIAGNOSIS — Z30433 Encounter for removal and reinsertion of intrauterine contraceptive device: Secondary | ICD-10-CM | POA: Diagnosis not present

## 2016-07-18 LAB — HCG, SERUM, QUALITATIVE: Preg, Serum: NEGATIVE

## 2016-07-18 NOTE — Telephone Encounter (Signed)
Spoke with patient, advised as seen below per Dr. Silva. Patient verbalizes understanding and is agreeable.   Routing to provider for final review. Patient is agreeable to disposition. Will close encounter.  

## 2016-07-18 NOTE — Progress Notes (Signed)
GYNECOLOGY  VISIT   HPI: 31 y.o.   Single  African American  female   G0P0000 with Patient's last menstrual period was 07/12/2016.   here for IUD removal/reinsertion.      Took Cytotec and Motrin in preparation for IUD exchange.   Current Mirena IUD is expired as of last month.  Qualitative hCG yesterday is negative.   Has lupus.   New sexual partner since last visit.   GYNECOLOGIC HISTORY: Patient's last menstrual period was 07/12/2016. Contraception:IUD Menopausal hormone therapy:  none Last mammogram:  n/a Last pap smear:   11/06/15 ASCUS; (+) HR HPV; Colpo 12/07/15 Cervical biopsy showed LSIL including HPV related changes. ECC showed benign endocervical elements, no dysplasia or malignancy.         OB History    Gravida Para Term Preterm AB Living   0 0 0 0 0 0   SAB TAB Ectopic Multiple Live Births   0 0 0 0           Patient Active Problem List   Diagnosis Date Noted  . Dyslipidemia 12/12/2015  . Vitamin D deficiency 12/12/2015  . Annual physical exam 12/11/2015  . Migraine without aura and with status migrainosus, not intractable 12/11/2015  . Obesity (BMI 35.0-39.9 without comorbidity) 12/11/2015  . Encounter for long-term (current) use of high-risk medication 04/26/2014  . Abnormal Pap smear of cervix 06/16/2013    Class: History of  . Lupus (systemic lupus erythematosus) (HCC) 10/22/2011    Past Medical History:  Diagnosis Date  . Abnormal Pap smear of cervix    LGSIL 2014 & 2015, ASCUS HPV HR+ 2016, ASCUS HPV HR+ 2017  . 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 Irregular periods    has IUD  . Lupus   . Migraines   . STD (sexually transmitted disease)    HSV2    Past Surgical History:  Procedure Laterality Date  . ACHILLES TENDON SURGERY Left 05/24/2014   Procedure: LEFT ACHILLES TENDON REPAIR;  Surgeon: Tarry KosNaiping M Xu, MD;  Location: [facility];  Service: Orthopedics;  Laterality: Left;  . COLPOSCOPY     2014 & 2015 LGSIL  . INTRAUTERINE  DEVICE (IUD) INSERTION     removal due 2018  . WISDOM TOOTH EXTRACTION      Current Outpatient Prescriptions  Medication Sig Dispense Refill  . EPINEPHrine 0.3 mg/0.3 mL IJ SOAJ injection Inject 0.3 mg into the muscle as needed (allergic reaction).     . hydroxychloroquine (PLAQUENIL) 200 MG tablet Take 200 mg by mouth at bedtime.     Marland Kitchen. levonorgestrel (MIRENA) 20 MCG/24HR IUD 1 each by Intrauterine route once. Implanted Fall 2013    . misoprostol (CYTOTEC) 200 MCG tablet Place one tablet vaginally night before procedure and place one tablet vaginally the morning of procedure. 2 tablet 0  . Multiple Vitamins-Minerals (MULTIVITAMIN PO) Take by mouth daily.    . Probiotic Product (PROBIOTIC PO) Take by mouth.    . SUMAtriptan (IMITREX) 50 MG tablet Take 50 mg by mouth daily as needed for migraine. May repeat in 2 hours if headache persists or recurs.    . valACYclovir (VALTREX) 500 MG tablet TAKE 1 TABLET BY MOUTH  DAILY; INCREASE TO TWO  TIMES DAILY FOR THREE DAYS  ONLY AT ONSET OF OUTBREAK 90 tablet 3  . fluticasone (FLONASE) 50 MCG/ACT nasal spray Place into the nose.     No current facility-administered medications for this visit.      ALLERGIES: Other  Family History  Problem Relation Age of Onset  . Hypertension Mother   . Hypertension Father   . Cancer Father        pancreatic  . Depression Father     Social History   Social History  . Marital status: Single    Spouse name: N/A  . Number of children: 0  . Years of education: N/A   Occupational History  .  [employer]   Social History Main Topics  . Smoking status: Never Smoker  . Smokeless tobacco: Never Used  . Alcohol use 1.2 oz/week    2 Standard drinks or equivalent per week  . Drug use: No  . Sexual activity: Yes    Partners: Male    Birth control/ protection: IUD     Comment: mirena inserted 06/06/2011   Other Topics Concern  . Not on file   Social History Narrative  . No narrative on file     ROS:  Pertinent items are noted in HPI.  PHYSICAL EXAMINATION:    BP 120/80 (BP Location: Left Arm, Patient Position: Sitting, Cuff Size: Normal)   Pulse 60   Resp 12   Ht 5\' 6"  (1.676 m)   Wt 229 lb 9.6 oz (104.1 kg)   LMP 07/12/2016   BMI 37.06 kg/m     General appearance: alert, cooperative and appears stated age   Pelvic: External genitalia:  no lesions              Urethra:  normal appearing urethra with no masses, tenderness or lesions              Bartholins and Skenes: normal                 Vagina: normal appearing vagina with normal color and discharge, no lesions              Cervix: no lesions. IUD strings noted.                Bimanual Exam:  Uterus:  normal size, contour, position, consistency, mobility, non-tender              Adnexa: no mass, fullness, tenderness            IUD removal and reinsertion of Mirena IUD.  Mirena IUD lot number [id number], exp 08/20.  Consent for procedures.  Sterile prep with Hibiclens. Local 1% lidocaine 10 cc as paracervical block - lot [id number], exp 02/21. Old IUD removed with ring forceps, intact, shown to patient, and discarded. Tenaculum to ant cervical lip.  Uterus sounded to just beyond 7 cm.  Mirena IUD placed without difficulty.  Strings trimmed.  Repeat exam performed, and no change.  Minimal EBL. No complication.   Chaperone was present for exam.  ASSESSMENT  Mirena IUD removal and reinsertion of new Mirena IUD. Need for STD screening.    PLAN  Instructions and precautions given for IUD.  Back up protection for at least one week. Motrin prn.  GC/CT today. Do serum STD screening at next visit.  Follow up for IUD check after July 4th holiday.   An After Visit Summary was printed and given to the patient.  __15____ minutes face to face time of which over 50% was spent in counseling.

## 2016-07-18 NOTE — Patient Instructions (Signed)

## 2016-07-20 LAB — GC/CHLAMYDIA PROBE AMP
CHLAMYDIA, DNA PROBE: NEGATIVE
NEISSERIA GONORRHOEAE BY PCR: NEGATIVE

## 2016-07-24 ENCOUNTER — Telehealth: Payer: Self-pay

## 2016-07-24 NOTE — Telephone Encounter (Signed)
Spoke with patient. Advised of results as seen below from Dr.Silva. Patient verbalizes understanding and states she is doing well with her new Mirena IUD. No problems or concerns at this time.  Routing to provider for final review. Patient agreeable to disposition. Will close encounter.

## 2016-07-24 NOTE — Telephone Encounter (Signed)
-----   Message from Patton SallesBrook E Amundson C Silva, MD sent at 07/23/2016  7:41 PM EDT ----- Please report to the patient her negative gonorrhea and chlamydia testing.  I hope she is doing well with her new Mirena IUD.  I will see her back in about one month for her IUD check and her blood STD screening.

## 2016-08-22 ENCOUNTER — Ambulatory Visit (INDEPENDENT_AMBULATORY_CARE_PROVIDER_SITE_OTHER): Payer: 59 | Admitting: Obstetrics and Gynecology

## 2016-08-22 ENCOUNTER — Encounter: Payer: Self-pay | Admitting: Obstetrics and Gynecology

## 2016-08-22 VITALS — BP 122/68 | HR 80 | Resp 16 | Wt 226.0 lb

## 2016-08-22 DIAGNOSIS — Z30431 Encounter for routine checking of intrauterine contraceptive device: Secondary | ICD-10-CM

## 2016-08-22 DIAGNOSIS — Z113 Encounter for screening for infections with a predominantly sexual mode of transmission: Secondary | ICD-10-CM | POA: Diagnosis not present

## 2016-08-22 NOTE — Progress Notes (Signed)
GYNECOLOGY  VISIT   HPI: 31 y.o.   Single  [place]  female   G0P0000 with No LMP recorded. Patient is not currently having periods (Reason: IUD).   here for IUD check and lab work.  Mirena IUD exchange on 07/18/16.  Spotting initially but gone now. Sexually active and strings are not uncomfortable.  Had negative GC/CT the day of IUD insertion but needs to do serum testing.   GYNECOLOGIC HISTORY: No LMP recorded. Patient is not currently having periods (Reason: IUD). Contraception:  Mirena IUD inserted 07/18/16 Menopausal hormone therapy:  none Last mammogram:  n/a Last pap smear:   11/06/15 ASCUS; (+) HR HPV; Colpo 12/07/15 Cervical biopsy showed LSIL including HPV related changes. ECC showed benign endocervical elements, no dysplasia or malignancy.         OB History    Gravida Para Term Preterm AB Living   0 0 0 0 0 0   SAB TAB Ectopic Multiple Live Births   0 0 0 0           Patient Active Problem List   Diagnosis Date Noted  . Dyslipidemia 12/12/2015  . Vitamin D deficiency 12/12/2015  . Annual physical exam 12/11/2015  . Migraine without aura and with status migrainosus, not intractable 12/11/2015  . Obesity (BMI 35.0-39.9 without comorbidity) 12/11/2015  . Encounter for long-term (current) use of high-risk medication 04/26/2014  . Abnormal Pap smear of cervix 06/16/2013    Class: History of  . Lupus (systemic lupus erythematosus) (HCC) 10/22/2011    Past Medical History:  Diagnosis Date  . Abnormal Pap smear of cervix    LGSIL 2014 & 2015, ASCUS HPV HR+ 2016, ASCUS HPV HR+ 2017  . 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 Irregular periods    has IUD  . Lupus   . Migraines   . STD (sexually transmitted disease)    HSV2    Past Surgical History:  Procedure Laterality Date  . ACHILLES TENDON SURGERY Left 05/24/2014   Procedure: LEFT ACHILLES TENDON REPAIR;  Surgeon: Tarry KosNaiping M Xu, MD;  Location: [facility];  Service: Orthopedics;  Laterality:  Left;  . COLPOSCOPY     2014 & 2015 LGSIL  . INTRAUTERINE DEVICE (IUD) INSERTION     removal due 2018  . WISDOM TOOTH EXTRACTION      Current Outpatient Prescriptions  Medication Sig Dispense Refill  . EPINEPHrine 0.3 mg/0.3 mL IJ SOAJ injection Inject 0.3 mg into the muscle as needed (allergic reaction).     . hydroxychloroquine (PLAQUENIL) 200 MG tablet Take 200 mg by mouth at bedtime.     Marland Kitchen. levonorgestrel (MIRENA) 20 MCG/24HR IUD 1 each by Intrauterine route once. Implanted Fall 2013    . Multiple Vitamins-Minerals (MULTIVITAMIN PO) Take by mouth daily.    . Probiotic Product (PROBIOTIC PO) Take by mouth.    . SUMAtriptan (IMITREX) 50 MG tablet Take 50 mg by mouth daily as needed for migraine. May repeat in 2 hours if headache persists or recurs.    . valACYclovir (VALTREX) 500 MG tablet TAKE 1 TABLET BY MOUTH  DAILY; INCREASE TO TWO  TIMES DAILY FOR THREE DAYS  ONLY AT ONSET OF OUTBREAK 90 tablet 3   No current facility-administered medications for this visit.      ALLERGIES: Other  Family History  Problem Relation Age of Onset  . Hypertension Mother   . Hypertension Father   . Depression Father   . Prostate cancer Father  Social History   Social History  . Marital status: Single    Spouse name: N/A  . Number of children: 0  . Years of education: N/A   Occupational History  .  [employer]   Social History Main Topics  . Smoking status: Never Smoker  . Smokeless tobacco: Never Used  . Alcohol use 1.2 oz/week    2 Standard drinks or equivalent per week  . Drug use: No  . Sexual activity: Yes    Partners: Male    Birth control/ protection: IUD     Comment: mirena inserted 06/06/2011   Other Topics Concern  . Not on file   Social History Narrative  . No narrative on file    ROS:  Pertinent items are noted in HPI.  PHYSICAL EXAMINATION:    BP 122/68 (BP Location: Right Arm, Patient Position: Sitting, Cuff Size: Normal)   Pulse 80   Resp 16    Wt 226 lb (102.5 kg)   BMI 36.48 kg/m     General appearance: alert, cooperative and appears stated age   Pelvic: External genitalia:  no lesions              Urethra:  normal appearing urethra with no masses, tenderness or lesions              Bartholins and Skenes: normal                 Vagina: normal appearing vagina with normal color and discharge, no lesions              Cervix: no lesions.  IUD strings noted.                Bimanual Exam:  Uterus:  normal size, contour, position, consistency, mobility, non-tender              Adnexa: no mass, fullness, tenderness          Chaperone was present for exam.  ASSESSMENT  Mirena IUD check up.  Doing well. STD screening.  LGSIL.  PLAN  IUD working for pregnancy prevention.  HIV, hep B and C, and RPR testing today.  Follow up for annual exam and pap in Sept 2018 with Sara Chu.    An After Visit Summary was printed and given to the patient.  _15_____ minutes face to face time of which over 50% was spent in counseling.

## 2016-08-23 LAB — HEPATITIS C ANTIBODY: Hep C Virus Ab: <0.1 s/co ratio (ref 0.0–0.9)

## 2016-08-23 LAB — HEP, RPR, HIV PANEL
HEP B S AG: NEGATIVE
HIV SCREEN 4TH GENERATION: NONREACTIVE
RPR: NONREACTIVE

## 2016-08-25 ENCOUNTER — Telehealth: Payer: Self-pay

## 2016-08-25 NOTE — Telephone Encounter (Signed)
-----   Message from Patton SallesBrook E Amundson C Silva, MD sent at 08/25/2016  5:48 AM EDT ----- Please report negative serum STD testing to patient.  This includes HIV, syphilis, and hepatitis B and C.

## 2016-08-25 NOTE — Telephone Encounter (Signed)
Spoke with patient. Results given. Patient verbalizes understanding. 

## 2016-11-07 ENCOUNTER — Ambulatory Visit: Payer: 59 | Admitting: Certified Nurse Midwife

## 2016-11-18 ENCOUNTER — Telehealth: Payer: Self-pay | Admitting: *Deleted

## 2016-11-18 NOTE — Telephone Encounter (Signed)
aex scheduled for 12-26-17 at 8am

## 2016-11-18 NOTE — Telephone Encounter (Signed)
Patient in 08 recall for 10/2016. Please contact patient regarding scheduling AEX/PAP

## 2016-11-29 ENCOUNTER — Other Ambulatory Visit: Payer: Self-pay | Admitting: Certified Nurse Midwife

## 2016-12-01 NOTE — Telephone Encounter (Signed)
Medication refill request: Valtrex  Last AEX:  11-06-15  Next AEX: 12-26-16  Last MMG (if hormonal medication request): N/A Refill authorized: please advise

## 2016-12-26 ENCOUNTER — Ambulatory Visit: Payer: 59 | Admitting: Certified Nurse Midwife

## 2016-12-26 ENCOUNTER — Other Ambulatory Visit (HOSPITAL_COMMUNITY)
Admission: RE | Admit: 2016-12-26 | Discharge: 2016-12-26 | Disposition: A | Discharge status: Home / Self Care | Payer: 59 | Source: Ambulatory Visit | Attending: Certified Nurse Midwife | Admitting: Certified Nurse Midwife

## 2016-12-26 ENCOUNTER — Encounter: Payer: Self-pay | Admitting: Certified Nurse Midwife

## 2016-12-26 VITALS — BP 106/68 | HR 60 | Resp 16 | Ht 65.5 in | Wt 227.0 lb

## 2016-12-26 DIAGNOSIS — Z124 Encounter for screening for malignant neoplasm of cervix: Secondary | ICD-10-CM | POA: Insufficient documentation

## 2016-12-26 DIAGNOSIS — Z87898 Personal history of other specified conditions: Secondary | ICD-10-CM | POA: Diagnosis not present

## 2016-12-26 DIAGNOSIS — R87810 Cervical high risk human papillomavirus (HPV) DNA test positive: Secondary | ICD-10-CM | POA: Insufficient documentation

## 2016-12-26 DIAGNOSIS — Z23 Encounter for immunization: Secondary | ICD-10-CM | POA: Diagnosis not present

## 2016-12-26 DIAGNOSIS — Z01419 Encounter for gynecological examination (general) (routine) without abnormal findings: Secondary | ICD-10-CM

## 2016-12-26 DIAGNOSIS — Z8739 Personal history of other diseases of the musculoskeletal system and connective tissue: Secondary | ICD-10-CM

## 2016-12-26 DIAGNOSIS — M329 Systemic lupus erythematosus, unspecified: Secondary | ICD-10-CM

## 2016-12-26 DIAGNOSIS — Z30431 Encounter for routine checking of intrauterine contraceptive device: Secondary | ICD-10-CM | POA: Diagnosis not present

## 2016-12-26 DIAGNOSIS — IMO0002 Reserved for concepts with insufficient information to code with codable children: Secondary | ICD-10-CM

## 2016-12-26 DIAGNOSIS — Z8742 Personal history of other diseases of the female genital tract: Secondary | ICD-10-CM

## 2016-12-26 MED ORDER — TETANUS-DIPHTH-ACELL PERTUSSIS 5-2.5-18.5 LF-MCG/0.5 IM SUSP: 0.5000 mL | Freq: Once | INTRAMUSCULAR | Status: DC

## 2016-12-26 NOTE — Patient Instructions (Signed)

## 2016-12-26 NOTE — Progress Notes (Signed)
31 y.o. G0P0000 Single  African American Fe here for annual exam.  Periods sporadic to scant with Mirena IUD, no issues. Partner change had STD screening with IUD insertion. No vaginal symptoms. Sees Rheumatology for Lupus/cholesterol/vitamin D management/ labs including lipids 4 x yearly. No labs today. Has been working on weight loss and down 20 pounds. No other health issues today.  Patient's last menstrual period was 12/23/2016 (exact date).          Sexually active: Yes.    The current method of family planning is IUD. Inserted 07/18/16   Exercising: Yes.    weights & cardio Smoker:  no  Health Maintenance: Pap:  10-13-14 ASCUS HPV HR+, 11-06-15 ASCUS HPV HR + Needs pap ;yearly due to Lupus History of Abnormal Pap: yes MMG:  none Self Breast exams: yes Colonoscopy:  none BMD:   none TDaP:  2009 Shingles: no Pneumonia: no Hep C and HIV: both neg 2018 Labs: no   reports that  has never smoked. she has never used smokeless tobacco. She reports that she drinks about 1.8 oz of alcohol per week. She reports that she does not use drugs.  Past Medical History:  Diagnosis Date  . Abnormal Pap smear of cervix    LGSIL 2014 & 2015, ASCUS HPV HR+ 2016, ASCUS HPV HR+ 2017  . 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 Irregular periods    has IUD  . Lupus   . Migraines   . STD (sexually transmitted disease)    HSV2    Past Surgical History:  Procedure Laterality Date  . COLPOSCOPY     2014, 2015, 2016, 2017 LGSIL  . INTRAUTERINE DEVICE (IUD) INSERTION     insertion 07-18-16  . WISDOM TOOTH EXTRACTION      Current Outpatient Medications  Medication Sig Dispense Refill  . EPINEPHrine 0.3 mg/0.3 mL IJ SOAJ injection Inject 0.3 mg into the muscle as needed (allergic reaction).     . hydroxychloroquine (PLAQUENIL) 200 MG tablet Take 200 mg by mouth at bedtime.     Marland Kitchen. levonorgestrel (MIRENA) 20 MCG/24HR IUD 1 each by Intrauterine route once. Implanted Fall 2013    . Multiple Vitamins-Minerals  (MULTIVITAMIN PO) Take by mouth daily.    . SUMAtriptan (IMITREX) 50 MG tablet Take 50 mg by mouth daily as needed for migraine. May repeat in 2 hours if headache persists or recurs.    . valACYclovir (VALTREX) 500 MG tablet TAKE 1 TABLET BY MOUTH  DAILY INCREASE TO TWO TIMES DAILY FOR 3 DAYS ONLY AT  ONSET OF OUTBREAK 40 tablet 0   No current facility-administered medications for this visit.     Family History  Problem Relation Age of Onset  . Hypertension Mother   . Hypertension Father   . Depression Father   . Prostate cancer Father     ROS:  Pertinent items are noted in HPI.  Otherwise, a comprehensive ROS was negative.  Exam:   BP 106/68   Pulse 60   Resp 16   Ht 5' 5.5" (1.664 m)   Wt 227 lb (103 kg)   LMP 12/23/2016 (Exact Date)   BMI 37.20 kg/m  Height: 5' 5.5" (166.4 cm) Ht Readings from Last 3 Encounters:  12/26/16 5' 5.5" (1.664 m)  07/18/16 5\' 6"  (1.676 m)  07/03/16 5' 4.75" (1.645 m)    General appearance: alert, cooperative and appears stated age Head: Normocephalic, without obvious abnormality, atraumatic Neck: no adenopathy, supple, symmetrical, trachea midline and thyroid normal to  inspection and palpation Lungs: clear to auscultation bilaterally Breasts: normal appearance, no masses or tenderness, No nipple retraction or dimpling, No nipple discharge or bleeding, No axillary or supraclavicular adenopathy Heart: regular rate and rhythm Abdomen: soft, non-tender; no masses,  no organomegaly Extremities: extremities normal, atraumatic, no cyanosis or edema Skin: Skin color, texture, turgor normal. No rashes or lesions Lymph nodes: Cervical, supraclavicular, and axillary nodes normal. No abnormal inguinal nodes palpated Neurologic: Grossly normal   Pelvic: External genitalia:  no lesions              Urethra:  normal appearing urethra with no masses, tenderness or lesions              Bartholin's and Skene's: normal                 Vagina: normal  appearing vagina with normal color and discharge, no lesions              Cervix: no bleeding following Pap, no cervical motion tenderness, no lesions and IUS string noted in cervix              Pap taken: Yes.   Bimanual Exam:  Uterus:  normal size, contour, position, consistency, mobility, non-tender              Adnexa: normal adnexa and no mass, fullness, tenderness               Rectovaginal: Confirms               Anus:  normal sphincter tone, no lesions  Chaperone present: yes  A:  Well Woman with normal exam  Contraception Mirena IUD due inserted 6/18  Intentional weight loss for health  History of Lupus/cholesterol and vitamin d def with Rheumatology management with labs and medication  Follow up pap smear from abnormal ASCUS + HPVHR 2016, 2017  Immunization update  P:   Reviewed health and wellness pertinent to exam  Warning signs of Mirena IUD given and need to advise if concerns  Continue to work on weight loss and exercise  Continue follow up with MD as indicated  Requests TDAP  Pap smear: yes if negative repeat yearly due Lupus, if abnormal per recommendations for management   counseled on breast self exam, STD prevention, HIV risk factors and prevention, feminine hygiene, adequate intake of calcium and vitamin D, diet and exercise  return annually or prn  An After Visit Summary was printed and given to the patient.

## 2016-12-30 ENCOUNTER — Other Ambulatory Visit: Payer: Self-pay | Admitting: Certified Nurse Midwife

## 2016-12-30 DIAGNOSIS — Z8742 Personal history of other diseases of the female genital tract: Secondary | ICD-10-CM

## 2016-12-30 DIAGNOSIS — R87618 Other abnormal cytological findings on specimens from cervix uteri: Secondary | ICD-10-CM

## 2016-12-30 DIAGNOSIS — R8789 Other abnormal findings in specimens from female genital organs: Secondary | ICD-10-CM

## 2016-12-30 LAB — CYTOLOGY - PAP
Diagnosis: NEGATIVE
HPV: DETECTED — AB

## 2016-12-31 ENCOUNTER — Other Ambulatory Visit: Payer: Self-pay | Admitting: Obstetrics and Gynecology

## 2017-01-01 NOTE — Telephone Encounter (Signed)
Medication refill request: Valtrex  Last AEX:  12-26-16  Next AEX: 01-07-18  Last MMG (if hormonal medication request): N/A Refill authorized: please advise

## 2017-01-06 ENCOUNTER — Telehealth: Payer: Self-pay | Admitting: Certified Nurse Midwife

## 2017-01-06 NOTE — Telephone Encounter (Signed)
Left message to call Tammy Giles at [phone]. 

## 2017-01-06 NOTE — Telephone Encounter (Signed)
Patient would like to reschedule her upcoming colposcopy appointment.

## 2017-01-12 NOTE — Telephone Encounter (Signed)
Spoke with patient. Appointment rescheduled for 12/20/208 at 10 am with [name] CNM. Patient is agreeable to date and time.   Instructions given. Motrin 800 mg po x , one hour before appointment with food. Make sure to eat a meal before appointment and drink plenty of fluids. Patient verbalized understanding and will call to reschedule if will be on menses or has any concerns regarding pregnancy. Patient agreeable and verbalized understanding of all instructions.   Routing to provider for final review. Patient agreeable to disposition. Will close encounter.

## 2017-01-14 ENCOUNTER — Ambulatory Visit: Payer: Self-pay | Admitting: Certified Nurse Midwife

## 2017-01-20 ENCOUNTER — Other Ambulatory Visit: Payer: Self-pay | Admitting: *Deleted

## 2017-01-20 DIAGNOSIS — R8789 Other abnormal findings in specimens from female genital organs: Secondary | ICD-10-CM

## 2017-01-20 DIAGNOSIS — R87618 Other abnormal cytological findings on specimens from cervix uteri: Secondary | ICD-10-CM

## 2017-02-05 ENCOUNTER — Ambulatory Visit: Payer: 59 | Admitting: Certified Nurse Midwife

## 2017-02-05 ENCOUNTER — Encounter: Payer: Self-pay | Admitting: Certified Nurse Midwife

## 2017-02-05 ENCOUNTER — Other Ambulatory Visit: Payer: Self-pay

## 2017-02-05 VITALS — BP 110/64 | HR 64 | Resp 16 | Ht 65.5 in | Wt 232.0 lb

## 2017-02-05 DIAGNOSIS — R87618 Other abnormal cytological findings on specimens from cervix uteri: Secondary | ICD-10-CM

## 2017-02-05 DIAGNOSIS — R8789 Other abnormal findings in specimens from female genital organs: Secondary | ICD-10-CM

## 2017-02-05 NOTE — Patient Instructions (Signed)

## 2017-02-05 NOTE — Progress Notes (Addendum)
Patient ID: Tammy Giles, female   DOB: 09/13/1985, 31 y.o.   MRN: [id number]  Chief Complaint  Patient presents with  . Colposcopy    //jj    HPI Tammy Giles is a 31 y.o. female.   Patient here for colposcopy exam. Denies vaginal bleeding or pelvic pain. Ate boiled eggs and took Naproxen prior to arriving. Contraception Mirena IUD    Indications: Pap smear on December 26 2016 showed: normal pap smear with positive HPVHR. Previous colposcopy: LSIL 2017. Prior cervical treatment: no treatment expectant management.  Past Medical History:  Diagnosis Date  . Abnormal Pap smear of cervix    LGSIL 2014 & 2015, ASCUS HPV HR+ 2016, ASCUS HPV HR+ 2017  . 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 Irregular periods    has IUD  . Lupus   . Migraines   . STD (sexually transmitted disease)    HSV2    Past Surgical History:  Procedure Laterality Date  . ACHILLES TENDON SURGERY Left 05/24/2014   Procedure: LEFT ACHILLES TENDON REPAIR;  Surgeon: Tarry KosNaiping M Xu, MD;  Location: [facility];  Service: Orthopedics;  Laterality: Left;  . COLPOSCOPY     2014, 2015, 2016, 2017 LGSIL  . INTRAUTERINE DEVICE (IUD) INSERTION     insertion 07-18-16  . WISDOM TOOTH EXTRACTION      Family History  Problem Relation Age of Onset  . Hypertension Mother   . Hypertension Father   . Depression Father   . Prostate cancer Father     Social History Social History   Tobacco Use  . Smoking status: Never Smoker  . Smokeless tobacco: Never Used  Substance Use Topics  . Alcohol use: Yes    Alcohol/week: 1.8 oz    Types: 3 Standard drinks or equivalent per week  . Drug use: No    Allergies  Allergen Reactions  . Other Swelling    SOMETHING IN MIDDLE EASTERN FOOD CAUSES SWELLING    Current Outpatient Medications  Medication Sig Dispense Refill  . hydroxychloroquine (PLAQUENIL) 200 MG tablet Take 200 mg by mouth at bedtime.     Marland Kitchen. levonorgestrel (MIRENA) 20 MCG/24HR IUD 1 each by Intrauterine  route once. Implanted Fall 2013    . Multiple Vitamins-Minerals (MULTIVITAMIN PO) Take by mouth daily.    . SUMAtriptan (IMITREX) 50 MG tablet Take 50 mg by mouth daily as needed for migraine. May repeat in 2 hours if headache persists or recurs.    . valACYclovir (VALTREX) 500 MG tablet TAKE 1 TABLET BY MOUTH ONCE DAILY. INCREASE TO TWO  TIMES DAILY FOR 3 DAYS ONLY AT ONSET OF OUTBREAK 40 tablet 12  . EPINEPHrine 0.3 mg/0.3 mL IJ SOAJ injection Inject 0.3 mg into the muscle as needed (allergic reaction).      No current facility-administered medications for this visit.     Review of Systems Review of Systems  Constitutional: Negative.   Gastrointestinal: Negative for abdominal pain.  Genitourinary: Negative for genital sores, pelvic pain, vaginal bleeding, vaginal discharge and vaginal pain.  Skin: Negative for color change.  Psychiatric/Behavioral: The patient is not nervous/anxious.     Blood pressure 110/64, pulse 64, resp. rate 16, height 5' 5.5" (1.664 m), weight 232 lb (105.2 kg).  Physical Exam Physical Exam  Constitutional: She is oriented to person, place, and time. She appears well-developed and well-nourished.  Genitourinary: Vagina normal. There is no rash, tenderness, lesion or injury on the right labia. There is no rash, tenderness, lesion or injury on  the left labia. Cervix exhibits no motion tenderness, no discharge and no friability. No tenderness or bleeding in the vagina. No vaginal discharge found.    Lymphadenopathy:       Right: No inguinal adenopathy present.       Left: No inguinal adenopathy present.  Neurological: She is alert and oriented to person, place, and time.  Skin: Skin is warm and dry.  Psychiatric: She has a normal mood and affect. Her behavior is normal. Judgment and thought content normal.    Data Reviewed Reviewed pap smear results with patient and positive HPVHR results. Questions addressed.  Assessment    Procedure Details  The risks  and benefits of the procedure and Written informed consent obtained. Questions addressed.  Speculum placed in vagina and excellent visualization of cervix achieved, cervix swabbed x 3 with saline and  acetic acid solution, no acetowhite appearance noted, Lugol's applied with light staining noted at 3 o'clock only. Viewed with 3.75,7.5,15 # and green filter. Biopsy taken. Monsel's applied. ECC obtained. IUD string noted intact after sampling. No active bleeding noted upon removal of speculum. Patient tolerated procedure well. Printed and verbal  Instructions given.  Specimens: 2  Complications: none.     Plan    Specimens labelled and sent to Pathology. Patient will be contacted once pathology reviewed. Pathology reviewed and Cervical biopsy showed benign squamous mucosa. Ecc showed benign squamous mucosa with squamous metaplasia and focal stromal decidualization. Patient has Mirena IUD and had negative UPT. Comment included hormonal influence noted. Patient will be notified of results. Will need to repeat pap in one year pap recall placed. 08      [name] 02/05/2017, 10:26 AM

## 2017-02-05 NOTE — Progress Notes (Signed)
Hx of LGSIL 12-26-16 neg HPV HR + Pt took 2 aleve at 9:45am

## 2017-02-13 ENCOUNTER — Telehealth: Payer: Self-pay

## 2017-02-13 DIAGNOSIS — Z79899 Other long term (current) drug therapy: Secondary | ICD-10-CM | POA: Insufficient documentation

## 2017-02-13 DIAGNOSIS — I7300 Raynaud's syndrome without gangrene: Secondary | ICD-10-CM | POA: Insufficient documentation

## 2017-02-13 DIAGNOSIS — E66812 Obesity, class 2: Secondary | ICD-10-CM | POA: Insufficient documentation

## 2017-02-13 DIAGNOSIS — Z6835 Body mass index (BMI) 35.0-35.9, adult: Secondary | ICD-10-CM

## 2017-02-13 DIAGNOSIS — E669 Obesity, unspecified: Secondary | ICD-10-CM | POA: Insufficient documentation

## 2017-02-13 NOTE — Telephone Encounter (Signed)
Spoke with patient and notified of colposcopy biopsy results as stated below by [name], CNM. Patient voices understanding and states she started menses yesterday and feels sure changes are due to IUD. She will due home UPT and call us back with results.

## 2017-02-13 NOTE — Telephone Encounter (Signed)
-----   Message from Verner Choleborah S Leonard, CNM sent at 02/12/2017  5:57 PM EST ----- Notify patient that her cervical biopsy showed benign squamous mucosa ECC showed benign squamous mucosa with squamous metaplasia and focal stromal decidualization. This can be due to hormonal presence of IUD or possible pregnancy. Patient needs to do home UPT and report results or can come to do here. just to clarify. I feel this more than likely hormonal from Mirena IUD.

## 2017-02-13 NOTE — Telephone Encounter (Signed)
Patient called back and states UPT is negative.

## 2017-02-13 NOTE — Telephone Encounter (Signed)
Spoke with patient and she states home UPT was negative. Advised will let Tammy Giles, CNM know. If she gives any further recommendations, I will call her back. Routed to provider

## 2017-02-13 NOTE — Progress Notes (Signed)
Office Visit Note  Patient: Tammy Giles             Date of Birth: 1985-11-10           MRN: [id number]             PCP: Coralyn Pear, MD Referring: Coralyn Pear, MD Visit Date: 02/16/2017 Occupation: Market research    Subjective:  Pain in hands.   History of Present Illness: Tammy Giles is a 31 y.o. female with history of systemic lupus. Patient states that she was diagnosed with lupus at age 57 when she developed fatigue and joint pain. At the time she had labs in by her PCP and was referred to Dr. Cardell Peach. She's been under care of Dr. Cardell Peach since then. She was started on Plaquenil at the beginning and she continued on Plaquenil since then. She does not recall being on steroids. She has had mild flares with some joint stiffness which resolve by itself. She denies any history of oral ulcers, rash, photosensitivity, pericarditis or nephritis. She states this month she started experiencing increased fatigue and swelling in her hands and wrists. She has not missed her medications. She's been seeing her ophthalmologist on a regular basis and getting yearly eye exam.  Activities of Daily Living:  Patient reports morning stiffness for 2 minutes.   Patient Reports nocturnal pain.  Difficulty dressing/grooming: Denies Difficulty climbing stairs: Denies Difficulty getting out of chair: Denies Difficulty using hands for taps, buttons, cutlery, and/or writing: Denies   Review of Systems  Constitutional: Positive for fatigue. Negative for night sweats, weight gain, weight loss and weakness.  HENT: Negative for mouth sores, trouble swallowing, trouble swallowing, mouth dryness and nose dryness.        History of nasal ulcers  Eyes: Negative for pain, redness, visual disturbance and dryness.  Respiratory: Negative for cough, shortness of breath and difficulty breathing.   Cardiovascular: Negative for chest pain, hypertension, irregular heartbeat and swelling in legs/feet.  Gastrointestinal:  Negative for blood in stool, constipation and diarrhea.  Endocrine: Negative for increased urination.  Genitourinary: Negative for vaginal dryness.  Musculoskeletal: Positive for arthralgias, joint pain, joint swelling and morning stiffness. Negative for myalgias, muscle weakness, muscle tenderness and myalgias.  Skin: Negative for color change, rash, hair loss, skin tightness, ulcers and sensitivity to sunlight.  Allergic/Immunologic: Negative for susceptible to infections.  Neurological: Negative for dizziness, memory loss and night sweats.  Hematological: Negative for swollen glands.  Psychiatric/Behavioral: Positive for sleep disturbance. Negative for depressed mood. The patient is not nervous/anxious.     PMFS History:  Patient Active Problem List   Diagnosis Date Noted  . Class 2 obesity without serious comorbidity with body mass index (BMI) of 35.0 to 35.9 in adult 02/13/2017  . Raynaud's syndrome without gangrene 02/13/2017  . High risk medication use 02/13/2017  . Dyslipidemia 12/12/2015  . Vitamin D deficiency 12/12/2015  . Annual physical exam 12/11/2015  . Migraine without aura and with status migrainosus, not intractable 12/11/2015  . Obesity (BMI 35.0-39.9 without comorbidity) 12/11/2015  . Encounter for long-term (current) use of high-risk medication 04/26/2014  . Abnormal Pap smear of cervix 06/16/2013    Class: History of  . Lupus (systemic lupus erythematosus) (HCC) 10/22/2011    Past Medical History:  Diagnosis Date  . Abnormal Pap smear of cervix    LGSIL 2014 & 2015, ASCUS HPV HR+ 2016, ASCUS HPV HR+ 2017  . 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 Irregular periods  has IUD  . Lupus   . Migraines   . STD (sexually transmitted disease)    HSV2    Family History  Problem Relation Age of Onset  . Hypertension Mother   . Hypertension Father   . Depression Father   . Prostate cancer Father   . Thyroid disease Sister    Past Surgical History:  Procedure  Laterality Date  . ACHILLES TENDON SURGERY Left 05/24/2014   Procedure: LEFT ACHILLES TENDON REPAIR;  Surgeon: Tarry KosNaiping M Xu, MD;  Location: [facility];  Service: Orthopedics;  Laterality: Left;  . COLPOSCOPY     2014, 2015, 2016, 2017 LGSIL  . INTRAUTERINE DEVICE (IUD) INSERTION     insertion 07-18-16  . WISDOM TOOTH EXTRACTION     Social History   Social History Narrative  . Not on file     Objective: Vital Signs: BP 111/76 (BP Location: Left Arm, Patient Position: Sitting, Cuff Size: Normal)   Pulse 75   Resp 16   Ht 5\' 6"  (1.676 m)   Wt 235 lb (106.6 kg)   BMI 37.93 kg/m    Physical Exam  Constitutional: She is oriented to person, place, and time. She appears well-developed and well-nourished.  HENT:  Head: Normocephalic and atraumatic.  Eyes: Conjunctivae and EOM are normal.  Neck: Normal range of motion.  Cardiovascular: Normal rate, regular rhythm, normal heart sounds and intact distal pulses.  Pulmonary/Chest: Effort normal and breath sounds normal.  Abdominal: Soft. Bowel sounds are normal.  Lymphadenopathy:    She has no cervical adenopathy.  Neurological: She is alert and oriented to person, place, and time.  Skin: Skin is warm and dry. Capillary refill takes less than 2 seconds.  Psychiatric: She has a normal mood and affect. Her behavior is normal.  Nursing note and vitals reviewed.    Musculoskeletal Exam: C-spine and thoracic lumbar spine good range of motion. Shoulder joints elbow joints wrist joints with good range of motion. She had swelling and synovitis over her left third PIP joint. None of the other joints shows synovitis. Hip joints knee joints ankles MTPs PIPs DIPs are good range of motion with no synovitis. She is some tenderness across her MTPs.  CDAI Exam: CDAI Homunculus Exam:   Tenderness:  Left hand: 3rd PIP  Swelling:  Left hand: 3rd PIP  Joint Counts:  CDAI Tender Joint count: 1 CDAI Swollen Joint count: 1  Global  Assessments:  Patient Global Assessment: 6 Provider Global Assessment: 3  CDAI Calculated Score: 11    Investigation: No additional findings.   Labs 01/23/17: MCV & MCH elevated, LFTs normal, Creatinine & GFR normal, UA normal C3 & C4 normal, dsDNA normal   Imaging: Xr Foot 2 Views Left  Result Date: 02/16/2017 No MTP PIP/DIP narrowing was noted. A small calcaneal spur was noted. Impression: Normal x-ray of the foot.  Xr Foot 2 Views Right  Result Date: 02/16/2017 No MTP, PIP or DIP narrowing was noted. No erosive changes were noted. Impression: Normal x-ray of the foot.  Xr Hand 2 View Left  Result Date: 02/16/2017 Minimal PIP narrowing was noted. No MCP or DIP narrowing or intercarpal  narrowing was noted. No erosive changes were noted. Mild juxta articular osteopenia was noted.  Xr Hand 2 View Right  Result Date: 02/16/2017 Minimal PIP narrowing was noted. No MCP or DIP narrowing or intercarpal  narrowing was noted. No erosive changes were noted. Mild juxta articular osteopenia was noted.   Speciality Comments: No  specialty comments available.    Procedures:  No procedures performed Allergies: Other   Assessment / Plan:     Visit Diagnoses: Other systemic lupus erythematosus with other organ involvement (HCC) - Labs from 2015: ANA >1:1280, + Smith, + RNP, + SSA -patient has long-standing history of lupus which appears to be controlled with Plaquenil. She states she does get intermittent swelling which resolves by itself. She's been having pain and swelling in her joints for the last 2 months. Currently she has swelling in her left third PIP joint. She declined prednisone which was offered. I'll obtain following labs to look at her status of autoimmune disease. She is currently using contraception. I will continue Plaquenil for right now. Plan: Urinalysis, Routine w reflex microscopic, Sedimentation rate, ANA, Beta-2 glycoprotein antibodies, Cardiolipin antibodies, IgG,  IgM, IgA, C3 and C4, Lupus Anticoagulant Eval w/Reflex, Sjogrens syndrome-B extractable nuclear antibody, Sjogrens syndrome-A extractable nuclear antibody, Anti-Smith antibody, RNP Antibody, Anti-scleroderma antibody, Anti-DNA antibody, double-stranded  Raynaud's syndrome without gangrene: Not very active. Warm clothing and keeping warmer core temperature was discussed.  High risk medication use - PLQ 200 mg 1 tablet po BID daily - Plan: CBC with Differential/Platelet, COMPLETE METABOLIC PANEL WITH GFR, Thiopurine methyltransferase(tpmt)rbc, Glucose 6 phosphate dehydrogenase, Hepatitis B core antibody, IgM, Hepatitis B surface antigen, Hepatitis C antibody  Pain in both hands - Plan: XR Hand 2 View Right, XR Hand 2 View Left, Rheumatoid factor, Cyclic citrul peptide antibody, IgG  Pain in both feet - Plan: XR Foot 2 Views Right, XR Foot 2 Views Left  Other fatigue - Plan: CK, TSH, IgG, IgA, IgM, Serum protein electrophoresis with reflex  Palpitations - Plan: Ambulatory referral to Cardiology   Dyslipidemia  Vitamin D deficiency: I'll check her vitamin D level today.  Migraine without aura and with status migrainosus, not intractable  Class 2 obesity without serious comorbidity with body mass index (BMI) of 35.0 to 35.9 in adult, unspecified obesity type   Orders: Orders Placed This Encounter  Procedures  . XR Hand 2 View Right  . XR Hand 2 View Left  . XR Foot 2 Views Right  . XR Foot 2 Views Left  . CBC with Differential/Platelet  . COMPLETE METABOLIC PANEL WITH GFR  . Urinalysis, Routine w reflex microscopic  . Sedimentation rate  . CK  . TSH  . ANA  . Rheumatoid factor  . Cyclic citrul peptide antibody, IgG  . IgG, IgA, IgM  . Serum protein electrophoresis with reflex  . Thiopurine methyltransferase(tpmt)rbc  . Glucose 6 phosphate dehydrogenase  . Hepatitis B core antibody, IgM  . Hepatitis B surface antigen  . Hepatitis C antibody  . Beta-2 glycoprotein antibodies    . Cardiolipin antibodies, IgG, IgM, IgA  . C3 and C4  . Lupus Anticoagulant Eval w/Reflex  . Sjogrens syndrome-B extractable nuclear antibody  . Sjogrens syndrome-A extractable nuclear antibody  . Anti-Smith antibody  . RNP Antibody  . Anti-scleroderma antibody  . Anti-DNA antibody, double-stranded  . VITAMIN D 25 Hydroxy (Vit-D Deficiency, Fractures)  . Ambulatory referral to Cardiology   No orders of the defined types were placed in this encounter.   Face-to-face time spent with patient was 45 minutes.  Greater than 50% of time was spent in counseling and coordination of care.  Follow-Up Instructions: Return in about 3 months (around 05/17/2017) for Systemic lupus.    Note - This record has been created using [employer] software.  Chart creation errors have been sought, but may not always  have been located. Such creation errors do not reflect on  the standard of medical care.

## 2017-02-16 ENCOUNTER — Ambulatory Visit (INDEPENDENT_AMBULATORY_CARE_PROVIDER_SITE_OTHER): Payer: Self-pay

## 2017-02-16 ENCOUNTER — Encounter: Payer: Self-pay | Admitting: Rheumatology

## 2017-02-16 ENCOUNTER — Ambulatory Visit: Payer: 59 | Admitting: Rheumatology

## 2017-02-16 VITALS — BP 111/76 | HR 75 | Resp 16 | Ht 66.0 in | Wt 235.0 lb

## 2017-02-16 DIAGNOSIS — R5383 Other fatigue: Secondary | ICD-10-CM

## 2017-02-16 DIAGNOSIS — E669 Obesity, unspecified: Secondary | ICD-10-CM

## 2017-02-16 DIAGNOSIS — I7300 Raynaud's syndrome without gangrene: Secondary | ICD-10-CM

## 2017-02-16 DIAGNOSIS — E559 Vitamin D deficiency, unspecified: Secondary | ICD-10-CM | POA: Diagnosis not present

## 2017-02-16 DIAGNOSIS — Z79899 Other long term (current) drug therapy: Secondary | ICD-10-CM | POA: Diagnosis not present

## 2017-02-16 DIAGNOSIS — M79671 Pain in right foot: Secondary | ICD-10-CM | POA: Diagnosis not present

## 2017-02-16 DIAGNOSIS — M3219 Other organ or system involvement in systemic lupus erythematosus: Secondary | ICD-10-CM

## 2017-02-16 DIAGNOSIS — E785 Hyperlipidemia, unspecified: Secondary | ICD-10-CM

## 2017-02-16 DIAGNOSIS — M79672 Pain in left foot: Secondary | ICD-10-CM

## 2017-02-16 DIAGNOSIS — Z6835 Body mass index (BMI) 35.0-35.9, adult: Secondary | ICD-10-CM

## 2017-02-16 DIAGNOSIS — M79642 Pain in left hand: Secondary | ICD-10-CM | POA: Diagnosis not present

## 2017-02-16 DIAGNOSIS — R002 Palpitations: Secondary | ICD-10-CM | POA: Diagnosis not present

## 2017-02-16 DIAGNOSIS — M79641 Pain in right hand: Secondary | ICD-10-CM

## 2017-02-16 DIAGNOSIS — G43001 Migraine without aura, not intractable, with status migrainosus: Secondary | ICD-10-CM

## 2017-02-16 NOTE — Telephone Encounter (Signed)
If UPT negative due to Mirena IUD hormone

## 2017-02-18 NOTE — Progress Notes (Signed)
Will discuss at fu visit

## 2017-02-21 LAB — COMPLETE METABOLIC PANEL WITH GFR
AG RATIO: 1.2 (calc) (ref 1.0–2.5)
ALT: 14 U/L (ref 6–29)
AST: 17 U/L (ref 10–30)
Albumin: 4.3 g/dL (ref 3.6–5.1)
Alkaline phosphatase (APISO): 61 U/L (ref 33–115)
BILIRUBIN TOTAL: 0.5 mg/dL (ref 0.2–1.2)
BUN: 20 mg/dL (ref 7–25)
CHLORIDE: 105 mmol/L (ref 98–110)
CO2: 23 mmol/L (ref 20–32)
Calcium: 9.2 mg/dL (ref 8.6–10.2)
Creat: 0.88 mg/dL (ref 0.50–1.10)
GFR, EST AFRICAN AMERICAN: 103 mL/min/{1.73_m2} (ref >=60)
GFR, Est Non African American: 89 mL/min/{1.73_m2} (ref >=60)
Globulin: 3.5 g/dL (calc) (ref 1.9–3.7)
Glucose, Bld: 74 mg/dL (ref 65–99)
POTASSIUM: 4.2 mmol/L (ref 3.5–5.3)
Sodium: 137 mmol/L (ref 135–146)
TOTAL PROTEIN: 7.8 g/dL (ref 6.1–8.1)

## 2017-02-21 LAB — PROTEIN ELECTROPHORESIS, SERUM, WITH REFLEX
ALBUMIN ELP: 4.4 g/dL (ref 3.8–4.8)
ALPHA 2: 0.7 g/dL (ref 0.5–0.9)
Alpha 1: 0.3 g/dL (ref 0.2–0.3)
BETA 2: 0.4 g/dL (ref 0.2–0.5)
BETA GLOBULIN: 0.4 g/dL (ref 0.4–0.6)
Gamma Globulin: 2 g/dL — ABNORMAL HIGH (ref 0.8–1.7)
TOTAL PROTEIN: 8.2 g/dL — AB (ref 6.1–8.1)

## 2017-02-21 LAB — CBC WITH DIFFERENTIAL/PLATELET
BASOS PCT: 0.4 %
Basophils Absolute: 18 cells/uL (ref 0–200)
EOS ABS: 59 {cells}/uL (ref 15–500)
Eosinophils Relative: 1.3 %
HCT: 41.5 % (ref 35.0–45.0)
HEMOGLOBIN: 14.1 g/dL (ref 11.7–15.5)
Lymphs Abs: 1445 cells/uL (ref 850–3900)
MCH: 33.6 pg — AB (ref 27.0–33.0)
MCHC: 34 g/dL (ref 32.0–36.0)
MCV: 98.8 fL (ref 80.0–100.0)
MONOS PCT: 8.5 %
MPV: 12 fL (ref 7.5–12.5)
NEUTROS ABS: 2597 {cells}/uL (ref 1500–7800)
Neutrophils Relative %: 57.7 %
Platelets: 189 10*3/uL (ref 140–400)
RBC: 4.2 10*6/uL (ref 3.80–5.10)
RDW: 12 % (ref 11.0–15.0)
Total Lymphocyte: 32.1 %
WBC: 4.5 10*3/uL (ref 3.8–10.8)
WBCMIX: 383 {cells}/uL (ref 200–950)

## 2017-02-21 LAB — C3 AND C4
C3 COMPLEMENT: 116 mg/dL (ref 83–193)
C4 Complement: 19 mg/dL (ref 15–57)

## 2017-02-21 LAB — ANTI-SCLERODERMA ANTIBODY: Scleroderma (Scl-70) (ENA) Antibody, IgG: <1 AI

## 2017-02-21 LAB — URINALYSIS, ROUTINE W REFLEX MICROSCOPIC
BILIRUBIN URINE: NEGATIVE
Bacteria, UA: NONE SEEN /HPF
Glucose, UA: NEGATIVE
Hgb urine dipstick: NEGATIVE
Hyaline Cast: NONE SEEN /LPF
NITRITE: NEGATIVE
Specific Gravity, Urine: 1.033 (ref 1.001–1.03)
WBC, UA: NONE SEEN /HPF (ref 0–5)
pH: 6.5 (ref 5.0–8.0)

## 2017-02-21 LAB — SJOGRENS SYNDROME-A EXTRACTABLE NUCLEAR ANTIBODY: SSA (Ro) (ENA) Antibody, IgG: >8 AI — AB

## 2017-02-21 LAB — IGG, IGA, IGM
IgG (Immunoglobin G), Serum: 2087 mg/dL — ABNORMAL HIGH (ref 694–1618)
IgM, Serum: 103 mg/dL (ref 48–271)
Immunoglobulin A: 282 mg/dL (ref 81–463)

## 2017-02-21 LAB — CARDIOLIPIN ANTIBODIES, IGG, IGM, IGA: Anticardiolipin IgM: <12 [MPL'U]

## 2017-02-21 LAB — CYCLIC CITRUL PEPTIDE ANTIBODY, IGG: Cyclic Citrullin Peptide Ab: <16 UNITS

## 2017-02-21 LAB — BETA-2 GLYCOPROTEIN ANTIBODIES: Beta-2 Glyco 1 IgA: <9 SAU (ref <=20)

## 2017-02-21 LAB — THIOPURINE METHYLTRANSFERASE (TPMT), RBC: THIOPURINE METHYLTRANSFERASE, RBC: 13 nmol/h/mL

## 2017-02-21 LAB — SJOGRENS SYNDROME-B EXTRACTABLE NUCLEAR ANTIBODY: SSB (La) (ENA) Antibody, IgG: <1 AI

## 2017-02-21 LAB — RHEUMATOID FACTOR: Rhuematoid fact SerPl-aCnc: <14 IU/mL (ref <14)

## 2017-02-21 LAB — GLUCOSE 6 PHOSPHATE DEHYDROGENASE: G-6PDH: 10.6 U/g Hgb (ref 7.0–20.5)

## 2017-02-21 LAB — ANTI-NUCLEAR AB-TITER (ANA TITER): ANA Titer 1: 1:320 {titer} — ABNORMAL HIGH

## 2017-02-21 LAB — ANA: Anti Nuclear Antibody(ANA): POSITIVE — AB

## 2017-02-21 LAB — HEPATITIS B CORE ANTIBODY, IGM: HEP B C IGM: NONREACTIVE

## 2017-02-21 LAB — ANTI-DNA ANTIBODY, DOUBLE-STRANDED: DS DNA AB: 2 [IU]/mL

## 2017-02-21 LAB — RNP ANTIBODY: Ribonucleic Protein(ENA) Antibody, IgG: >8 AI — AB

## 2017-02-21 LAB — TSH: TSH: 1.74 mIU/L

## 2017-02-21 LAB — HEPATITIS C ANTIBODY
Hepatitis C Ab: NONREACTIVE
SIGNAL TO CUT-OFF: 0.07 (ref <1.00)

## 2017-02-21 LAB — SEDIMENTATION RATE: Sed Rate: 14 mm/h (ref 0–20)

## 2017-02-21 LAB — CK: CK TOTAL: 120 U/L (ref 29–143)

## 2017-02-21 LAB — ANTI-SMITH ANTIBODY: ENA SM AB SER-ACNC: POSITIVE AI — AB

## 2017-02-21 LAB — LUPUS ANTICOAGULANT EVAL W/ REFLEX

## 2017-02-21 LAB — HEPATITIS B SURFACE ANTIGEN: HEP B S AG: NONREACTIVE

## 2017-02-21 LAB — VITAMIN D 25 HYDROXY (VIT D DEFICIENCY, FRACTURES): Vit D, 25-Hydroxy: 30 ng/mL (ref 30–100)

## 2017-03-12 ENCOUNTER — Telehealth: Payer: Self-pay | Admitting: Rheumatology

## 2017-03-12 DIAGNOSIS — M3219 Other organ or system involvement in systemic lupus erythematosus: Secondary | ICD-10-CM

## 2017-03-12 NOTE — Telephone Encounter (Signed)
Referral placed for Optometrist.

## 2017-03-12 NOTE — Telephone Encounter (Signed)
Patient left a voicemail stating that Dr. Corliss Skainseveshwar recommended she be referred to an eye dr and patient would like to be referred to Dr. Everardo PacificNiyati Patel.  CB# [phone]

## 2017-03-13 NOTE — Progress Notes (Signed)
Office Visit Note  Patient: Tammy Giles             Date of Birth: Nov 23, 1985           MRN: [id number]             PCP: Nevada Crane, MD Referring: Nevada Crane, MD Visit Date: 03/19/2017 Occupation: '@GUAROCC' @    Subjective:  Joint pain.   History of Present Illness: Keleigh Kazee is a 32 y.o. female history of systemic lupus dermatosis.  She states she has been feeling better as compared to last visit.  She was having some discomfort in her hands which is improved.  She has been working out on a regular basis.  Raynauds is better currently.  She was having some discomfort in her left shoulder after working out.  She has been going to a chiropractor which has been helpful.  She also saw a cardiologist recently for palpitations and had an EKG . According to patient that EKG was normal.  She has displayed her Plaquenil dose to twice daily which has been helpful.  She has not had any flares since the last visit.  Activities of Daily Living:  Patient reports morning stiffness for 0  minute.   Patient Denies nocturnal pain.  Difficulty dressing/grooming: Denies Difficulty climbing stairs: Denies Difficulty getting out of chair: Denies Difficulty using hands for taps, buttons, cutlery, and/or writing: Denies   Review of Systems  Constitutional: Negative for fatigue, night sweats, weight gain, weight loss and weakness.  HENT: Negative for mouth sores, trouble swallowing, trouble swallowing, mouth dryness and nose dryness.   Eyes: Negative for pain, redness, visual disturbance and dryness.  Respiratory: Negative for cough, shortness of breath and difficulty breathing.   Cardiovascular: Negative for chest pain, palpitations, hypertension, irregular heartbeat and swelling in legs/feet.  Gastrointestinal: Negative for blood in stool, constipation and diarrhea.  Endocrine: Negative for increased urination.  Genitourinary: Negative for vaginal dryness.  Musculoskeletal: Positive for  arthralgias and joint pain. Negative for joint swelling, myalgias, muscle weakness, morning stiffness, muscle tenderness and myalgias.  Skin: Positive for color change. Negative for rash, hair loss, skin tightness, ulcers and sensitivity to sunlight.  Allergic/Immunologic: Negative for susceptible to infections.  Neurological: Negative for dizziness, memory loss and night sweats.  Hematological: Negative for swollen glands.  Psychiatric/Behavioral: Negative for depressed mood and sleep disturbance. The patient is not nervous/anxious.     PMFS History:  Patient Active Problem List   Diagnosis Date Noted  . Class 2 obesity without serious comorbidity with body mass index (BMI) of 35.0 to 35.9 in adult 02/13/2017  . Raynaud's syndrome without gangrene 02/13/2017  . High risk medication use 02/13/2017  . Dyslipidemia 12/12/2015  . Vitamin D deficiency 12/12/2015  . Annual physical exam 12/11/2015  . Migraine without aura and with status migrainosus, not intractable 12/11/2015  . Obesity (BMI 35.0-39.9 without comorbidity) 12/11/2015  . Encounter for long-term (current) use of high-risk medication 04/26/2014  . Abnormal Pap smear of cervix 06/16/2013    Class: History of  . Lupus (systemic lupus erythematosus) ([place]) 10/22/2011    Past Medical History:  Diagnosis Date  . Abnormal Pap smear of cervix    LGSIL 2014 & 2015, ASCUS HPV HR+ 2016, ASCUS HPV HR+ 2017  . 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 Irregular periods    has IUD  . Lupus   . Migraines   . STD (sexually transmitted disease)    HSV2    Family History  Problem Relation Age of Onset  . Hypertension Mother   . Hypertension Father   . Depression Father   . Prostate cancer Father   . Thyroid disease Sister    Past Surgical History:  Procedure Laterality Date  . ACHILLES TENDON SURGERY Left 05/24/2014   Procedure: LEFT ACHILLES TENDON REPAIR;  Surgeon: Leandrew Koyanagi, MD;  Location: [place] [facility];  Service:  Orthopedics;  Laterality: Left;  . COLPOSCOPY     2014, 2015, 2016, 2017 LGSIL  . INTRAUTERINE DEVICE (IUD) INSERTION     insertion 07-18-16  . WISDOM TOOTH EXTRACTION     Social History   Social History Narrative  . Not on file     Objective: Vital Signs: BP 115/82 (BP Location: Left Arm, Patient Position: Sitting, Cuff Size: Large)   Pulse 73   Resp 13   Ht '5\' 5"'  (1.651 m)   Wt 233 lb (105.7 kg)   BMI 38.77 kg/m    Physical Exam  Constitutional: She is oriented to person, place, and time. She appears well-developed and well-nourished.  HENT:  Head: Normocephalic and atraumatic.  Eyes: Conjunctivae and EOM are normal.  Neck: Normal range of motion.  Cardiovascular: Normal rate, regular rhythm, normal heart sounds and intact distal pulses.  Pulmonary/Chest: Effort normal and breath sounds normal.  Abdominal: Soft. Bowel sounds are normal.  Lymphadenopathy:    She has no cervical adenopathy.  Neurological: She is alert and oriented to person, place, and time.  Skin: Skin is warm and dry. Capillary refill takes less than 2 seconds.  Psychiatric: She has a normal mood and affect. Her behavior is normal.  Nursing note and vitals reviewed.    Musculoskeletal Exam: C-spine thoracic lumbar spine good range of motion.  Shoulder joints elbows joints with joint MCPs PIPs DIPs with good range of motion.  Hip joints knee joints ankles MTPs.  History of physical range of motion.  No synovitis was noted on examination today.  CDAI Exam: No CDAI exam completed.    Investigation: No additional findings. CBC Latest Ref Rng & Units 02/16/2017 05/25/2013  WBC 3.8 - 10.8 Thousand/uL 4.5 -  Hemoglobin 11.7 - 15.5 g/dL 14.1 13.7  Hematocrit 35.0 - 45.0 % 41.5 -  Platelets 140 - 400 Thousand/uL 189 -   CMP     Component Value Date/Time   NA 137 02/16/2017 1134   K 4.2 02/16/2017 1134   CL 105 02/16/2017 1134   CO2 23 02/16/2017 1134   GLUCOSE 74 02/16/2017 1134   BUN 20 02/16/2017  1134   CREATININE 0.88 02/16/2017 1134   CALCIUM 9.2 02/16/2017 1134   PROT 7.8 02/16/2017 1134   PROT 8.2 (H) 02/16/2017 1134   AST 17 02/16/2017 1134   ALT 14 02/16/2017 1134   BILITOT 0.5 02/16/2017 1134   GFRNONAA 89 02/16/2017 1134   GFRAA 103 02/16/2017 1134  UA trace protein, CK 120, TSH normal, vitamin D 30 Hepatitis B negative, hepatitis C negative G6PD normal, TPM T normal, immunoglobulins normal, SPEP, specific ESR 14, RF negative, anti-CCP negative C3-C4 normal, ANA 1:320 homogeneous, positive Ro, SSB negative,, Smith positive, RNP positive, SCL 70 negative, DS DNA negative beta 2 negative, anticardiolipin negative, lupus anticoagulant not performed Imaging: No results found.  Speciality Comments: No specialty comments available.    Procedures:  No procedures performed Allergies: Other   Assessment / Plan:     Visit Diagnoses: Other systemic lupus erythematosus with other organ involvement ([place]) - ANA >1:1280, + Smith, +  RNP, + SSA.  She has no synovitis on examination.  She has not had any flares since the last visit.  She states she has been taking Plaquenil regular basis now.  Her labs from last visit were discussed at length.  She had mild proteinuria on her urine.  I will check her urine again today.  I would also check lupus anticoagulant as it was not performed.  She is planning a visit to [place] for her birthday.  I have advised her to take sunscreen and also protective clothing.  She has positive SSA antibody which can be associated with arrhythmias.  Recent cardiology evaluation was negative.  She states she had a EKG which was normal.  Association of SSA antibody with the fetal cardiac abnormalities was also discussed.  Raynaud's syndrome without gangrene: Warm clothing was discussed.  High risk medication use - PLQ 200 mg 1 tablet po BID.  Her labs were stable.  She will continue current treatment for right now.  I will repeat her labs at next  visit.  Dyslipidemia: Dietary modifications were discussed.  Class 2 obesity: Weight loss diet and exercise was discussed.  Association of heart disease with autoimmune disease was also discussed at length.  Migraine   Vitamin D deficiency: I have advised her to take vitamin D 50,000 units once a week for 3 months.  And then she should take 2000 units daily.  I would like to keep her vitamin D about 50.   Orders: Orders Placed This Encounter  Procedures  . Urinalysis, Routine w reflex microscopic  . Lupus Anticoagulant Eval w/Reflex   Meds ordered this encounter  Medications  . Vitamin D, Ergocalciferol, (DRISDOL) 50000 units CAPS capsule    Sig: Take 1 capsule (50,000 Units total) by mouth every 7 (seven) days.    Dispense:  12 capsule    Refill:  0    Face-to-face time spent with patient was 30 minutes.  Greater than 50% of time was spent in counseling and coordination of care.  Follow-Up Instructions: Return in about 5 months (around 08/16/2017) for SLE.   Bo Merino, MD  Note - This record has been created using Editor, commissioning.  Chart creation errors have been sought, but may not always  have been located. Such creation errors do not reflect on  the standard of medical care.

## 2017-03-19 ENCOUNTER — Encounter: Payer: Self-pay | Admitting: Rheumatology

## 2017-03-19 ENCOUNTER — Ambulatory Visit: Payer: 59 | Admitting: Rheumatology

## 2017-03-19 VITALS — BP 115/82 | HR 73 | Resp 13 | Ht 65.0 in | Wt 233.0 lb

## 2017-03-19 DIAGNOSIS — Z79899 Other long term (current) drug therapy: Secondary | ICD-10-CM | POA: Diagnosis not present

## 2017-03-19 DIAGNOSIS — E669 Obesity, unspecified: Secondary | ICD-10-CM

## 2017-03-19 DIAGNOSIS — E559 Vitamin D deficiency, unspecified: Secondary | ICD-10-CM

## 2017-03-19 DIAGNOSIS — G43001 Migraine without aura, not intractable, with status migrainosus: Secondary | ICD-10-CM | POA: Diagnosis not present

## 2017-03-19 DIAGNOSIS — M3219 Other organ or system involvement in systemic lupus erythematosus: Secondary | ICD-10-CM | POA: Diagnosis not present

## 2017-03-19 DIAGNOSIS — E785 Hyperlipidemia, unspecified: Secondary | ICD-10-CM | POA: Diagnosis not present

## 2017-03-19 DIAGNOSIS — Z6835 Body mass index (BMI) 35.0-35.9, adult: Secondary | ICD-10-CM | POA: Diagnosis not present

## 2017-03-19 DIAGNOSIS — I7300 Raynaud's syndrome without gangrene: Secondary | ICD-10-CM | POA: Diagnosis not present

## 2017-03-19 MED ORDER — VITAMIN D (ERGOCALCIFEROL) 1.25 MG (50000 UNIT) PO CAPS: 50000.0000 [IU] | ORAL_CAPSULE | ORAL | 0 refills | Status: DC

## 2017-03-19 NOTE — Patient Instructions (Signed)
Take vitamin D 50,000 units once a week for 3 months.  After that to start taking vitamin D 2000 units daily. Use sunscreen 50 SPF on daily basis. Use protective clothing to prevent sun exposure.

## 2017-03-22 LAB — URINALYSIS, ROUTINE W REFLEX MICROSCOPIC
Bilirubin Urine: NEGATIVE
Glucose, UA: NEGATIVE
HGB URINE DIPSTICK: NEGATIVE
LEUKOCYTES UA: NEGATIVE
NITRITE: NEGATIVE
PROTEIN: NEGATIVE
Specific Gravity, Urine: 1.006 (ref 1.001–1.03)
pH: 6 (ref 5.0–8.0)

## 2017-03-22 LAB — LUPUS ANTICOAGULANT EVAL W/ REFLEX
DRVVT SCREEN: 36 s (ref <=45)
PTT LA SCREEN: 35 s (ref <=40)

## 2017-05-19 ENCOUNTER — Ambulatory Visit (HOSPITAL_COMMUNITY)
Admission: EM | Admit: 2017-05-19 | Discharge: 2017-05-19 | Disposition: A | Discharge status: Home / Self Care | Payer: 59 | Attending: Family Medicine | Admitting: Family Medicine

## 2017-05-19 ENCOUNTER — Encounter (HOSPITAL_COMMUNITY): Payer: Self-pay | Admitting: Emergency Medicine

## 2017-05-19 DIAGNOSIS — R42 Dizziness and giddiness: Secondary | ICD-10-CM

## 2017-05-19 LAB — POCT URINALYSIS DIP (DEVICE)
Bilirubin Urine: NEGATIVE
Glucose, UA: NEGATIVE mg/dL
HGB URINE DIPSTICK: NEGATIVE
Ketones, ur: NEGATIVE mg/dL
LEUKOCYTES UA: NEGATIVE
NITRITE: NEGATIVE
PH: 6 (ref 5.0–8.0)
Protein, ur: NEGATIVE mg/dL
Specific Gravity, Urine: 1.01 (ref 1.005–1.030)
UROBILINOGEN UA: 0.2 mg/dL (ref 0.0–1.0)

## 2017-05-19 LAB — POCT I-STAT, CHEM 8
BUN: 17 mg/dL (ref 6–20)
CREATININE: 0.9 mg/dL (ref 0.44–1.00)
Calcium, Ion: 1.22 mmol/L (ref 1.15–1.40)
Chloride: 105 mmol/L (ref 101–111)
GLUCOSE: 86 mg/dL (ref 65–99)
HEMATOCRIT: 42 % (ref 36.0–46.0)
HEMOGLOBIN: 14.3 g/dL (ref 12.0–15.0)
Potassium: 4.3 mmol/L (ref 3.5–5.1)
Sodium: 141 mmol/L (ref 135–145)
TCO2: 26 mmol/L (ref 22–32)

## 2017-05-19 LAB — POCT PREGNANCY, URINE: Preg Test, Ur: NEGATIVE

## 2017-05-19 MED ORDER — MECLIZINE HCL 25 MG PO TABS: 25.0000 mg | ORAL_TABLET | Freq: Three times a day (TID) | ORAL | 0 refills | Status: DC | PRN

## 2017-05-19 MED ORDER — FLUTICASONE PROPIONATE 50 MCG/ACT NA SUSP: 2.0000 | Freq: Every day | NASAL | 0 refills | Status: DC

## 2017-05-19 MED ORDER — CETIRIZINE HCL 10 MG PO TABS: 10.0000 mg | ORAL_TABLET | Freq: Every day | ORAL | 0 refills | Status: DC

## 2017-05-19 MED ORDER — IPRATROPIUM BROMIDE 0.06 % NA SOLN: 2.0000 | Freq: Four times a day (QID) | NASAL | 0 refills | Status: DC

## 2017-05-19 NOTE — ED Provider Notes (Signed)
[facility]    CSN: [id number] Arrival date & time: 05/19/17  1344     History   Chief Complaint Chief Complaint  Patient presents with  . Dizziness    HPI Tammy Giles is a 32 y.o. female.   32 year old female with history of lupus, migraines, raynaud's, HLD comes in for dizziness.  States first episode happened 1 week ago, she was sitting in front of her computer when the room started spinning.  She laid down and drank some water, which resolved the symptoms.  She had one episode of nonbilious nonbloody vomit during that time.  States she flew out of the country the next day, and was fine during her whole trip.  Had slight symptoms where she feels "like in a mental fog" 3-4 days ago, and thought it was due to caffeine withdrawal.  She had alcohol use the next day, and had similar symptoms, but states she expected that.  Today, she had another episode of dizziness describes as room spinning when she was sitting down at her desk.  This episode lasted 10-15 minutes.  States any eye movement and up-and-down positional changes made the symptoms worse.  Did also feel lightheaded, as if she is about to pass out. Denies syncope, weakness. Denies nausea and vomiting during this episode.  Denies chest pain, shortness of breath, palpitation, wheezing.  She did notice some nasal congestion, rhinorrhea, cough for the past week.  Denies fever, chills, night sweats.  Denies blood in the urine, blood in stool.  Denies urinary symptoms such as frequency, dysuria, hematuria.  She has IUD placement, LMP first week in March.  Denies history of thyroid disease.     Past Medical History:  Diagnosis Date  . Abnormal Pap smear of cervix    LGSIL 2014 & 2015, ASCUS HPV HR+ 2016, ASCUS HPV HR+ 2017  . 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 Irregular periods    has IUD  . Lupus (HCC)   . Migraines   . STD (sexually transmitted disease)    HSV2    Patient Active Problem List   Diagnosis Date  Noted  . Class 2 obesity without serious comorbidity with body mass index (BMI) of 35.0 to 35.9 in adult 02/13/2017  . Raynaud's syndrome without gangrene 02/13/2017  . High risk medication use 02/13/2017  . Dyslipidemia 12/12/2015  . Vitamin D deficiency 12/12/2015  . Annual physical exam 12/11/2015  . Migraine without aura and with status migrainosus, not intractable 12/11/2015  . Obesity (BMI 35.0-39.9 without comorbidity) 12/11/2015  . Encounter for long-term (current) use of high-risk medication 04/26/2014  . Abnormal Pap smear of cervix 06/16/2013    Class: History of  . Lupus (systemic lupus erythematosus) (HCC) 10/22/2011    Past Surgical History:  Procedure Laterality Date  . ACHILLES TENDON SURGERY Left 05/24/2014   Procedure: LEFT ACHILLES TENDON REPAIR;  Surgeon: Tarry Kos, MD;  Location: [facility];  Service: Orthopedics;  Laterality: Left;  . COLPOSCOPY     2014, 2015, 2016, 2017 LGSIL  . INTRAUTERINE DEVICE (IUD) INSERTION     insertion 07-18-16  . WISDOM TOOTH EXTRACTION      OB History    Gravida  0   Para  0   Term  0   Preterm  0   AB  0   Living  0     SAB  0   TAB  0   Ectopic  0   Multiple  0   Live Births               Home Medications    Prior to Admission medications   Medication Sig Start Date End Date Taking? Authorizing Provider  cetirizine (ZYRTEC) 10 MG tablet Take 1 tablet (10 mg total) by mouth daily. 05/19/17   Cathie Hoops, Amy V, PA-C  EPINEPHrine 0.3 mg/0.3 mL IJ SOAJ injection Inject 0.3 mg into the muscle as needed (allergic reaction).     [provider]  fluticasone (FLONASE) 50 MCG/ACT nasal spray Place 2 sprays into both nostrils daily. 05/19/17   Cathie Hoops, Amy V, PA-C  hydroxychloroquine (PLAQUENIL) 200 MG tablet Take 400 mg by mouth daily.  04/25/12   [provider]  ipratropium (ATROVENT) 0.06 % nasal spray Place 2 sprays into both nostrils 4 (four) times daily. 05/19/17   Belinda Fisher, PA-C    levonorgestrel (MIRENA) 20 MCG/24HR IUD 1 each by Intrauterine route once. Implanted Fall 2013    [provider]  meclizine (ANTIVERT) 25 MG tablet Take 1 tablet (25 mg total) by mouth 3 (three) times daily as needed for dizziness. 05/19/17   Cathie Hoops, Amy V, PA-C  Multiple Vitamins-Minerals (MULTIVITAMIN PO) Take by mouth daily.    [provider]  SUMAtriptan (IMITREX) 50 MG tablet Take 50 mg by mouth daily as needed for migraine. May repeat in 2 hours if headache persists or recurs.    [provider]  TURMERIC CURCUMIN PO Take by mouth daily.    [provider]  valACYclovir (VALTREX) 500 MG tablet TAKE 1 TABLET BY MOUTH ONCE DAILY. INCREASE TO TWO  TIMES DAILY FOR 3 DAYS ONLY AT ONSET OF OUTBREAK 01/01/17   Verner Chol, CNM  Vitamin D, Ergocalciferol, (DRISDOL) 50000 units CAPS capsule Take 1 capsule (50,000 Units total) by mouth every 7 (seven) days. 03/19/17   Pollyann Savoy, MD    Family History Family History  Problem Relation Age of Onset  . Hypertension Mother   . Hypertension Father   . Depression Father   . Prostate cancer Father   . Thyroid disease Sister     Social History Social History   Tobacco Use  . Smoking status: Never Smoker  . Smokeless tobacco: Never Used  Substance Use Topics  . Alcohol use: Yes    Alcohol/week: 1.8 oz    Types: 3 Standard drinks or equivalent per week  . Drug use: No     Allergies   Other   Review of Systems Review of Systems  Reason unable to perform ROS: See HPI as above.     Physical Exam Triage Vital Signs ED Triage Vitals [05/19/17 1403]  Enc Vitals Group     BP (!) 141/93     Pulse Rate 66     Resp 18     Temp 98.2 F (36.8 C)     Temp Source Oral     SpO2 97 %     Weight      Height      Head Circumference      Peak Flow      Pain Score      Pain Loc      Pain Edu?      Excl. in GC?    Orthostatic VS for the past 24 hrs:  BP- Lying Pulse- Lying BP- Sitting Pulse-  Sitting BP- Standing at 0 minutes Pulse- Standing at 0 minutes  05/19/17 1443 110/68 66 130/83 70 134/87 85  Updated Vital Signs BP 134/87 (BP Location: Right Arm)   Pulse 66   Temp 98.2 F (36.8 C) (Oral)   Resp 18   SpO2 97%   Physical Exam  Constitutional: She is oriented to person, place, and time. She appears well-developed and well-nourished. No distress.  HENT:  Head: Normocephalic and atraumatic.  Right Ear: Tympanic membrane, external ear and ear canal normal. Tympanic membrane is not erythematous and not bulging.  Left Ear: Tympanic membrane, external ear and ear canal normal. Tympanic membrane is not erythematous and not bulging.  Nose: Nose normal. Right sinus exhibits no maxillary sinus tenderness and no frontal sinus tenderness. Left sinus exhibits no maxillary sinus tenderness and no frontal sinus tenderness.  Mouth/Throat: Uvula is midline, oropharynx is clear and moist and mucous membranes are normal.  Eyes: Pupils are equal, round, and reactive to light. Conjunctivae, EOM and lids are normal. Right eye exhibits no nystagmus. Left eye exhibits no nystagmus.  Neck: Normal range of motion. Neck supple.  Cardiovascular: Normal rate, regular rhythm and normal heart sounds. Exam reveals no gallop and no friction rub.  No murmur heard. Pulmonary/Chest: Effort normal and breath sounds normal. She has no decreased breath sounds. She has no wheezes. She has no rhonchi. She has no rales.  Lymphadenopathy:    She has no cervical adenopathy.  Neurological: She is alert and oriented to person, place, and time. She has normal strength. She is not disoriented. No cranial nerve deficit or sensory deficit. She displays a negative Romberg sign. Coordination and gait normal. GCS eye subscore is 4. GCS verbal subscore is 5. GCS motor subscore is 6.  Normal rapid movement, finger to nose.   Skin: Skin is warm and dry.  Psychiatric: She has a normal mood and affect. Her behavior is normal.  Judgment normal.     UC Treatments / Results  Labs (all labs ordered are listed, but only abnormal results are displayed) Labs Reviewed  POCT URINALYSIS DIP (DEVICE)  POCT PREGNANCY, URINE  POCT I-STAT, CHEM 8    EKG None Radiology No results found.  Procedures Procedures (including critical care time)  Medications Ordered in UC Medications - No data to display   Initial Impression / Assessment and Plan / UC Course  I have reviewed the triage vital signs and the nursing notes.  Pertinent labs & imaging results that were available during my care of the patient were reviewed by me and considered in my medical decision making (see chart for details).    No alarming signs on exam. Urine negative for UTI. istat within normal limits. Neurological exam normal without focal deficits. Orthostatic borderline. Discussed possible dehydration, viral illness, vertigo causing symptoms. Symptomatic treatment discussed. Push fluids. Return precautions given. Otherwise follow up with PCP for further evaluation needed. Patient expresses understanding and agrees to plan.   Final Clinical Impressions(s) / UC Diagnoses   Final diagnoses:  Dizziness    ED Discharge Orders        Ordered    fluticasone (FLONASE) 50 MCG/ACT nasal spray  Daily     05/19/17 1450    ipratropium (ATROVENT) 0.06 % nasal spray  4 times daily     05/19/17 1450    cetirizine (ZYRTEC) 10 MG tablet  Daily     05/19/17 1450    meclizine (ANTIVERT) 25 MG tablet  3 times daily PRN     05/19/17 1450        Belinda FisherYu, Amy V, PA-C 05/19/17 1625

## 2017-05-19 NOTE — ED Triage Notes (Signed)
Pt sts some dizziness x 1 week at times; pt sts nasal congestion today

## 2017-05-19 NOTE — Discharge Instructions (Signed)
No alarming signs on your exam.  Your blood work and urine are all normal.  As discussed, contributing causes could be vertigo, dehydration, nasal congestion.  Start Flonase, Atrovent, Zyrtec for nasal congestion/drainage.  You can take meclizine to help with dizziness. Keep hydrated, your urine should be clear to pale yellow in color.  Monitor for any worsening symptoms, headache/blurry vision, confusion/altered mental status, weakness, imbalance, passing out, go to the emergency department for further evaluation.  Otherwise follow-up with PCP for further evaluation needed.

## 2017-07-28 NOTE — Progress Notes (Signed)
Office Visit Note  Patient: Tammy Giles             Date of Birth: September 01, 1985           MRN: [id number]             PCP: Coralyn Pear, MD Referring: Coralyn Pear, MD Visit Date: 08/11/2017 Occupation: @GUAROCC @    Subjective:  Left shoulder pain   History of Present Illness: Tammy Giles is a 32 y.o. female with history of systemic lupus erythematosus and Raynauds. Patient is on Plaquenil 200 mg 1 tablet twice daily.  She reports she has not missed any doses of Plaquenil.  She denies any recent lupus flares.  She denies any worsening fatigue or low grade fevers.  She has been working out on a regular basis.  She has been trying to eliminate gluten and sugars from her diet, which helps decrease inflammation. She reports she was out of town and off of her diet and exercise regimen, which she feels has led to left shoulder joint pain.  She reports she has good ROM and has been able to perform her daily exercise regimen.  She states the pain is most severe in the evening.  She has been icing her left shoulder.  She denies any other joint pain or joint swelling.  She denies any facial rashes or lesions on her skin.  She denies any sores in her mouth or nose.  She denies any swollen lymph nodes.  Symptoms of Raynolds has been very well controlled.  She has no digital ulcerations.  She continues to have photosensitivity but wears sunscreen on a daily basis.  She states that when she is in the sun frequent short amount of time she has increased fatigue.  She denies any hair loss.  She finished taking vitamin D 50,000 units once weekly couple months ago but did not start on a maintenance dose of vitamin D.    Activities of Daily Living:  Patient reports morning stiffness for 0 minutes.   Patient Reports nocturnal pain.  Difficulty dressing/grooming: Denies Difficulty climbing stairs: Denies Difficulty getting out of chair: Denies Difficulty using hands for taps, buttons, cutlery, and/or  writing: Denies   Review of Systems  Constitutional: Negative for fatigue and fever.  HENT: Negative for ear pain, mouth sores, mouth dryness and nose dryness.   Eyes: Negative for pain, visual disturbance and dryness.  Respiratory: Negative for cough, hemoptysis, shortness of breath and difficulty breathing.   Cardiovascular: Negative for chest pain, palpitations, hypertension and swelling in legs/feet.  Gastrointestinal: Positive for constipation and diarrhea. Negative for blood in stool.  Endocrine: Negative for increased urination.  Genitourinary: Negative for difficulty urinating and painful urination.  Musculoskeletal: Positive for arthralgias and joint pain. Negative for joint swelling, myalgias, muscle weakness, morning stiffness, muscle tenderness and myalgias.  Skin: Negative for color change, pallor, rash, hair loss, nodules/bumps, skin tightness, ulcers and sensitivity to sunlight.  Allergic/Immunologic: Negative for susceptible to infections.  Neurological: Negative for dizziness, numbness, headaches and weakness.  Hematological: Negative for swollen glands.  Psychiatric/Behavioral: Positive for sleep disturbance. Negative for depressed mood. The patient is not nervous/anxious.     PMFS History:  Patient Active Problem List   Diagnosis Date Noted  . Class 2 obesity without serious comorbidity with body mass index (BMI) of 35.0 to 35.9 in adult 02/13/2017  . Raynaud's syndrome without gangrene 02/13/2017  . High risk medication use 02/13/2017  . Dyslipidemia 12/12/2015  . Vitamin D  deficiency 12/12/2015  . Annual physical exam 12/11/2015  . Migraine without aura and with status migrainosus, not intractable 12/11/2015  . Obesity (BMI 35.0-39.9 without comorbidity) 12/11/2015  . Encounter for long-term (current) use of high-risk medication 04/26/2014  . Abnormal Pap smear of cervix 06/16/2013    Class: History of  . Lupus (systemic lupus erythematosus) (HCC) 10/22/2011      Past Medical History:  Diagnosis Date  . Abnormal Pap smear of cervix    LGSIL 2014 & 2015, ASCUS HPV HR+ 2016, ASCUS HPV HR+ 2017  . 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 Irregular periods    has IUD  . Lupus (HCC)   . Migraines   . STD (sexually transmitted disease)    HSV2    Family History  Problem Relation Age of Onset  . Hypertension Mother   . Hypertension Father   . Depression Father   . Prostate cancer Father   . Thyroid disease Sister    Past Surgical History:  Procedure Laterality Date  . ACHILLES TENDON SURGERY Left 05/24/2014   Procedure: LEFT ACHILLES TENDON REPAIR;  Surgeon: Tarry Kos, MD;  Location: [facility];  Service: Orthopedics;  Laterality: Left;  . COLPOSCOPY     2014, 2015, 2016, 2017 LGSIL  . INTRAUTERINE DEVICE (IUD) INSERTION     insertion 07-18-16  . WISDOM TOOTH EXTRACTION     Social History   Social History Narrative  . Not on file     Objective: Vital Signs: BP 118/84 (BP Location: Left Arm, Patient Position: Sitting, Cuff Size: Normal)   Pulse 73   Ht 5\' 5"  (1.651 m)   Wt 242 lb (109.8 kg)   BMI 40.27 kg/m    Physical Exam  Constitutional: She is oriented to person, place, and time. She appears well-developed and well-nourished.  HENT:  Head: Normocephalic and atraumatic.  Eyes: Conjunctivae and EOM are normal.  Neck: Normal range of motion.  Cardiovascular: Normal rate, regular rhythm, normal heart sounds and intact distal pulses.  Pulmonary/Chest: Effort normal and breath sounds normal.  Abdominal: Soft. Bowel sounds are normal.  Lymphadenopathy:    She has no cervical adenopathy.  Neurological: She is alert and oriented to person, place, and time.  Skin: Skin is warm and dry. Capillary refill takes less than 2 seconds.  Psychiatric: She has a normal mood and affect. Her behavior is normal.  Nursing note and vitals reviewed.    Musculoskeletal Exam: C-spine, thoracic spine, lumbar spine good range of motion.   No midline spinal tenderness.  She has mild SI joint tenderness bilaterally.  She has full range of motion of bilateral shoulder joints with some discomfort in her left shoulder.  Elbow joints, wrist joints, MCPs, PIPs, DIPs good range of motion with no synovitis.  She has complete fist formation bilaterally.  Hip joints, knee joints, ankle joints, MTPs, PIPs, and DIPs good ROM with no synovitis.  No warmth or effusion of knee joints.  No tenderness of trochanteric bursa bilaterally.   CDAI Exam: No CDAI exam completed.    Investigation: No additional findings. CBC Latest Ref Rng & Units 05/19/2017 02/16/2017 05/25/2013  WBC 3.8 - 10.8 Thousand/uL - 4.5 -  Hemoglobin 12.0 - 15.0 g/dL 16.1 09.6 04.5  Hematocrit 36.0 - 46.0 % 42.0 41.5 -  Platelets 140 - 400 Thousand/uL - 189 -   CMP Latest Ref Rng & Units 05/19/2017 02/16/2017 02/16/2017  Glucose 65 - 99 mg/dL 86 - 74  BUN 6 -  20 mg/dL 17 - 20  Creatinine 9.140.44 - 1.00 mg/dL 7.820.90 - 9.560.88  Sodium 213135 - 145 mmol/L 141 - 137  Potassium 3.5 - 5.1 mmol/L 4.3 - 4.2  Chloride 101 - 111 mmol/L 105 - 105  CO2 20 - 32 mmol/L - - 23  Calcium 8.6 - 10.2 mg/dL - - 9.2  Total Protein 6.1 - 8.1 g/dL - 8.2(H) 7.8  Total Bilirubin 0.2 - 1.2 mg/dL - - 0.5  AST 10 - 30 U/L - - 17  ALT 6 - 29 U/L - - 14     Imaging: No results found.  Speciality Comments: PL[facility]    Procedures:  No procedures performed Allergies: Other     Assessment / Plan:     Visit Diagnoses: Other systemic lupus erythematosus with other organ involvement (HCC) - ANA >1:1280, + Smith, + RNP, + SSA: She has not had any recent lupus flares.  She has no synovitis on exam today.  She is been having some discomfort in her left shoulder joint has full range of motion on exam.  She was given a handout of shoulder exercises that she can perform at home.  We also discussed trying Mobisyl or Aspercreme over-the-counter.  She continues to  have discomfort we will x-ray her shoulder and try cortisone injection.  She has not had any worsening fatigue or low-grade fevers.  She continues to have photosensitivity will wear sunscreen on a daily basis.  We discussed the importance of wearing sunscreen to prevent flares.  She has not had any recent rashes and no malar rash was noted.  She is noticed oral or nasal ulcerations.  No sicca symptoms.  Her symptoms of Raynaud's have been very well controlled.  She has no signs of digital ulcerations or signs of gangrene.  She has no palpitations or shortness of breath.  She has seen a cardiologist in the past for evaluation.  She does not sleep a pulmonologist but has no pulmonary symptoms at this time.  We will check autoimmune labs today.  She will continue on Plaquenil 200 mg 1 tablet twice daily.  She is not needing refills at this time.  She was advised to notify us if she develops signs of a flare.- Plan: CBC with Differential/Platelet, COMPLETE METABOLIC PANEL WITH GFR, ANA, Urinalysis, Routine w reflex microscopic, Anti-DNA antibody, double-stranded, Sedimentation rate, C3 and C4, VITAMIN D 25 Hydroxy (Vit-D Deficiency, Fractures)  Raynaud's syndrome without gangrene: Her symptoms of Raynaud's have been very well controlled. She has no signs of gangrene or digital ulcerations.    High risk medication use - PLQ 200 mg 1 tablet po BID. PLQ Eye Exam: [facility].  CBC and CMP will be drawn today to monitor for drug toxicity.  She will return in 5 months with a repeat labs at that time.- Plan: CBC with Differential/Platelet, COMPLETE METABOLIC PANEL WITH GFR  Vitamin D deficiency -We will check vitamin D level today.  She has not been on a maintenance dose of vitamin D and finished vitamin D 50,000 units once weekly several months ago.  Plan: VITAMIN D 25 Hydroxy (Vit-D Deficiency, Fractures)  Other medical conditions are listed as follows:  Dyslipidemia  Migraine    Other fatigue: Her fatigue has been very well controlled.  She has been exercising on a regular basis.  Palpitations: She has not had any palpitations  recently.  She was evaluated by a cardiologist in the past.   Orders: Orders Placed This Encounter  Procedures  . CBC with Differential/Platelet  . COMPLETE METABOLIC PANEL WITH GFR  . ANA  . Urinalysis, Routine w reflex microscopic  . Anti-DNA antibody, double-stranded  . Sedimentation rate  . C3 and C4  . VITAMIN D 25 Hydroxy (Vit-D Deficiency, Fractures)   No orders of the defined types were placed in this encounter.   Face-to-face time spent with patient was 30 minutes.> 50% of time was spent in counseling and coordination of care.  Follow-Up Instructions: Return in about 5 months (around 01/11/2018) for Systemic lupus erythematosus, Raynaud's syndrome.   Gearldine Bienenstock, PA-C   I examined and evaluated the patient with Sherron Ales PA.  Patient is clinically doing well with no synovitis on examination.  She has been tolerating Plaquenil well.  The plan of care was discussed as noted above.  Pollyann Savoy, MD  Note - This record has been created using Animal nutritionist.  Chart creation errors have been sought, but may not always  have been located. Such creation errors do not reflect on  the standard of medical care.

## 2017-08-11 ENCOUNTER — Ambulatory Visit: Payer: 59 | Admitting: Rheumatology

## 2017-08-11 ENCOUNTER — Encounter: Payer: Self-pay | Admitting: Physician Assistant

## 2017-08-11 VITALS — BP 118/84 | HR 73 | Ht 65.0 in | Wt 242.0 lb

## 2017-08-11 DIAGNOSIS — I7300 Raynaud's syndrome without gangrene: Secondary | ICD-10-CM | POA: Diagnosis not present

## 2017-08-11 DIAGNOSIS — R002 Palpitations: Secondary | ICD-10-CM

## 2017-08-11 DIAGNOSIS — M3219 Other organ or system involvement in systemic lupus erythematosus: Secondary | ICD-10-CM

## 2017-08-11 DIAGNOSIS — E785 Hyperlipidemia, unspecified: Secondary | ICD-10-CM

## 2017-08-11 DIAGNOSIS — Z79899 Other long term (current) drug therapy: Secondary | ICD-10-CM | POA: Diagnosis not present

## 2017-08-11 DIAGNOSIS — G43001 Migraine without aura, not intractable, with status migrainosus: Secondary | ICD-10-CM | POA: Diagnosis not present

## 2017-08-11 DIAGNOSIS — R5383 Other fatigue: Secondary | ICD-10-CM | POA: Diagnosis not present

## 2017-08-11 DIAGNOSIS — E559 Vitamin D deficiency, unspecified: Secondary | ICD-10-CM | POA: Diagnosis not present

## 2017-08-11 NOTE — Patient Instructions (Signed)
Shoulder Exercises Ask your health care provider which exercises are safe for you. Do exercises exactly as told by your health care provider and adjust them as directed. It is normal to feel mild stretching, pulling, tightness, or discomfort as you do these exercises, but you should stop right away if you feel sudden pain or your pain gets worse.Do not begin these exercises until told by your health care provider. RANGE OF MOTION EXERCISES These exercises warm up your muscles and joints and improve the movement and flexibility of your shoulder. These exercises also help to relieve pain, numbness, and tingling. These exercises involve stretching your injured shoulder directly. Exercise A: Pendulum  1. Stand near a wall or a surface that you can hold onto for balance. 2. Bend at the waist and let your left / right arm hang straight down. Use your other arm to support you. Keep your back straight and do not lock your knees. 3. Relax your left / right arm and shoulder muscles, and move your hips and your trunk so your left / right arm swings freely. Your arm should swing because of the motion of your body, not because you are using your arm or shoulder muscles. 4. Keep moving your body so your arm swings in the following directions, as told by your health care provider: ? Side to side. ? Forward and backward. ? In clockwise and counterclockwise circles. 5. Continue each motion for __________ seconds, or for as long as told by your health care provider. 6. Slowly return to the starting position. Repeat __________ times. Complete this exercise __________ times a day. Exercise B:Flexion, Standing  1. Stand and hold a broomstick, a cane, or a similar object. Place your hands a little more than shoulder-width apart on the object. Your left / right hand should be palm-up, and your other hand should be palm-down. 2. Keep your elbow straight and keep your shoulder muscles relaxed. Push the stick down with  your healthy arm to raise your left / right arm in front of your body, and then over your head until you feel a stretch in your shoulder. ? Avoid shrugging your shoulder while you raise your arm. Keep your shoulder blade tucked down toward the middle of your back. 3. Hold for __________ seconds. 4. Slowly return to the starting position. Repeat __________ times. Complete this exercise __________ times a day. Exercise C: Abduction, Standing 1. Stand and hold a broomstick, a cane, or a similar object. Place your hands a little more than shoulder-width apart on the object. Your left / right hand should be palm-up, and your other hand should be palm-down. 2. While keeping your elbow straight and your shoulder muscles relaxed, push the stick across your body toward your left / right side. Raise your left / right arm to the side of your body and then over your head until you feel a stretch in your shoulder. ? Do not raise your arm above shoulder height, unless your health care provider tells you to do that. ? Avoid shrugging your shoulder while you raise your arm. Keep your shoulder blade tucked down toward the middle of your back. 3. Hold for __________ seconds. 4. Slowly return to the starting position. Repeat __________ times. Complete this exercise __________ times a day. Exercise D:Internal Rotation  1. Place your left / right hand behind your back, palm-up. 2. Use your other hand to dangle an exercise band, a towel, or a similar object over your shoulder. Grasp the band with   your left / right hand so you are holding onto both ends. 3. Gently pull up on the band until you feel a stretch in the front of your left / right shoulder. ? Avoid shrugging your shoulder while you raise your arm. Keep your shoulder blade tucked down toward the middle of your back. 4. Hold for __________ seconds. 5. Release the stretch by letting go of the band and lowering your hands. Repeat __________ times. Complete  this exercise __________ times a day. STRETCHING EXERCISES These exercises warm up your muscles and joints and improve the movement and flexibility of your shoulder. These exercises also help to relieve pain, numbness, and tingling. These exercises are done using your healthy shoulder to help stretch the muscles of your injured shoulder. Exercise E: Corner Stretch (External Rotation and Abduction)  1. Stand in a doorway with one of your feet slightly in front of the other. This is called a staggered stance. If you cannot reach your forearms to the door frame, stand facing a corner of a room. 2. Choose one of the following positions as told by your health care provider: ? Place your hands and forearms on the door frame above your head. ? Place your hands and forearms on the door frame at the height of your head. ? Place your hands on the door frame at the height of your elbows. 3. Slowly move your weight onto your front foot until you feel a stretch across your chest and in the front of your shoulders. Keep your head and chest upright and keep your abdominal muscles tight. 4. Hold for __________ seconds. 5. To release the stretch, shift your weight to your back foot. Repeat __________ times. Complete this stretch __________ times a day. Exercise F:Extension, Standing 1. Stand and hold a broomstick, a cane, or a similar object behind your back. ? Your hands should be a little wider than shoulder-width apart. ? Your palms should face away from your back. 2. Keeping your elbows straight and keeping your shoulder muscles relaxed, move the stick away from your body until you feel a stretch in your shoulder. ? Avoid shrugging your shoulders while you move the stick. Keep your shoulder blade tucked down toward the middle of your back. 3. Hold for __________ seconds. 4. Slowly return to the starting position. Repeat __________ times. Complete this exercise __________ times a day. STRENGTHENING  EXERCISES These exercises build strength and endurance in your shoulder. Endurance is the ability to use your muscles for a long time, even after they get tired. Exercise G:External Rotation  1. Sit in a stable chair without armrests. 2. Secure an exercise band at elbow height on your left / right side. 3. Place a soft object, such as a folded towel or a small pillow, between your left / right upper arm and your body to move your elbow a few inches away (about 10 cm) from your side. 4. Hold the end of the band so it is tight and there is no slack. 5. Keeping your elbow pressed against the soft object, move your left / right forearm out, away from your abdomen. Keep your body steady so only your forearm moves. 6. Hold for __________ seconds. 7. Slowly return to the starting position. Repeat __________ times. Complete this exercise __________ times a day. Exercise H:Shoulder Abduction  1. Sit in a stable chair without armrests, or stand. 2. Hold a __________ weight in your left / right hand, or hold an exercise band with both hands.   3. Start with your arms straight down and your left / right palm facing in, toward your body. 4. Slowly lift your left / right hand out to your side. Do not lift your hand above shoulder height unless your health care provider tells you that this is safe. ? Keep your arms straight. ? Avoid shrugging your shoulder while you do this movement. Keep your shoulder blade tucked down toward the middle of your back. 5. Hold for __________ seconds. 6. Slowly lower your arm, and return to the starting position. Repeat __________ times. Complete this exercise __________ times a day. Exercise I:Shoulder Extension 1. Sit in a stable chair without armrests, or stand. 2. Secure an exercise band to a stable object in front of you where it is at shoulder height. 3. Hold one end of the exercise band in each hand. Your palms should face each other. 4. Straighten your elbows and  lift your hands up to shoulder height. 5. Step back, away from the secured end of the exercise band, until the band is tight and there is no slack. 6. Squeeze your shoulder blades together as you pull your hands down to the sides of your thighs. Stop when your hands are straight down by your sides. Do not let your hands go behind your body. 7. Hold for __________ seconds. 8. Slowly return to the starting position. Repeat __________ times. Complete this exercise __________ times a day. Exercise J:Standing Shoulder Row 1. Sit in a stable chair without armrests, or stand. 2. Secure an exercise band to a stable object in front of you so it is at waist height. 3. Hold one end of the exercise band in each hand. Your palms should be in a thumbs-up position. 4. Bend each of your elbows to an "L" shape (about 90 degrees) and keep your upper arms at your sides. 5. Step back until the band is tight and there is no slack. 6. Slowly pull your elbows back behind you. 7. Hold for __________ seconds. 8. Slowly return to the starting position. Repeat __________ times. Complete this exercise __________ times a day. Exercise K:Shoulder Press-Ups  1. Sit in a stable chair that has armrests. Sit upright, with your feet flat on the floor. 2. Put your hands on the armrests so your elbows are bent and your fingers are pointing forward. Your hands should be about even with the sides of your body. 3. Push down on the armrests and use your arms to lift yourself off of the chair. Straighten your elbows and lift yourself up as much as you comfortably can. ? Move your shoulder blades down, and avoid letting your shoulders move up toward your ears. ? Keep your feet on the ground. As you get stronger, your feet should support less of your body weight as you lift yourself up. 4. Hold for __________ seconds. 5. Slowly lower yourself back into the chair. Repeat __________ times. Complete this exercise __________ times a  day. Exercise L: Wall Push-Ups  1. Stand so you are facing a stable wall. Your feet should be about one arm-length away from the wall. 2. Lean forward and place your palms on the wall at shoulder height. 3. Keep your feet flat on the floor as you bend your elbows and lean forward toward the wall. 4. Hold for __________ seconds. 5. Straighten your elbows to push yourself back to the starting position. Repeat __________ times. Complete this exercise __________ times a day. This information is not intended to replace advice   given to you by your health care provider. Make sure you discuss any questions you have with your health care provider. Document Released: 12/18/2004 Document Revised: 10/29/2015 Document Reviewed: 10/15/2014 Elsevier Interactive Patient Education  2018 [employer]  

## 2017-08-12 ENCOUNTER — Telehealth: Payer: Self-pay | Admitting: *Deleted

## 2017-08-12 DIAGNOSIS — E559 Vitamin D deficiency, unspecified: Secondary | ICD-10-CM

## 2017-08-12 MED ORDER — VITAMIN D (ERGOCALCIFEROL) 1.25 MG (50000 UNIT) PO CAPS: 50000.0000 [IU] | ORAL_CAPSULE | ORAL | 0 refills | Status: DC

## 2017-08-12 NOTE — Telephone Encounter (Signed)
-----   Message from Gearldine Bienenstockaylor M Dale, PA-C sent at 08/12/2017  8:14 AM EDT ----- CBC and CMP stable. Vitamin D is low.  Please send in vitamin D 50,000 units by mouth once a week for 3 months.  We will recheck vitamin D in 3 months.  All other labs are WNL.

## 2017-08-12 NOTE — Progress Notes (Signed)
CBC and CMP stable. Vitamin D is low.  Please send in vitamin D 50,000 units by mouth once a week for 3 months.  We will recheck vitamin D in 3 months.  All other labs are WNL.

## 2017-08-13 LAB — URINALYSIS, ROUTINE W REFLEX MICROSCOPIC
BILIRUBIN URINE: NEGATIVE
GLUCOSE, UA: NEGATIVE
HGB URINE DIPSTICK: NEGATIVE
KETONES UR: NEGATIVE
Leukocytes, UA: NEGATIVE
Nitrite: NEGATIVE
PH: 7 (ref 5.0–8.0)
PROTEIN: NEGATIVE
Specific Gravity, Urine: 1.005 (ref 1.001–1.03)

## 2017-08-13 LAB — ANA: ANA: POSITIVE — AB

## 2017-08-13 LAB — ANTI-NUCLEAR AB-TITER (ANA TITER): ANA Titer 1: >=1:1280 {titer} — AB

## 2017-08-13 LAB — COMPLETE METABOLIC PANEL WITH GFR
AG Ratio: 1.3 (calc) (ref 1.0–2.5)
ALT: 16 U/L (ref 6–29)
AST: 20 U/L (ref 10–30)
Albumin: 4.4 g/dL (ref 3.6–5.1)
Alkaline phosphatase (APISO): 60 U/L (ref 33–115)
BILIRUBIN TOTAL: 0.4 mg/dL (ref 0.2–1.2)
BUN: 16 mg/dL (ref 7–25)
CHLORIDE: 102 mmol/L (ref 98–110)
CO2: 29 mmol/L (ref 20–32)
Calcium: 9.6 mg/dL (ref 8.6–10.2)
Creat: 0.96 mg/dL (ref 0.50–1.10)
GFR, EST AFRICAN AMERICAN: 92 mL/min/{1.73_m2} (ref >=60)
GFR, Est Non African American: 79 mL/min/{1.73_m2} (ref >=60)
GLUCOSE: 73 mg/dL (ref 65–99)
Globulin: 3.4 g/dL (calc) (ref 1.9–3.7)
Potassium: 4.3 mmol/L (ref 3.5–5.3)
Sodium: 134 mmol/L — ABNORMAL LOW (ref 135–146)
TOTAL PROTEIN: 7.8 g/dL (ref 6.1–8.1)

## 2017-08-13 LAB — CBC WITH DIFFERENTIAL/PLATELET
BASOS PCT: 0.5 %
Basophils Absolute: 32 cells/uL (ref 0–200)
EOS ABS: 109 {cells}/uL (ref 15–500)
Eosinophils Relative: 1.7 %
HCT: 39.5 % (ref 35.0–45.0)
Hemoglobin: 13.4 g/dL (ref 11.7–15.5)
Lymphs Abs: 1613 cells/uL (ref 850–3900)
MCH: 33.7 pg — AB (ref 27.0–33.0)
MCHC: 33.9 g/dL (ref 32.0–36.0)
MCV: 99.2 fL (ref 80.0–100.0)
MPV: 12 fL (ref 7.5–12.5)
Monocytes Relative: 8.6 %
Neutro Abs: 4096 cells/uL (ref 1500–7800)
Neutrophils Relative %: 64 %
Platelets: 196 10*3/uL (ref 140–400)
RBC: 3.98 10*6/uL (ref 3.80–5.10)
RDW: 11.9 % (ref 11.0–15.0)
TOTAL LYMPHOCYTE: 25.2 %
WBC mixed population: 550 cells/uL (ref 200–950)
WBC: 6.4 10*3/uL (ref 3.8–10.8)

## 2017-08-13 LAB — SEDIMENTATION RATE: SED RATE: 9 mm/h (ref 0–20)

## 2017-08-13 LAB — C3 AND C4
C3 Complement: 109 mg/dL (ref 83–193)
C4 COMPLEMENT: 17 mg/dL (ref 15–57)

## 2017-08-13 LAB — ANTI-DNA ANTIBODY, DOUBLE-STRANDED: ds DNA Ab: 2 IU/mL

## 2017-08-13 LAB — VITAMIN D 25 HYDROXY (VIT D DEFICIENCY, FRACTURES): Vit D, 25-Hydroxy: 28 ng/mL — ABNORMAL LOW (ref 30–100)

## 2017-08-13 NOTE — Progress Notes (Signed)
Complements are WNL.  DsDNA WNL. Sed rate WNL.  UA normal.

## 2017-08-14 ENCOUNTER — Ambulatory Visit: Payer: 59 | Admitting: Physician Assistant

## 2017-08-14 NOTE — Progress Notes (Signed)
ANA titer 1:1280. Her other lab work did not reveal a flare and ANA does not correlate with disease activity.

## 2017-08-17 ENCOUNTER — Telehealth: Payer: Self-pay | Admitting: Rheumatology

## 2017-08-17 MED ORDER — HYDROXYCHLOROQUINE SULFATE 200 MG PO TABS: 200.0000 mg | ORAL_TABLET | Freq: Two times a day (BID) | ORAL | 2 refills | Status: DC

## 2017-08-17 NOTE — Telephone Encounter (Signed)
Patient called requesting prescription refill of [facility] [address]

## 2017-08-17 NOTE — Telephone Encounter (Signed)
last Visit: 08/11/17 Next Visit: 01/11/18 Labs: 08/11/17 CBC and CMP stable PLQ Eye Exam: 03/27/17 WNL  Okay to refill per Dr. Corliss Skainseveshwar

## 2017-11-30 ENCOUNTER — Other Ambulatory Visit: Payer: Self-pay | Admitting: Rheumatology

## 2017-12-01 NOTE — Telephone Encounter (Signed)
Last Visit: 08/11/17 Next Visit: 01/11/18 Labs: 08/11/17 CBC and CMP stable PLQ Eye Exam: 03/27/17 WNL   Okay to refill per Dr. Corliss Skains

## 2017-12-28 NOTE — Progress Notes (Deleted)
Office Visit Note  Patient: Tammy Giles             Date of Birth: 1985/07/22           MRN: [id number]             PCP: Tammy Pear, MD Referring: Tammy Pear, MD Visit Date: 01/11/2018 Occupation: @GUAROCC @  Subjective:  No chief complaint on file.  Current regimen includes Plaquenil 200 mg twice daily.  Last PLQ eye exam normal on 03/27/17.  Most recent CBC/CMP stable and autoimmune labs normal on 08/12/17.  Due for CBC/CMP today and then every 5 months.  Standing orders placed today. Recommend annual flu and Pneumovax 23 as indicated.  Vitamin D level 28 on 08/11/17.  Patient completed 12 weeks of 50,000 units weekly.  Vitamin D level due today.  History of Present Illness: Tammy Giles is a 32 y.o. female with history of systemic lupus erythematosus and Raynauds.  Activities of Daily Living:  Patient reports morning stiffness for *** {minute/hour:19697}.   Patient {ACTIONS;DENIES/REPORTS:21021675::"Denies"} nocturnal pain.  Difficulty dressing/grooming: {ACTIONS;DENIES/REPORTS:21021675::"Denies"} Difficulty climbing stairs: {ACTIONS;DENIES/REPORTS:21021675::"Denies"} Difficulty getting out of chair: {ACTIONS;DENIES/REPORTS:21021675::"Denies"} Difficulty using hands for taps, buttons, cutlery, and/or writing: {ACTIONS;DENIES/REPORTS:21021675::"Denies"}  No Rheumatology ROS completed.   PMFS History:  Patient Active Problem List   Diagnosis Date Noted  . Class 2 obesity without serious comorbidity with body mass index (BMI) of 35.0 to 35.9 in adult 02/13/2017  . Raynaud's syndrome without gangrene 02/13/2017  . High risk medication use 02/13/2017  . Dyslipidemia 12/12/2015  . Vitamin D deficiency 12/12/2015  . Annual physical exam 12/11/2015  . Migraine without aura and with status migrainosus, not intractable 12/11/2015  . Obesity (BMI 35.0-39.9 without comorbidity) 12/11/2015  . Encounter for long-term (current) use of high-risk medication 04/26/2014  . Abnormal  Pap smear of cervix 06/16/2013    Class: History of  . Lupus (systemic lupus erythematosus) (HCC) 10/22/2011    Past Medical History:  Diagnosis Date  . Abnormal Pap smear of cervix    LGSIL 2014 & 2015, ASCUS HPV HR+ 2016, ASCUS HPV HR+ 2017  . 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 Irregular periods    has IUD  . Lupus (HCC)   . Migraines   . STD (sexually transmitted disease)    HSV2    Family History  Problem Relation Age of Onset  . Hypertension Mother   . Hypertension Father   . Depression Father   . Prostate cancer Father   . Thyroid disease Sister    Past Surgical History:  Procedure Laterality Date  . ACHILLES TENDON SURGERY Left 05/24/2014   Procedure: LEFT ACHILLES TENDON REPAIR;  Surgeon: Tammy Kos, MD;  Location: [facility];  Service: Orthopedics;  Laterality: Left;  . COLPOSCOPY     2014, 2015, 2016, 2017 LGSIL  . INTRAUTERINE DEVICE (IUD) INSERTION     insertion 07-18-16  . WISDOM TOOTH EXTRACTION     Social History   Social History Narrative  . Not on file    Objective: Vital Signs: There were no vitals taken for this visit.   Physical Exam   Musculoskeletal Exam: ***  CDAI Exam: CDAI Score: Not documented Patient Global Assessment: Not documented; Provider Global Assessment: Not documented Swollen: Not documented; Tender: Not documented Joint Exam   Not documented   There is currently no information documented on the homunculus. Go to the Rheumatology activity and complete the homunculus joint exam.  Investigation: No additional findings.  Imaging:  No results found.  Recent Labs: Lab Results  Component Value Date   WBC 6.4 08/11/2017   HGB 13.4 08/11/2017   PLT 196 08/11/2017   NA 134 (L) 08/11/2017   K 4.3 08/11/2017   CL 102 08/11/2017   CO2 29 08/11/2017   GLUCOSE 73 08/11/2017   BUN 16 08/11/2017   CREATININE 0.96 08/11/2017   BILITOT 0.4 08/11/2017   AST 20 08/11/2017   ALT 16 08/11/2017   PROT 7.8  08/11/2017   CALCIUM 9.6 08/11/2017   GFRAA 92 08/11/2017   Component     Latest Ref Rng & Units 08/11/2017  C3 Complement     83 - 193 mg/dL 409  C4 Complement     15 - 57 mg/dL 17  ANA Pattern 1      HOMOGENEOUS (A)  ANA Titer 1     titer > OR = 1:1280 (A)  Anti Nuclear Antibody(ANA)     NEGATIVE POSITIVE (A)  ds DNA Ab     IU/mL 2  Sed Rate     0 - 20 mm/h 9   Speciality Comments: PLQ Eye Exam: 03/27/17 WNL @ [facility] Follow up in 1 year  Procedures:  No procedures performed Allergies: Other   Assessment / Plan:     Visit Diagnoses: Other systemic lupus erythematosus with other organ involvement (HCC) -  ANA >1:1280, + Smith, + RNP, + SSA  Raynaud's syndrome without gangrene  High risk medication use - PLQ 200 mg 1 tablet po BID. PLQ Eye Exam: 03/27/17 WNL @ [facility] Follow up in 1 year.    Vitamin D deficiency  Other fatigue  Palpitations  Dyslipidemia  Migraine    Orders: No orders of the defined types were placed in this encounter.  No orders of the defined types were placed in this encounter.   Face-to-face time spent with patient was *** minutes. Greater than 50% of time was spent in counseling and coordination of care.  Follow-Up Instructions: No follow-ups on file.   Tammy Bienenstock, PA-C  Note - This record has been created using Dragon software.  Chart creation errors have been sought, but may not always  have been located. Such creation errors do not reflect on  the standard of medical care.

## 2018-01-07 ENCOUNTER — Other Ambulatory Visit: Payer: Self-pay

## 2018-01-07 ENCOUNTER — Ambulatory Visit: Payer: 59 | Admitting: Certified Nurse Midwife

## 2018-01-07 ENCOUNTER — Other Ambulatory Visit (HOSPITAL_COMMUNITY)
Admission: RE | Admit: 2018-01-07 | Discharge: 2018-01-07 | Disposition: A | Discharge status: Home / Self Care | Payer: 59 | Source: Ambulatory Visit | Attending: Certified Nurse Midwife | Admitting: Certified Nurse Midwife

## 2018-01-07 ENCOUNTER — Encounter: Payer: Self-pay | Admitting: Certified Nurse Midwife

## 2018-01-07 VITALS — BP 110/70 | HR 70 | Resp 16 | Ht 65.25 in | Wt 244.0 lb

## 2018-01-07 DIAGNOSIS — Z01419 Encounter for gynecological examination (general) (routine) without abnormal findings: Secondary | ICD-10-CM

## 2018-01-07 DIAGNOSIS — Z124 Encounter for screening for malignant neoplasm of cervix: Secondary | ICD-10-CM | POA: Insufficient documentation

## 2018-01-07 DIAGNOSIS — Z30431 Encounter for routine checking of intrauterine contraceptive device: Secondary | ICD-10-CM | POA: Diagnosis not present

## 2018-01-07 DIAGNOSIS — M329 Systemic lupus erythematosus, unspecified: Secondary | ICD-10-CM

## 2018-01-07 DIAGNOSIS — IMO0002 Reserved for concepts with insufficient information to code with codable children: Secondary | ICD-10-CM

## 2018-01-07 NOTE — Progress Notes (Signed)
32 y.o. G0P0000 Single  African American Fe here for annual exam. Periods sporadic with Mirena IUD and happy with choice. Sees Dr. Shan Levanseveshar for Lupus management and labs, all stable. No HSV outbreaks, has Rx if needed. No partner change and no STD screening needed. No health issues today. Aware she has had weight gain and working on.  No LMP recorded. (Menstrual status: IUD).          Sexually active: Yes.    The current method of family planning is IUD.    Exercising: Yes.    high intensity training Smoker:  no  Review of Systems  Constitutional: Negative.   HENT: Negative.   Eyes: Negative.   Respiratory: Negative.   Cardiovascular: Negative.   Gastrointestinal: Negative.   Genitourinary:       Loss of sexual interest  Musculoskeletal: Negative.   Skin: Negative.   Neurological: Negative.   Endo/Heme/Allergies: Negative.   Psychiatric/Behavioral: Negative.     Health Maintenance: Pap:  10-13-14 ASCUS HPV HR +, 11-06-15 ASCUS HPV HR +, 12-26-16 LGSIL History of Abnormal Pap: needs paps yearly due to Lupus MMG:  none Self Breast exams: yes Colonoscopy:  none BMD:   none TDaP:  2018 Shingles: no Pneumonia: no Hep C and HIV: both neg 2018 Labs: if needed   reports that she has never smoked. She has never used smokeless tobacco. She reports that she drinks about 3.0 standard drinks of alcohol per week. She reports that she does not use drugs.  Past Medical History:  Diagnosis Date  . Abnormal Pap smear of cervix    LGSIL 2014 & 2015, ASCUS HPV HR+ 2016, ASCUS HPV HR+ 2017, 12-26-16 LGSIL  . 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 Irregular periods    has IUD  . Lupus (HCC)   . Migraines   . STD (sexually transmitted disease)    HSV2    Past Surgical History:  Procedure Laterality Date  . ACHILLES TENDON SURGERY Left 05/24/2014   Procedure: LEFT ACHILLES TENDON REPAIR;  Surgeon: Tarry KosNaiping M Xu, MD;  Location: [facility];  Service: Orthopedics;  Laterality: Left;   . COLPOSCOPY     2014, 2015, 2016, 2017 LGSIL, 2018  . INTRAUTERINE DEVICE (IUD) INSERTION     insertion 07-18-16  . WISDOM TOOTH EXTRACTION      Current Outpatient Medications  Medication Sig Dispense Refill  . hydroxychloroquine (PLAQUENIL) 200 MG tablet TAKE 1 TABLET(200 MG) BY MOUTH TWICE DAILY 60 tablet 2  . levonorgestrel (MIRENA) 20 MCG/24HR IUD 1 each by Intrauterine route once. Implanted Fall 2013    . Multiple Vitamins-Minerals (MULTIVITAMIN PO) Take by mouth.    Marland Kitchen. PROBIOTIC PRODUCT PO Take by mouth.    . valACYclovir (VALTREX) 500 MG tablet TAKE 1 TABLET BY MOUTH ONCE DAILY. INCREASE TO TWO  TIMES DAILY FOR 3 DAYS ONLY AT ONSET OF OUTBREAK 40 tablet 12  . Vitamin D, Ergocalciferol, (DRISDOL) 50000 units CAPS capsule Take 1 capsule (50,000 Units total) by mouth every 7 (seven) days. 12 capsule 0  . EPINEPHrine 0.3 mg/0.3 mL IJ SOAJ injection Inject 0.3 mg into the muscle as needed (allergic reaction).      No current facility-administered medications for this visit.     Family History  Problem Relation Age of Onset  . Hypertension Mother   . Hypertension Father   . Depression Father   . Prostate cancer Father   . Thyroid disease Sister     ROS:  Pertinent items are  noted in HPI.  Otherwise, a comprehensive ROS was negative.  Exam:   BP 110/70   Pulse 70   Resp 16   Ht 5' 5.25" (1.657 m)   Wt 244 lb (110.7 kg)   BMI 40.29 kg/m  Height: 5' 5.25" (165.7 cm) Ht Readings from Last 3 Encounters:  01/07/18 5' 5.25" (1.657 m)  08/11/17 5\' 5"  (1.651 m)  03/19/17 5\' 5"  (1.651 m)    General appearance: alert, cooperative and appears stated age Head: Normocephalic, without obvious abnormality, atraumatic Neck: no adenopathy, supple, symmetrical, trachea midline and thyroid normal to inspection and palpation Lungs: clear to auscultation bilaterally Breasts: normal appearance, no masses or tenderness, No nipple retraction or dimpling, No nipple discharge or bleeding, No  axillary or supraclavicular adenopathy Heart: regular rate and rhythm Abdomen: soft, non-tender; no masses,  no organomegaly Extremities: extremities normal, atraumatic, no cyanosis or edema Skin: Skin color, texture, turgor normal. No rashes or lesions Lymph nodes: Cervical, supraclavicular, and axillary nodes normal. No abnormal inguinal nodes palpated Neurologic: Grossly normal   Pelvic: External genitalia:  no lesions              Urethra:  normal appearing urethra with no masses, tenderness or lesions              Bartholin's and Skene's: normal                 Vagina: normal appearing vagina with normal color and discharge, no lesions              Cervix: no cervical motion tenderness and no lesions              Pap taken: Yes.   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  Continue follow up with Neurology as indicated  Pap smear: yes   counseled on breast self exam, STD prevention, HIV risk factors and prevention, feminine hygiene, adequate intake of calcium and vitamin D, diet and exercise  return annually or prn  An After Visit Summary was printed and given to the patient.

## 2018-01-07 NOTE — Patient Instructions (Signed)

## 2018-01-11 ENCOUNTER — Ambulatory Visit: Payer: 59 | Admitting: Physician Assistant

## 2018-01-12 ENCOUNTER — Encounter: Payer: Self-pay | Admitting: Physician Assistant

## 2018-01-12 ENCOUNTER — Ambulatory Visit: Payer: 59 | Admitting: Physician Assistant

## 2018-01-12 VITALS — BP 142/95 | HR 71 | Resp 14 | Ht 65.25 in | Wt 253.8 lb

## 2018-01-12 DIAGNOSIS — E559 Vitamin D deficiency, unspecified: Secondary | ICD-10-CM | POA: Diagnosis not present

## 2018-01-12 DIAGNOSIS — I7300 Raynaud's syndrome without gangrene: Secondary | ICD-10-CM | POA: Diagnosis not present

## 2018-01-12 DIAGNOSIS — R5383 Other fatigue: Secondary | ICD-10-CM

## 2018-01-12 DIAGNOSIS — Z79899 Other long term (current) drug therapy: Secondary | ICD-10-CM | POA: Diagnosis not present

## 2018-01-12 DIAGNOSIS — G43001 Migraine without aura, not intractable, with status migrainosus: Secondary | ICD-10-CM

## 2018-01-12 DIAGNOSIS — E785 Hyperlipidemia, unspecified: Secondary | ICD-10-CM

## 2018-01-12 DIAGNOSIS — R002 Palpitations: Secondary | ICD-10-CM

## 2018-01-12 DIAGNOSIS — M3219 Other organ or system involvement in systemic lupus erythematosus: Secondary | ICD-10-CM | POA: Diagnosis not present

## 2018-01-12 LAB — CYTOLOGY - PAP
Diagnosis: NEGATIVE
HPV: NOT DETECTED

## 2018-01-12 NOTE — Progress Notes (Signed)
Office Visit Note  Patient: Tammy Giles             Date of Birth: 01/08/1986           MRN: [id number]             PCP: [name], Shaili, MD Referring: Coralyn PearGay, Robert M, MD Visit Date: 01/12/2018 Occupation: @GUAROCC @  Subjective:  Fatigue   History of Present Illness: Tammy Giles is a 32 y.o. female with history of systemic lupus erythematosus.  She is on Plaquenil 200 mg 1 tablet by mouth twice daily.  She reports she has been having some increased lower back and right shoulder pain over the past 2-3 weeks.  She reports that her right shoulder causes discomfort at night when she is lying on her right side.  She states she continues to have good range of motion and is able to work out.  She has also noticed increased fatigue, which she attributes to not sleeping well lately.  She continues to go to the gym 4 times weekly.  She attributes her increased joint pain and fatigue to eating more recently.  She states that eating sugar including her trigger for increased joint pain for her.  She denies any recent facial rashes, hair loss, symptoms of Raynaud's, sicca symptoms, swollen lymph nodes, low-grade fevers.  She reports that she has not been taking vitamin D supplement recently.   Activities of Daily Living:  Patient reports morning stiffness for 0 minutes.   Patient Reports nocturnal pain.  Difficulty dressing/grooming: Denies Difficulty climbing stairs: Denies Difficulty getting out of chair: Denies Difficulty using hands for taps, buttons, cutlery, and/or writing: Denies  Review of Systems  Constitutional: Positive for fatigue.  HENT: Negative for mouth sores, trouble swallowing, trouble swallowing, mouth dryness and nose dryness.   Eyes: Negative for pain, redness, itching, visual disturbance and dryness.  Respiratory: Negative for cough, hemoptysis, shortness of breath, wheezing and difficulty breathing.   Cardiovascular: Negative for chest pain, palpitations, hypertension  and swelling in legs/feet.  Gastrointestinal: Negative for abdominal pain, blood in stool, constipation, diarrhea, nausea and vomiting.  Endocrine: Negative for increased urination.  Genitourinary: Negative for painful urination, nocturia and pelvic pain.  Musculoskeletal: Positive for arthralgias and joint pain. Negative for joint swelling, myalgias, muscle weakness, morning stiffness, muscle tenderness and myalgias.  Skin: Positive for rash. Negative for color change, pallor, hair loss, nodules/bumps, skin tightness, ulcers and sensitivity to sunlight.  Allergic/Immunologic: Negative for susceptible to infections.  Neurological: Positive for numbness. Negative for dizziness, light-headedness, headaches, memory loss and weakness.  Hematological: Negative for swollen glands.  Psychiatric/Behavioral: Negative for depressed mood, confusion and sleep disturbance. The patient is not nervous/anxious.     PMFS History:  Patient Active Problem List   Diagnosis Date Noted  . Class 2 obesity without serious comorbidity with body mass index (BMI) of 35.0 to 35.9 in adult 02/13/2017  . Raynaud's syndrome without gangrene 02/13/2017  . High risk medication use 02/13/2017  . Dyslipidemia 12/12/2015  . Vitamin D deficiency 12/12/2015  . Annual physical exam 12/11/2015  . Migraine without aura and with status migrainosus, not intractable 12/11/2015  . Obesity (BMI 35.0-39.9 without comorbidity) 12/11/2015  . Encounter for long-term (current) use of high-risk medication 04/26/2014  . Abnormal Pap smear of cervix 06/16/2013    Class: History of  . Lupus (systemic lupus erythematosus) (HCC) 10/22/2011    Past Medical History:  Diagnosis Date  . Abnormal Pap smear of cervix    LGSIL 2014 &  2015, ASCUS HPV HR+ 2016, ASCUS HPV HR+ 2017, 12-26-16 LGSIL  . 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 Irregular periods    has IUD  . Lupus (HCC)   . Migraines   . STD (sexually transmitted disease)    HSV2      Family History  Problem Relation Age of Onset  . Hypertension Mother   . Hypertension Father   . Depression Father   . Prostate cancer Father   . Thyroid disease Sister    Past Surgical History:  Procedure Laterality Date  . ACHILLES TENDON SURGERY Left 05/24/2014   Procedure: LEFT ACHILLES TENDON REPAIR;  Surgeon: Tarry Kos, MD;  Location: [facility];  Service: Orthopedics;  Laterality: Left;  . COLPOSCOPY     2014, 2015, 2016, 2017 LGSIL, 2018  . INTRAUTERINE DEVICE (IUD) INSERTION     insertion 07-18-16  . WISDOM TOOTH EXTRACTION     Social History   Social History Narrative  . Not on file    Objective: Vital Signs: BP (!) 142/95 (BP Location: Left Wrist, Patient Position: Sitting, Cuff Size: Normal)   Pulse 71   Resp 14   Ht 5' 5.25" (1.657 m)   Wt 253 lb 12.8 oz (115.1 kg)   BMI 41.91 kg/m    Physical Exam  Constitutional: She is oriented to person, place, and time. She appears well-developed and well-nourished.  HENT:  Head: Normocephalic and atraumatic.  No oral or nasal ulcerations.  No parotid swelling  Eyes: Conjunctivae and EOM are normal.  Neck: Normal range of motion.  Cardiovascular: Normal rate, regular rhythm, normal heart sounds and intact distal pulses.  Pulmonary/Chest: Effort normal and breath sounds normal.  Abdominal: Soft. Bowel sounds are normal.  Lymphadenopathy:    She has no cervical adenopathy.  Neurological: She is alert and oriented to person, place, and time.  Skin: Skin is warm and dry. Capillary refill takes less than 2 seconds.  No malar rash noted.  No digital ulcerations or signs of gangrene noted.   Psychiatric: She has a normal mood and affect. Her behavior is normal.  Nursing note and vitals reviewed.    Musculoskeletal Exam: C-spine, thoracic spine, lumbar spine good range of motion.  No midline spinal tenderness.  No SI joint tenderness.  Shoulder joints, elbow joints, wrist joints, MCPs, PIPs, DIPs good  range of motion no synovitis.  She has complete fist formation bilaterally.  Hip joints, knee joints, ankle joints, MTPs, PIPs, DIPs good range of motion with no synovitis.  No warmth or effusion bilateral knee joints.  No tenderness or swelling of ankle joints.  No tenderness of trochanter bursa bilaterally.  CDAI Exam: CDAI Score: Not documented Patient Global Assessment: Not documented; Provider Global Assessment: Not documented Swollen: Not documented; Tender: Not documented Joint Exam   Not documented   There is currently no information documented on the homunculus. Go to the Rheumatology activity and complete the homunculus joint exam.  Investigation: No additional findings.  Imaging: No results found.  Recent Labs: Lab Results  Component Value Date   WBC 6.4 08/11/2017   HGB 13.4 08/11/2017   PLT 196 08/11/2017   NA 134 (L) 08/11/2017   K 4.3 08/11/2017   CL 102 08/11/2017   CO2 29 08/11/2017   GLUCOSE 73 08/11/2017   BUN 16 08/11/2017   CREATININE 0.96 08/11/2017   BILITOT 0.4 08/11/2017   AST 20 08/11/2017   ALT 16 08/11/2017   PROT 7.8 08/11/2017   CALCIUM  9.6 08/11/2017   GFRAA 92 08/11/2017    Speciality Comments: PLQ Eye Exam: 03/27/17 WNL @ [facility] Follow up in 1 year  Procedures:  No procedures performed Allergies: Other   Assessment / Plan:     Visit Diagnoses: Other systemic lupus erythematosus with other organ involvement (HCC) - ANA >1:1280, + Smith, + RNP, + SSA: She has not had any recent lupus flares.  She is clinically doing well on Plaquenil 200 mg 1 tablet by mouth twice daily.  She had increased fatigue for the past 2 to 3 weeks as well as pain in her right shoulder and lower back.  She attributes the fatigue and increased joint pain to eating gluten and sugar seems to be a trigger for inflammation for her.  She has no oral or nasal ulcerations on exam.  She has not experienced any sicca symptoms and no parotid swelling was noted.  She has  not had any symptoms of Raynaud's recently and no digital ulcerations or signs of gangrene were noted.  No malar rash evident.  She denies any recent hair loss.  She denies any low-grade fevers recently.  No cervical lymphadenopathy was palpated.  She will continue on Plaquenil 200 mg 1 tablet by mouth twice daily.  She does not need a refill at this time.  We will check autoimmune labs today.  She was advised to notify us if she develops any new or worsening symptoms.  She will follow-up in the office in 5 months.- Plan: CBC with Differential/Platelet, COMPLETE METABOLIC PANEL WITH GFR, Urinalysis, Routine w reflex microscopic, Anti-DNA antibody, double-stranded, C3 and C4, Sedimentation rate, VITAMIN D 25 Hydroxy (Vit-D Deficiency, Fractures)  High risk medication use - PLQ 200 mg 1 tablet po BID. PLQ Eye Exam: 03/27/17 WNL @ [facility] Follow up in 1 year.  She was given a Plaquenil eye exam form to take with her to her next appointment.  We will check CBC and CMP today to monitor for drug toxicity.- Plan: CBC with Differential/Platelet, COMPLETE METABOLIC PANEL WITH GFR  Raynaud's syndrome without gangrene: She has not had any symptoms of Raynaud's recently.  No digital ulcerations or signs of gangrene were noted.  Vitamin D deficiency -we will check a vitamin D level today.  She has not been taking vitamin D supplement recently.  Most recent vitamin D level was 28.  Plan: VITAMIN D 25 Hydroxy (Vit-D Deficiency, Fractures)  Palpitations: She has not had any palpitations recently.   Other fatigue: She has had worsening fatigue for the past 2 to 3 weeks.  She has not been sleeping well at night recently.  We will check autoimmune labs today.  Other medical conditions are listed as follows:   Dyslipidemia  Migraine     Orders: Orders Placed This Encounter  Procedures  . CBC with Differential/Platelet  . COMPLETE METABOLIC PANEL WITH GFR  . Urinalysis, Routine w reflex microscopic  .  Anti-DNA antibody, double-stranded  . C3 and C4  . Sedimentation rate  . VITAMIN D 25 Hydroxy (Vit-D Deficiency, Fractures)   No orders of the defined types were placed in this encounter.    Follow-Up Instructions: Return in about 5 months (around 06/13/2018) for Systemic lupus erythematosus, Raynaud's syndrome.   Gearldine Bienenstock, PA-C  Note - This record has been created using Dragon software.  Chart creation errors have been sought, but may not always  have been located. Such creation errors do not reflect on  the standard of  medical care.

## 2018-01-13 ENCOUNTER — Telehealth: Payer: Self-pay | Admitting: *Deleted

## 2018-01-13 DIAGNOSIS — E559 Vitamin D deficiency, unspecified: Secondary | ICD-10-CM

## 2018-01-13 LAB — CBC WITH DIFFERENTIAL/PLATELET
BASOS PCT: 0.5 %
Basophils Absolute: 29 cells/uL (ref 0–200)
EOS ABS: 58 {cells}/uL (ref 15–500)
Eosinophils Relative: 1 %
HEMATOCRIT: 40.2 % (ref 35.0–45.0)
HEMOGLOBIN: 13.7 g/dL (ref 11.7–15.5)
LYMPHS ABS: 1705 {cells}/uL (ref 850–3900)
MCH: 33.6 pg — AB (ref 27.0–33.0)
MCHC: 34.1 g/dL (ref 32.0–36.0)
MCV: 98.5 fL (ref 80.0–100.0)
MONOS PCT: 9.2 %
MPV: 12.6 fL — AB (ref 7.5–12.5)
NEUTROS ABS: 3474 {cells}/uL (ref 1500–7800)
Neutrophils Relative %: 59.9 %
Platelets: 190 10*3/uL (ref 140–400)
RBC: 4.08 10*6/uL (ref 3.80–5.10)
RDW: 11.9 % (ref 11.0–15.0)
Total Lymphocyte: 29.4 %
WBC: 5.8 10*3/uL (ref 3.8–10.8)
WBCMIX: 534 {cells}/uL (ref 200–950)

## 2018-01-13 LAB — URINALYSIS, ROUTINE W REFLEX MICROSCOPIC
Bilirubin Urine: NEGATIVE
GLUCOSE, UA: NEGATIVE
HGB URINE DIPSTICK: NEGATIVE
Ketones, ur: NEGATIVE
Leukocytes, UA: NEGATIVE
Nitrite: NEGATIVE
PH: 7 (ref 5.0–8.0)
Protein, ur: NEGATIVE
Specific Gravity, Urine: 1.015 (ref 1.001–1.03)

## 2018-01-13 LAB — COMPLETE METABOLIC PANEL WITH GFR
AG Ratio: 1.3 (calc) (ref 1.0–2.5)
ALBUMIN MSPROF: 4.2 g/dL (ref 3.6–5.1)
ALT: 15 U/L (ref 6–29)
AST: 20 U/L (ref 10–30)
Alkaline phosphatase (APISO): 67 U/L (ref 33–115)
BUN: 14 mg/dL (ref 7–25)
CHLORIDE: 104 mmol/L (ref 98–110)
CO2: 27 mmol/L (ref 20–32)
Calcium: 9.4 mg/dL (ref 8.6–10.2)
Creat: 0.87 mg/dL (ref 0.50–1.10)
GFR, EST AFRICAN AMERICAN: 104 mL/min/{1.73_m2} (ref >=60)
GFR, Est Non African American: 89 mL/min/{1.73_m2} (ref >=60)
GLOBULIN: 3.3 g/dL (ref 1.9–3.7)
GLUCOSE: 74 mg/dL (ref 65–99)
Potassium: 4 mmol/L (ref 3.5–5.3)
SODIUM: 138 mmol/L (ref 135–146)
TOTAL PROTEIN: 7.5 g/dL (ref 6.1–8.1)
Total Bilirubin: 0.5 mg/dL (ref 0.2–1.2)

## 2018-01-13 LAB — VITAMIN D 25 HYDROXY (VIT D DEFICIENCY, FRACTURES): VIT D 25 HYDROXY: 21 ng/mL — AB (ref 30–100)

## 2018-01-13 LAB — C3 AND C4
C3 COMPLEMENT: 119 mg/dL (ref 83–193)
C4 Complement: 18 mg/dL (ref 15–57)

## 2018-01-13 LAB — SEDIMENTATION RATE: Sed Rate: 11 mm/h (ref 0–20)

## 2018-01-13 LAB — ANTI-DNA ANTIBODY, DOUBLE-STRANDED: ds DNA Ab: 2 IU/mL

## 2018-01-13 MED ORDER — VITAMIN D (ERGOCALCIFEROL) 1.25 MG (50000 UNIT) PO CAPS: 50000.0000 [IU] | ORAL_CAPSULE | ORAL | 0 refills | Status: DC

## 2018-01-13 NOTE — Progress Notes (Signed)
DsDNA is 2. No signs of a lupus flare.

## 2018-01-13 NOTE — Progress Notes (Signed)
Vitamin D is low-21. Please send in vitamin D 50,000 units by mouth once a week for 3 months.  We will recheck vitamin D in 3 months.  CBC stable. CMP WNL. UA WNL. Sed rate WNL. Complements WNL

## 2018-01-13 NOTE — Telephone Encounter (Signed)
-----   Message from Gearldine Bienenstockaylor M Dale, PA-C sent at 01/13/2018  9:14 AM EST ----- Vitamin D is low-21. Please send in vitamin D 50,000 units by mouth once a week for 3 months.  We will recheck vitamin D in 3 months.  CBC stable. CMP WNL. UA WNL. Sed rate WNL. Complements WNL

## 2018-01-31 ENCOUNTER — Other Ambulatory Visit: Payer: Self-pay | Admitting: Certified Nurse Midwife

## 2018-02-01 NOTE — Telephone Encounter (Signed)
Medication refill request: Valtrex 500 mg  Last AEX:  01/07/18 with DL Next AEX: 16/11/9610/20/20 Last MMG (if hormonal medication request): NA Refill authorized: #[id number] with 0 RF

## 2018-02-17 ENCOUNTER — Emergency Department (HOSPITAL_COMMUNITY)
Admission: EM | Admit: 2018-02-17 | Discharge: 2018-02-17 | Disposition: A | Discharge status: Home / Self Care | Payer: 59 | Attending: Emergency Medicine | Admitting: Emergency Medicine

## 2018-02-17 ENCOUNTER — Encounter (HOSPITAL_COMMUNITY): Payer: Self-pay | Admitting: Emergency Medicine

## 2018-02-17 ENCOUNTER — Other Ambulatory Visit: Payer: Self-pay

## 2018-02-17 DIAGNOSIS — M329 Systemic lupus erythematosus, unspecified: Secondary | ICD-10-CM | POA: Diagnosis not present

## 2018-02-17 DIAGNOSIS — Z79899 Other long term (current) drug therapy: Secondary | ICD-10-CM | POA: Insufficient documentation

## 2018-02-17 DIAGNOSIS — R1032 Left lower quadrant pain: Secondary | ICD-10-CM | POA: Diagnosis not present

## 2018-02-17 DIAGNOSIS — F1010 Alcohol abuse, uncomplicated: Secondary | ICD-10-CM | POA: Diagnosis not present

## 2018-02-17 DIAGNOSIS — R112 Nausea with vomiting, unspecified: Secondary | ICD-10-CM | POA: Diagnosis not present

## 2018-02-17 LAB — I-STAT CHEM 8, ED
BUN: 20 mg/dL (ref 6–20)
CALCIUM ION: 1.18 mmol/L (ref 1.15–1.40)
Chloride: 105 mmol/L (ref 98–111)
Creatinine, Ser: 0.9 mg/dL (ref 0.44–1.00)
Glucose, Bld: 88 mg/dL (ref 70–99)
HCT: 44 % (ref 36.0–46.0)
Hemoglobin: 15 g/dL (ref 12.0–15.0)
Potassium: 4 mmol/L (ref 3.5–5.1)
Sodium: 139 mmol/L (ref 135–145)
TCO2: 28 mmol/L (ref 22–32)

## 2018-02-17 LAB — I-STAT BETA HCG BLOOD, ED (MC, WL, AP ONLY)

## 2018-02-17 MED ORDER — ONDANSETRON HCL 4 MG PO TABS: 4.0000 mg | ORAL_TABLET | Freq: Four times a day (QID) | ORAL | 0 refills | Status: DC

## 2018-02-17 MED ORDER — ONDANSETRON 4 MG PO TBDP
4.0000 mg | ORAL_TABLET | Freq: Once | ORAL | Status: AC | PRN
  Administered 2018-02-17: 4 mg via ORAL
  Filled 2018-02-17: qty 1

## 2018-02-17 NOTE — ED Triage Notes (Signed)
Pt complaint of emesis post ETOH use last night; denies other.

## 2018-02-17 NOTE — Discharge Instructions (Addendum)
Please take medication as directed. Stay well hydrated for the next several days.   Please follow up with your primary doctor within the next 5-7 days.  If you do not have a primary care provider, information for a healthcare clinic has been provided for you to make arrangements for follow up care. Please return to the ER sooner if you have any new or worsening symptoms, or if you have any of the following symptoms:  Abdominal pain that does not go away.  You have a fever.  You keep throwing up (vomiting).  The pain is felt only in portions of the abdomen. Pain in the right side could possibly be appendicitis. In an adult, pain in the left lower portion of the abdomen could be colitis or diverticulitis.  You pass bloody or black tarry stools.  There is bright red blood in the stool.  The constipation stays for more than 4 days.  There is belly (abdominal) or rectal pain.  You do not seem to be getting better.  You have any questions or concerns.

## 2018-02-17 NOTE — ED Provider Notes (Signed)
[facility]-EMERGENCY DEPT Provider Note   CSN: [id number] Arrival date & time: 02/17/18  1326     History   Chief Complaint Chief Complaint  Patient presents with  . Emesis    HPI Tammy Giles is a 33 y.o. female.  HPI   Pt is a 33 y/o female with a h/o lupus, migraines, who presents to the ED today c/o nausea and vomiting that began last night. States she drank a lot of ETOH last night and vomited one time. This AM she vomited and was unable to keep fluids down. States she lost count but estimates 10 times. No abd pain, diarrhea. No constipation or urinary sxs. She reports chills but has no taken temp. Sxs have improved after receiving zofran in triage and she has been able to tolerate water since then.   Past Medical History:  Diagnosis Date  . Abnormal Pap smear of cervix    LGSIL 2014 & 2015, ASCUS HPV HR+ 2016, ASCUS HPV HR+ 2017, 12-26-16 LGSIL  . 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 Irregular periods    has IUD  . Lupus (HCC)   . Migraines   . STD (sexually transmitted disease)    HSV2    Patient Active Problem List   Diagnosis Date Noted  . Class 2 obesity without serious comorbidity with body mass index (BMI) of 35.0 to 35.9 in adult 02/13/2017  . Raynaud's syndrome without gangrene 02/13/2017  . High risk medication use 02/13/2017  . Dyslipidemia 12/12/2015  . Vitamin D deficiency 12/12/2015  . Annual physical exam 12/11/2015  . Migraine without aura and with status migrainosus, not intractable 12/11/2015  . Obesity (BMI 35.0-39.9 without comorbidity) 12/11/2015  . Encounter for long-term (current) use of high-risk medication 04/26/2014  . Abnormal Pap smear of cervix 06/16/2013    Class: History of  . Lupus (systemic lupus erythematosus) (HCC) 10/22/2011    Past Surgical History:  Procedure Laterality Date  . ACHILLES TENDON SURGERY Left 05/24/2014   Procedure: LEFT ACHILLES TENDON REPAIR;  Surgeon: Tarry Kos, MD;  Location: [facility];  Service: Orthopedics;  Laterality: Left;  . COLPOSCOPY     2014, 2015, 2016, 2017 LGSIL, 2018  . INTRAUTERINE DEVICE (IUD) INSERTION     insertion 07-18-16  . WISDOM TOOTH EXTRACTION       OB History    Gravida  0   Para  0   Term  0   Preterm  0   AB  0   Living  0     SAB  0   TAB  0   Ectopic  0   Multiple  0   Live Births               Home Medications    Prior to Admission medications   Medication Sig Start Date End Date Taking? Authorizing Provider  hydroxychloroquine (PLAQUENIL) 200 MG tablet TAKE 1 TABLET(200 MG) BY MOUTH TWICE DAILY Patient taking differently: Take 200 mg by mouth 2 (two) times daily.  12/01/17  Yes Deveshwar, Janalyn Rouse, MD  levonorgestrel (MIRENA) 20 MCG/24HR IUD 1 each by Intrauterine route once. Implanted Fall 2013   Yes [provider]  Multiple Vitamins-Minerals (MULTIVITAMIN PO) Take by mouth.   Yes [provider]  naproxen sodium (ALEVE) 220 MG tablet Take 440 mg by mouth daily.   Yes [provider]  Omega-3 Fatty Acids (FISH OIL PO) Take 2 capsules by mouth daily.   Yes [provider]  PROBIOTIC PRODUCT PO Take 1 tablet by mouth daily.    Yes [provider]  valACYclovir (VALTREX) 500 MG tablet TAKE 1 TABLET BY MOUTH ONCE DAILY. INCREASE TO TWO  TIMES DAILY FOR 3 DAYS ONLY AT ONSET OF OUTBREAK Patient taking differently: Take 500 mg by mouth daily. TAKE 1 TABLET BY MOUTH ONCE DAILY. INCREASE TO TWO  TIMES DAILY FOR 3 DAYS ONLY AT ONSET OF OUTBREAK 02/02/18  Yes Verner CholLeonard, Deborah S, CNM  Vitamin D, Ergocalciferol, (DRISDOL) 1.25 MG (50000 UT) CAPS capsule Take 1 capsule (50,000 Units total) by mouth every 7 (seven) days. 01/13/18  Yes Deveshwar, Janalyn RouseShaili, MD  EPINEPHrine 0.3 mg/0.3 mL IJ SOAJ injection Inject 0.3 mg into the muscle as needed (allergic reaction).     [provider]  ondansetron (ZOFRAN) 4 MG tablet Take 1 tablet (4 mg total) by mouth every 6  (six) hours. 02/17/18   [name], PA-C  Vitamin D, Ergocalciferol, (DRISDOL) 50000 units CAPS capsule Take 1 capsule (50,000 Units total) by mouth every 7 (seven) days. Patient not taking: Reported on 02/17/2018 08/12/17   [name], Shaili, MD    Family History Family History  Problem Relation Age of Onset  . Hypertension Mother   . Hypertension Father   . Depression Father   . Prostate cancer Father   . Thyroid disease Sister     Social History Social History   Tobacco Use  . Smoking status: Never Smoker  . Smokeless tobacco: Never Used  Substance Use Topics  . Alcohol use: Yes    Alcohol/week: 3.0 standard drinks    Types: 3 Standard drinks or equivalent per week  . Drug use: No     Allergies   Other   Review of Systems Review of Systems  Constitutional: Positive for chills. Negative for fever.  HENT: Negative for ear pain and sore throat.   Eyes: Negative for pain and visual disturbance.  Respiratory: Negative for cough and shortness of breath.   Cardiovascular: Negative for chest pain.  Gastrointestinal: Positive for nausea and vomiting. Negative for abdominal pain, constipation and diarrhea.  Genitourinary: Negative for dysuria and hematuria.  Musculoskeletal: Negative for back pain.  Skin: Negative for rash.  Neurological: Negative for headaches.  All other systems reviewed and are negative.    Physical Exam Updated Vital Signs BP (!) 140/103 (BP Location: Left Arm)   Pulse 82   Temp 98.2 F (36.8 C) (Oral)   Resp 16   LMP 02/13/2018   SpO2 100%   Physical Exam Vitals signs and nursing note reviewed.  Constitutional:      General: She is not in acute distress.    Appearance: She is well-developed. She is not ill-appearing or toxic-appearing.  HENT:     Head: Normocephalic and atraumatic.     Mouth/Throat:     Mouth: Mucous membranes are moist.  Eyes:     Conjunctiva/sclera: Conjunctivae normal.  Neck:     Musculoskeletal: Neck supple.    Cardiovascular:     Rate and Rhythm: Normal rate and regular rhythm.     Heart sounds: Normal heart sounds. No murmur.  Pulmonary:     Effort: Pulmonary effort is normal. No respiratory distress.     Breath sounds: Normal breath sounds. No wheezing or rhonchi.  Abdominal:     General: Bowel sounds are normal.     Palpations: Abdomen is soft.     Tenderness: There is no right CVA tenderness or left CVA tenderness.  Comments: Mild llq ttp without rebound, rigidity or guarding.  Skin:    General: Skin is warm and dry.  Neurological:     Mental Status: She is alert.     ED Treatments / Results  Labs (all labs ordered are listed, but only abnormal results are displayed) Labs Reviewed  I-STAT CHEM 8, ED  I-STAT BETA HCG BLOOD, ED ([facility], [facility], [facility] ONLY)    EKG None  Radiology No results found.  Procedures Procedures (including critical care time)  Medications Ordered in ED Medications  ondansetron (ZOFRAN-ODT) disintegrating tablet 4 mg (4 mg Oral Given 02/17/18 1343)     Initial Impression / Assessment and Plan / ED Course  I have reviewed the triage vital signs and the nursing notes.  Pertinent labs & imaging results that were available during my care of the patient were reviewed by me and considered in my medical decision making (see chart for details).     Final Clinical Impressions(s) / ED Diagnoses   Final diagnoses:  Non-intractable vomiting with nausea, unspecified vomiting type   Patient presenting with nausea and vomiting after drinking heavy amounts of alcohol last night.  Had one episode of vomiting yesterday and several episodes today to the point that she cannot keep fluids down.  She was given Zofran in triage and since then she has been able to tolerate water.  On exam she does have some very mild left lower quadrant tenderness without rebound rigidity or guarding.  No CVA tenderness bilaterally.  She has no pain without palpation.  No fevers, vital signs  are normal.  I-STAT Chem-8 is normal and she has normal electrolytes.  Pregnancy test is negative.  She has no urinary symptoms.  Have advised patient to monitor her symptoms.  Will give Rx for antiemetics.  Visor return to the ER for new or worsening symptoms in the meantime.  She voiced understanding the plan agrees to return to the ED.  All questions answered.  ED Discharge Orders         Ordered    ondansetron (ZOFRAN) 4 MG tablet  Every 6 hours     02/17/18 [address], Amarien Carne S, PA-C 02/17/18 1635    Linwood Dibbles, MD 02/20/18 [id number]

## 2018-02-19 ENCOUNTER — Other Ambulatory Visit: Payer: Self-pay | Admitting: *Deleted

## 2018-02-19 MED ORDER — HYDROXYCHLOROQUINE SULFATE 200 MG PO TABS: ORAL_TABLET | ORAL | 2 refills | Status: DC

## 2018-02-19 NOTE — Telephone Encounter (Signed)
Refill request received via fax  Last Visit: 01/12/18 Next Visit: 06/15/18 Labs: 01/12/18 CBC stable. CMP WNL. Eye exam:  03/27/17 WNL   Okay to refill Plaquenil per Dr. Corliss Skains

## 2018-02-24 ENCOUNTER — Telehealth: Payer: Self-pay | Admitting: Rheumatology

## 2018-02-24 NOTE — Telephone Encounter (Signed)
Patient request refill on Plaquenil sent to [facility] on [address].

## 2018-02-24 NOTE — Telephone Encounter (Signed)
Patient advised her prescription was sent to the [facility] on [address] on 02/19/18 as they sent a refill request on her behalf. Patient advised she may contact the pharmacy on [address] and they can coordinate the transfer to the other pharmacy. Patient states she will pick it up for the [facility] on [address].

## 2018-03-04 ENCOUNTER — Emergency Department (HOSPITAL_COMMUNITY)
Admission: EM | Admit: 2018-03-04 | Discharge: 2018-03-05 | Disposition: A | Discharge status: Home / Self Care | Payer: 59 | Attending: Emergency Medicine | Admitting: Emergency Medicine

## 2018-03-04 ENCOUNTER — Emergency Department (HOSPITAL_COMMUNITY): Payer: 59

## 2018-03-04 ENCOUNTER — Encounter (HOSPITAL_COMMUNITY): Payer: Self-pay

## 2018-03-04 ENCOUNTER — Ambulatory Visit (HOSPITAL_COMMUNITY)
Admission: EM | Admit: 2018-03-04 | Discharge: 2018-03-04 | Disposition: A | Discharge status: Home / Self Care | Payer: 59 | Source: Home / Self Care

## 2018-03-04 DIAGNOSIS — T7840XA Allergy, unspecified, initial encounter: Secondary | ICD-10-CM | POA: Insufficient documentation

## 2018-03-04 DIAGNOSIS — Z79899 Other long term (current) drug therapy: Secondary | ICD-10-CM | POA: Insufficient documentation

## 2018-03-04 DIAGNOSIS — J069 Acute upper respiratory infection, unspecified: Secondary | ICD-10-CM | POA: Insufficient documentation

## 2018-03-04 LAB — CBC
HCT: 41.4 % (ref 36.0–46.0)
Hemoglobin: 13.8 g/dL (ref 12.0–15.0)
MCH: 32.7 pg (ref 26.0–34.0)
MCHC: 33.3 g/dL (ref 30.0–36.0)
MCV: 98.1 fL (ref 80.0–100.0)
Platelets: 180 10*3/uL (ref 150–400)
RBC: 4.22 MIL/uL (ref 3.87–5.11)
RDW: 12.1 % (ref 11.5–15.5)
WBC: 4.3 10*3/uL (ref 4.0–10.5)
nRBC: 0 % (ref 0.0–0.2)

## 2018-03-04 LAB — BASIC METABOLIC PANEL
Anion gap: 8 (ref 5–15)
BUN: 8 mg/dL (ref 6–20)
CO2: 22 mmol/L (ref 22–32)
Calcium: 9 mg/dL (ref 8.9–10.3)
Chloride: 105 mmol/L (ref 98–111)
Creatinine, Ser: 0.9 mg/dL (ref 0.44–1.00)
GFR calc non Af Amer: >60 mL/min (ref >60)
Glucose, Bld: 102 mg/dL — ABNORMAL HIGH (ref 70–99)
Potassium: 4.2 mmol/L (ref 3.5–5.1)
Sodium: 135 mmol/L (ref 135–145)

## 2018-03-04 MED ORDER — DIPHENHYDRAMINE HCL 25 MG PO CAPS
25.0000 mg | ORAL_CAPSULE | Freq: Once | ORAL | Status: AC
  Administered 2018-03-04: 25 mg via ORAL
  Filled 2018-03-04: qty 1

## 2018-03-04 NOTE — ED Notes (Signed)
This RN called to registration to assess patient.  Pt c/o SOB, throat closing and tongue swelling, with rash and new medications this week.  Assessed by MD Chaney Malling and encouraged to go to ER.  This RN accompanied patient to ER in wheelchair and ensured patient checked in prior to leaving.

## 2018-03-04 NOTE — ED Notes (Signed)
Pt reporting worsening rash and feels like her breathing is worse. Breath sounds are clear, she does have a cough (being treated for bronchitis). No oral swelling noted, denies difficulty swallowing.

## 2018-03-04 NOTE — ED Triage Notes (Signed)
Pt here with concern for an allergic reaction.  On Abx amoxicillin and prednisone. Has rash on arms and torso.  Shortness of breath started this evening and progressively got worse.  A&Ox4 talking in complete sentences.

## 2018-03-05 MED ORDER — DIPHENHYDRAMINE HCL 25 MG PO TABS: 25.0000 mg | ORAL_TABLET | Freq: Four times a day (QID) | ORAL | 0 refills | Status: AC | PRN

## 2018-03-05 MED ORDER — EPINEPHRINE 0.3 MG/0.3ML IJ SOAJ: 0.3000 mg | INTRAMUSCULAR | 0 refills | Status: AC | PRN

## 2018-03-05 MED ORDER — BENZONATATE 100 MG PO CAPS: 100.0000 mg | ORAL_CAPSULE | Freq: Three times a day (TID) | ORAL | 0 refills | Status: DC

## 2018-03-05 MED ORDER — ALBUTEROL SULFATE HFA 108 (90 BASE) MCG/ACT IN AERS
2.0000 | INHALATION_SPRAY | RESPIRATORY_TRACT | Status: DC | PRN
  Administered 2018-03-05: 2 via RESPIRATORY_TRACT
  Filled 2018-03-05: qty 6.7

## 2018-03-05 MED ORDER — FAMOTIDINE 20 MG PO TABS
20.0000 mg | ORAL_TABLET | Freq: Once | ORAL | Status: AC
  Administered 2018-03-05: 20 mg via ORAL
  Filled 2018-03-05: qty 1

## 2018-03-05 MED ORDER — FAMOTIDINE 20 MG PO TABS: 20.0000 mg | ORAL_TABLET | Freq: Two times a day (BID) | ORAL | 0 refills | Status: DC

## 2018-03-05 MED ORDER — DIPHENHYDRAMINE HCL 25 MG PO CAPS
25.0000 mg | ORAL_CAPSULE | Freq: Once | ORAL | Status: AC
  Administered 2018-03-05: 25 mg via ORAL
  Filled 2018-03-05: qty 1

## 2018-03-05 MED ORDER — ALBUTEROL SULFATE (2.5 MG/3ML) 0.083% IN NEBU
5.0000 mg | INHALATION_SOLUTION | Freq: Once | RESPIRATORY_TRACT | Status: AC
  Administered 2018-03-05: 5 mg via RESPIRATORY_TRACT
  Filled 2018-03-05: qty 6

## 2018-03-05 MED ORDER — IPRATROPIUM BROMIDE 0.02 % IN SOLN
0.5000 mg | Freq: Once | RESPIRATORY_TRACT | Status: AC
  Administered 2018-03-05: 0.5 mg via RESPIRATORY_TRACT
  Filled 2018-03-05: qty 2.5

## 2018-03-05 NOTE — Discharge Instructions (Addendum)
Use medications as prescribed. Finish your prednisone prescription as written. Keep your scheduled appointment with your doctor for this coming Monday. REturn here if symptoms worsen.

## 2018-03-05 NOTE — ED Notes (Signed)
Called pt x2 for room, no response. 

## 2018-03-05 NOTE — ED Provider Notes (Signed)
[facility] EM[facility]GENCY [facility] Provider Note   CSN: [id number] Arrival date & time: 03/04/18  1854     History   Chief Complaint Chief Complaint  Patient presents with  . Allergic Reaction    HPI Tammy Giles is a 33 y.o. female.  Patient to ED for evaluation of possible allergic reaction. She was started on Amoxicillin 9 days ago for upper respiratory symptoms that included cough and congestion progressive over several weeks. She felt her symptoms were unchanged so she stopped taking the amoxicillin after 4-5 days and started taking prednisone 4 days ago in a taper dose starting with 50 mg, tapering down 10 mg each day. On day 2 of the prednisone she noticed a rash, predominantly to UE's that spread over the next 2 days to back, face and thighs. She felt her respiratory symptoms were worsening to include facial swelling of cheeks and lips. No difficulty swallowing. She had worsening cough and noted a low grade fever as well. She presents tonight for concern of worsening allergic reaction.   The history is provided by the patient. No language interpreter was used.  Allergic Reaction  Presenting symptoms: rash     Past Medical History:  Diagnosis Date  . Abnormal Pap smear of cervix    LGSIL 2014 & 2015, ASCUS HPV HR+ 2016, ASCUS HPV HR+ 2017, 12-26-16 LGSIL  . 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 Irregular periods    has IUD  . Lupus (HCC)   . Migraines   . STD (sexually transmitted disease)    HSV2    Patient Active Problem List   Diagnosis Date Noted  . Class 2 obesity without serious comorbidity with body mass index (BMI) of 35.0 to 35.9 in adult 02/13/2017  . Raynaud's syndrome without gangrene 02/13/2017  . High risk medication use 02/13/2017  . Dyslipidemia 12/12/2015  . Vitamin D deficiency 12/12/2015  . Annual physical exam 12/11/2015  . Migraine without aura and with status migrainosus, not intractable 12/11/2015  . Obesity (BMI 35.0-39.9  without comorbidity) 12/11/2015  . Encounter for long-term (current) use of high-risk medication 04/26/2014  . Abnormal Pap smear of cervix 06/16/2013    Class: History of  . Lupus (systemic lupus erythematosus) (HCC) 10/22/2011    Past Surgical History:  Procedure Laterality Date  . ACHILLES TENDON SURGERY Left 05/24/2014   Procedure: LEFT ACHILLES TENDON REPAIR;  Surgeon: Tarry KosNaiping M Xu, MD;  Location: [facility];  Service: Orthopedics;  Laterality: Left;  . COLPOSCOPY     2014, 2015, 2016, 2017 LGSIL, 2018  . INTRAUTERINE DEVICE (IUD) INSERTION     insertion 07-18-16  . WISDOM TOOTH EXTRACTION       OB History    Gravida  0   Para  0   Term  0   Preterm  0   AB  0   Living  0     SAB  0   TAB  0   Ectopic  0   Multiple  0   Live Births               Home Medications    Prior to Admission medications   Medication Sig Start Date End Date Taking? Authorizing Provider  EPINEPHrine 0.3 mg/0.3 mL IJ SOAJ injection Inject 0.3 mg into the muscle as needed (allergic reaction).     [provider]  hydroxychloroquine (PLAQUENIL) 200 MG tablet TAKE 1 TABLET(200 MG) BY MOUTH TWICE DAILY 02/19/18   [name], Shaili,  MD  levonorgestrel (MIRENA) 20 MCG/24HR IUD 1 each by Intrauterine route once. Implanted Fall 2013    [provider]  Multiple Vitamins-Minerals (MULTIVITAMIN PO) Take by mouth.    [provider]  naproxen sodium (ALEVE) 220 MG tablet Take 440 mg by mouth daily.    [provider]  Omega-3 Fatty Acids (FISH OIL PO) Take 2 capsules by mouth daily.    [provider]  ondansetron (ZOFRAN) 4 MG tablet Take 1 tablet (4 mg total) by mouth every 6 (six) hours. 02/17/18   Couture, Cortni S, PA-C  PROBIOTIC PRODUCT PO Take 1 tablet by mouth daily.     [provider]  valACYclovir (VALTREX) 500 MG tablet TAKE 1 TABLET BY MOUTH ONCE DAILY. INCREASE TO TWO  TIMES DAILY FOR 3 DAYS ONLY AT ONSET OF  OUTBREAK Patient taking differently: Take 500 mg by mouth daily. TAKE 1 TABLET BY MOUTH ONCE DAILY. INCREASE TO TWO  TIMES DAILY FOR 3 DAYS ONLY AT ONSET OF OUTBREAK 02/02/18   Verner Chol, CNM  Vitamin D, Ergocalciferol, (DRISDOL) 1.25 MG (50000 UT) CAPS capsule Take 1 capsule (50,000 Units total) by mouth every 7 (seven) days. 01/13/18   Pollyann Savoy, MD  Vitamin D, Ergocalciferol, (DRISDOL) 50000 units CAPS capsule Take 1 capsule (50,000 Units total) by mouth every 7 (seven) days. Patient not taking: Reported on 02/17/2018 08/12/17   Pollyann Savoy, MD    Family History Family History  Problem Relation Age of Onset  . Hypertension Mother   . Hypertension Father   . Depression Father   . Prostate cancer Father   . Thyroid disease Sister     Social History Social History   Tobacco Use  . Smoking status: Never Smoker  . Smokeless tobacco: Never Used  Substance Use Topics  . Alcohol use: Yes    Alcohol/week: 3.0 standard drinks    Types: 3 Standard drinks or equivalent per week  . Drug use: No     Allergies   Other   Review of Systems Review of Systems  Constitutional: Positive for fever.  HENT: Positive for congestion and facial swelling.   Respiratory: Positive for cough, chest tightness and shortness of breath.   Cardiovascular: Negative.   Gastrointestinal: Negative.  Negative for vomiting.  Musculoskeletal: Negative.  Negative for myalgias.  Skin: Positive for rash.  Neurological: Negative.  Negative for weakness and light-headedness.     Physical Exam Updated Vital Signs BP (!) 130/99 (BP Location: Left Arm)   Pulse 72   Temp 98 F (36.7 C) (Oral)   Resp 18   LMP 02/13/2018   SpO2 98%   Physical Exam Vitals signs and nursing note reviewed.  Constitutional:      General: She is not in acute distress.    Appearance: Normal appearance.  HENT:     Head: Normocephalic.     Nose: Congestion present.     Mouth/Throat:     Mouth: Mucous  membranes are moist.     Pharynx: Oropharynx is clear. No oropharyngeal exudate.     Comments: No tongue or lip swelling noted.  Eyes:     Conjunctiva/sclera: Conjunctivae normal.  Neck:     Musculoskeletal: Normal range of motion and neck supple.  Cardiovascular:     Rate and Rhythm: Normal rate and regular rhythm.     Heart sounds: No murmur.  Pulmonary:     Effort: Pulmonary effort is normal.     Breath sounds: No wheezing, rhonchi or  rales.     Comments: Actively coughing with deep breath. Abdominal:     Tenderness: There is no abdominal tenderness.  Skin:    Comments: Raised, red rash to posterior UE's. Minimal similar rash to inner thighs bilaterally.   Neurological:     Mental Status: She is oriented to person, place, and time.      ED Treatments / Results  Labs (all labs ordered are listed, but only abnormal results are displayed) Labs Reviewed  BASIC METABOLIC PANEL - Abnormal; Notable for the following components:      Result Value   Glucose, Bld 102 (*)    All other components within normal limits  CBC    EKG EKG Interpretation  Date/Time:  Thursday March 04 2018 19:33:23 EST Ventricular Rate:  73 PR Interval:  166 QRS Duration: 84 QT Interval:  374 QTC Calculation: 412 R Axis:   81 Text Interpretation:  Normal sinus rhythm Normal ECG No old tracing to compare Confirmed by Dione Booze ([id number]) on 03/05/2018 2:52:05 AM   Radiology Dg Chest 2 View  Result Date: 03/04/2018 CLINICAL DATA:  Shortness of breath EXAM: CHEST - 2 VIEW COMPARISON:  None. FINDINGS: Normal cardiac and mediastinal contours. No consolidative pulmonary opacities. No pleural effusion or pneumothorax. Regional skeleton is unremarkable. IMPRESSION: No active cardiopulmonary disease. Electronically Signed   By: Annia Belt M.D.   On: 03/04/2018 20:19    Procedures Procedures (including critical care time)  Medications Ordered in ED Medications  diphenhydrAMINE (BENADRYL) capsule 25  mg (25 mg Oral Given 03/04/18 2148)     Initial Impression / Assessment and Plan / ED Course  I have reviewed the triage vital signs and the nursing notes.  Pertinent labs & imaging results that were available during my care of the patient were reviewed by me and considered in my medical decision making (see chart for details).     Patient to ED with concern for allergic reaction with generalized itching rash and shortness of breath. No facial or intraoral swelling, difficulty swallowing or perceived wheezing/stridor.   ON exam, there is full air movement, no facial or oropharyngeal swelling. She is given benadryl and pepcid and sees improvement. She still has coughing from URI that was present when prescribed abx and prednisone. Nebulizer treatment ordered and improves her cough. She denies persistent or worsening SOB. Second nebulizer provided prior to discharge.   Feel the likely culprit of allergic reaction is penicillin. She is instructed to complete the prednisone regimen she is on. She will list PCN as an allergy on her medical records from now on.   Will discharge home on Benadryl and Pepcid. She is given an EpiPen in the event she experiences worse symptoms allergy and instructed to return to the ED immediately if she felt it necessary to use it.   Final Clinical Impressions(s) / ED Diagnoses   Final diagnoses:  None   1. Allergic reaction, penicillin 2. URI  ED Discharge Orders    None       Elpidio Anis, PA-C 03/07/18 0431    Dione Booze, MD 03/07/18 [phone]

## 2018-04-09 ENCOUNTER — Telehealth: Payer: Self-pay | Admitting: Rheumatology

## 2018-04-09 NOTE — Telephone Encounter (Signed)
Patient left a message 12:54pm requesting a call back to verify a Plaquenil Eye exam form has been sent to  [facility]. She had appt there today, but did not have a form to give them to be filled out. Please call patient to advise. Fax# [phone]

## 2018-04-09 NOTE — Telephone Encounter (Signed)
Patient advised PLQ eye  form has been faxed to [facility].

## 2018-05-07 ENCOUNTER — Other Ambulatory Visit: Payer: Self-pay | Admitting: Pharmacist

## 2018-05-07 DIAGNOSIS — M3219 Other organ or system involvement in systemic lupus erythematosus: Secondary | ICD-10-CM

## 2018-05-07 MED ORDER — HYDROXYCHLOROQUINE SULFATE 200 MG PO TABS: ORAL_TABLET | ORAL | 0 refills | Status: DC

## 2018-05-07 NOTE — Progress Notes (Signed)
Informed patient about Plaquenil shortage and to pick up the prescription we sent to the pharmacy.

## 2018-06-08 NOTE — Progress Notes (Signed)
Virtual Visit via Video Note  I connected with [place] on 06/08/18 at  9:45 AM EDT by a video enabled telemedicine application and verified that I am speaking with the correct person using two identifiers.   I discussed the limitations of evaluation and management by telemedicine and the availability of in person appointments. The patient expressed understanding and agreed to proceed. This service was conducted via virtual visit.  Both audio and visual tools were used.  The patient was located at home. I was located in my office.  Consent was obtained prior to the virtual visit and is aware of possible charges through their insurance for this visit.  The patient is an established patient.  Dr. Corliss Skains, MD conducted the virtual visit and Sherron Ales, PA-C acted as scribe during the service.  Office staff helped with scheduling follow up visits after the service was conducted.   CC: Fatigue   History of Present Illness: Patient is a 33 year old female with a past medical history of systemic lupus erythematosus.  She is taking PLQ 200 mg BID.  She has not missed any doses.  She has been eating unhealthy lately, which she feels is contributing to increased joint pain and joint stiffness.  She has left knee joint pain but no joint swelling.  No other joints are swelling. She has been have worsening fatigue related to insomnia recently.  She is trying to get on a better sleep schedule. She denies any sicca symptoms or sores in mouth or nose.  She has intermittent palpitations. She experiences symptoms of Raynaud's intermittently in her fingertips.  She currently has a patch of eczema on her right thigh.  She denies any other rashes. She denies any swollen lymph nodes.  She states she was diagnosed with bronchitis in November that lasted until January.  She was evaluated at urgent care and the ED due to shortness of breath.  She has no shortness of breath or cough recently.   Review of Systems   Constitutional: Positive for malaise/fatigue. Negative for fever.  Eyes: Negative for photophobia, pain, discharge and redness.  Respiratory: Negative for cough, shortness of breath and wheezing.   Cardiovascular: Negative for chest pain and palpitations.  Gastrointestinal: Negative for blood in stool, constipation and diarrhea.  Genitourinary: Negative for dysuria.  Musculoskeletal: Negative for back pain, joint pain, myalgias and neck pain.       +Joint stiffness   Skin: Negative for rash.  Neurological: Negative for dizziness and headaches.  Psychiatric/Behavioral: Negative for depression. The patient has insomnia. The patient is not nervous/anxious.      Observations/Objective: Physical Exam  Constitutional: She is oriented to person, place, and time and well-developed, well-nourished, and in no distress.  HENT:  Head: Normocephalic and atraumatic.  Eyes: Conjunctivae are normal.  Pulmonary/Chest: Effort normal.  Neurological: She is alert and oriented to person, place, and time.  Psychiatric: Mood, memory, affect and judgment normal.   Patient reports morning stiffness for 0 minutes.   Patient denies nocturnal pain.  Difficulty dressing/grooming: Denies Difficulty climbing stairs: Report Difficulty getting out of chair: Denies Difficulty using hands for taps, buttons, cutlery, and/or writing: Denies  Assessment and Plan: Other systemic lupus erythematosus with other organ involvement (HCC) - ANA >1:1280, + Smith, + RNP, + SSA: She has not had any signs or symptoms of a flare.  She is clinically doing well on Plaquenil 200 mg 1 tablet by mouth twice daily.  Complements, sed rate, dsDNA, UA, CMP WNL on  01/12/18.   She has not had any recent lupus rashes or photosensitivity.  She was encouraged to wear sunscreen daily.  She has no oral or nasal ulcerations currently.  She has not experienced any sicca symptoms.  She has intermittent symptoms of Raynaud's but no digital ulcerations  or signs of gangrene.  She has intermittent arthralgias and joint stiffness but no joint swelling.  She experiences occasional palpitations that she attributes to anxiety. She has not had any shortness of breath recently.    Future orders for autoimmune labs will be placed today.  She was advised to notify us if she develops signs or symptoms of a flare.  She will continue on the current treatment regimen.  She will follow up in 3 months.   High risk medication use - PLQ 200 mg 1 tablet po BID. PLQ [facility] Ex[facility].  BMP and CBC were drawn on 03/04/18.  She is due to update lab work in June and every 5 months.  Future orders for autoimmune labs will be placed today.  Social distancing practices and standard precautions recommended by CDC were discussed.   Raynaud's syndrome without gangrene: She has intermittent symptoms of Raynaud's in her fingertips.  She has no digital ulcerations or signs of gangrene.  She was encouraged to keep her core body temperature warm and wear gloves.   Vitamin D deficiency -Vitamin D low at 21 on 01/12/18.  She was prescribed vitamin D 50,000 units by mouth once weekly.  Future order for vitamin D is in place.  Palpitations: She has intermittent palpitations at night.   Other fatigue: She has chronic fatigue that has been worsening related to insomnia.   Follow Up Instructions: She will follow up in 3 months.  Future orders for autoimmune labs will be placed today.    I discussed the assessment and treatment plan with the patient. The patient was provided an opportunity to ask questions and all were answered. The patient agreed with the plan and demonstrated an understanding of the instructions.   The patient was advised to call back or seek an in-person evaluation if the symptoms worsen or if the condition fails to improve as anticipated.  I provided 25 minutes of non-face-to-face time during this encounter. Pollyann SavoyShaili  Deveshwar, MD   Scribed by-  Gearldine Bienenstockaylor M Nahla Lukin, PA-C

## 2018-06-15 ENCOUNTER — Telehealth (INDEPENDENT_AMBULATORY_CARE_PROVIDER_SITE_OTHER): Payer: 59 | Admitting: Rheumatology

## 2018-06-15 ENCOUNTER — Encounter: Payer: Self-pay | Admitting: Rheumatology

## 2018-06-15 ENCOUNTER — Other Ambulatory Visit: Payer: Self-pay

## 2018-06-15 DIAGNOSIS — G43001 Migraine without aura, not intractable, with status migrainosus: Secondary | ICD-10-CM

## 2018-06-15 DIAGNOSIS — E669 Obesity, unspecified: Secondary | ICD-10-CM

## 2018-06-15 DIAGNOSIS — E559 Vitamin D deficiency, unspecified: Secondary | ICD-10-CM

## 2018-06-15 DIAGNOSIS — M3219 Other organ or system involvement in systemic lupus erythematosus: Secondary | ICD-10-CM | POA: Diagnosis not present

## 2018-06-15 DIAGNOSIS — Z79899 Other long term (current) drug therapy: Secondary | ICD-10-CM

## 2018-06-15 DIAGNOSIS — I7300 Raynaud's syndrome without gangrene: Secondary | ICD-10-CM

## 2018-06-15 DIAGNOSIS — R5383 Other fatigue: Secondary | ICD-10-CM

## 2018-06-15 DIAGNOSIS — Z6835 Body mass index (BMI) 35.0-35.9, adult: Secondary | ICD-10-CM

## 2018-06-15 DIAGNOSIS — R002 Palpitations: Secondary | ICD-10-CM

## 2018-06-15 DIAGNOSIS — E785 Hyperlipidemia, unspecified: Secondary | ICD-10-CM

## 2018-06-16 ENCOUNTER — Telehealth: Payer: Self-pay | Admitting: Rheumatology

## 2018-06-16 DIAGNOSIS — M3219 Other organ or system involvement in systemic lupus erythematosus: Secondary | ICD-10-CM

## 2018-06-16 DIAGNOSIS — E559 Vitamin D deficiency, unspecified: Secondary | ICD-10-CM

## 2018-06-16 DIAGNOSIS — Z79899 Other long term (current) drug therapy: Secondary | ICD-10-CM

## 2018-06-16 NOTE — Telephone Encounter (Signed)
Patient going to [facility] on [address] this coming week for labs. Please release orders. °

## 2018-06-16 NOTE — Telephone Encounter (Signed)
Order's released

## 2018-06-28 LAB — URINALYSIS, ROUTINE W REFLEX MICROSCOPIC
Bilirubin Urine: NEGATIVE
Glucose, UA: NEGATIVE
Hgb urine dipstick: NEGATIVE
Leukocytes,Ua: NEGATIVE
Nitrite: NEGATIVE
Protein, ur: NEGATIVE
Specific Gravity, Urine: 1.019 (ref 1.001–1.03)
pH: <=5 (ref 5.0–8.0)

## 2018-06-28 LAB — COMPLETE METABOLIC PANEL WITH GFR
AG Ratio: 1.2 (calc) (ref 1.0–2.5)
ALT: 16 U/L (ref 6–29)
AST: 20 U/L (ref 10–30)
Albumin: 4.3 g/dL (ref 3.6–5.1)
Alkaline phosphatase (APISO): 61 U/L (ref 31–125)
BUN: 14 mg/dL (ref 7–25)
CO2: 24 mmol/L (ref 20–32)
Calcium: 9.8 mg/dL (ref 8.6–10.2)
Chloride: 102 mmol/L (ref 98–110)
Creat: 0.98 mg/dL (ref 0.50–1.10)
GFR, Est African American: 89 mL/min/{1.73_m2} (ref >=60)
GFR, Est Non African American: 77 mL/min/{1.73_m2} (ref >=60)
Globulin: 3.6 g/dL (calc) (ref 1.9–3.7)
Glucose, Bld: 82 mg/dL (ref 65–99)
Potassium: 4.2 mmol/L (ref 3.5–5.3)
Sodium: 136 mmol/L (ref 135–146)
Total Bilirubin: 0.4 mg/dL (ref 0.2–1.2)
Total Protein: 7.9 g/dL (ref 6.1–8.1)

## 2018-06-28 LAB — CBC WITH DIFFERENTIAL/PLATELET
Absolute Monocytes: 368 cells/uL (ref 200–950)
Basophils Absolute: 18 cells/uL (ref 0–200)
Basophils Relative: 0.4 %
Eosinophils Absolute: 69 cells/uL (ref 15–500)
Eosinophils Relative: 1.5 %
HCT: 39 % (ref 35.0–45.0)
Hemoglobin: 13.8 g/dL (ref 11.7–15.5)
Lymphs Abs: 1279 cells/uL (ref 850–3900)
MCH: 34.4 pg — ABNORMAL HIGH (ref 27.0–33.0)
MCHC: 35.4 g/dL (ref 32.0–36.0)
MCV: 97.3 fL (ref 80.0–100.0)
MPV: 12.6 fL — ABNORMAL HIGH (ref 7.5–12.5)
Monocytes Relative: 8 %
Neutro Abs: 2866 cells/uL (ref 1500–7800)
Neutrophils Relative %: 62.3 %
Platelets: 171 10*3/uL (ref 140–400)
RBC: 4.01 10*6/uL (ref 3.80–5.10)
RDW: 11.9 % (ref 11.0–15.0)
Total Lymphocyte: 27.8 %
WBC: 4.6 10*3/uL (ref 3.8–10.8)

## 2018-06-28 LAB — C3 AND C4
C3 Complement: 123 mg/dL (ref 83–193)
C4 Complement: 18 mg/dL (ref 15–57)

## 2018-06-28 LAB — SEDIMENTATION RATE: Sed Rate: 28 mm/h — ABNORMAL HIGH (ref 0–20)

## 2018-06-28 LAB — VITAMIN D 25 HYDROXY (VIT D DEFICIENCY, FRACTURES): Vit D, 25-Hydroxy: 47 ng/mL (ref 30–100)

## 2018-06-28 LAB — ANTI-DNA ANTIBODY, DOUBLE-STRANDED: ds DNA Ab: 3 IU/mL

## 2018-06-28 NOTE — Telephone Encounter (Signed)
Vitamin D is normal now.  Please advise patient to take vitamin D 2000 units every day.

## 2018-06-29 NOTE — Telephone Encounter (Signed)
Complements WNL and dsDNA is 3.  CMP WNL.  CBC stable. UA revealed trace ketones.  Sed rate is mildly elevated.  She was clinically doing well at her virtual visit on 06/15/18. Please advise patient to notify us if she develops any signs or symptoms of a flare.

## 2018-08-31 ENCOUNTER — Other Ambulatory Visit: Payer: Self-pay | Admitting: Rheumatology

## 2018-08-31 DIAGNOSIS — M3219 Other organ or system involvement in systemic lupus erythematosus: Secondary | ICD-10-CM

## 2018-08-31 NOTE — Telephone Encounter (Signed)
Last Visit: 06/15/18 Next Visit: 09/15/18 Labs: 06/25/18 CMP WNL. CBC stable.  Eye exam: 04/09/18 WNL   Okay to refill per Dr Estanislado Pandy

## 2018-09-01 NOTE — Progress Notes (Deleted)
Office Visit Note  Patient: Tammy Giles             Date of Birth: 1985/08/29           MRN: [id number]             PCP: Bo Merino, MD Referring: Bo Merino, MD Visit Date: 09/15/2018 Occupation: @GUAROCC @  Subjective:  No chief complaint on file.   History of Present Illness: Tammy Giles is a 33 y.o. female ***   Activities of Daily Living:  Patient reports morning stiffness for *** {minute/hour:19697}.   Patient {ACTIONS;DENIES/REPORTS:21021675::"Denies"} nocturnal pain.  Difficulty dressing/grooming: {ACTIONS;DENIES/REPORTS:21021675::"Denies"} Difficulty climbing stairs: {ACTIONS;DENIES/REPORTS:21021675::"Denies"} Difficulty getting out of chair: {ACTIONS;DENIES/REPORTS:21021675::"Denies"} Difficulty using hands for taps, buttons, cutlery, and/or writing: {ACTIONS;DENIES/REPORTS:21021675::"Denies"}  No Rheumatology ROS completed.   PMFS History:  Patient Active Problem List   Diagnosis Date Noted  . Class 2 obesity without serious comorbidity with body mass index (BMI) of 35.0 to 35.9 in adult 02/13/2017  . Raynaud's syndrome without gangrene 02/13/2017  . High risk medication use 02/13/2017  . Dyslipidemia 12/12/2015  . Vitamin D deficiency 12/12/2015  . Annual physical exam 12/11/2015  . Migraine without aura and with status migrainosus, not intractable 12/11/2015  . Obesity (BMI 35.0-39.9 without comorbidity) 12/11/2015  . Encounter for long-term (current) use of high-risk medication 04/26/2014  . Abnormal Pap smear of cervix 06/16/2013    Class: History of  . Lupus (systemic lupus erythematosus) ([place]) 10/22/2011    Past Medical History:  Diagnosis Date  . Abnormal Pap smear of cervix    LGSIL 2014 & 2015, ASCUS HPV HR+ 2016, ASCUS HPV HR+ 2017, 12-26-16 LGSIL  . 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 Irregular periods    has IUD  . Lupus ([place])   . Migraines   . STD (sexually transmitted disease)    HSV2    Family History  Problem  Relation Age of Onset  . Hypertension Mother   . Hypertension Father   . Depression Father   . Prostate cancer Father   . Thyroid disease Sister    Past Surgical History:  Procedure Laterality Date  . ACHILLES TENDON SURGERY Left 05/24/2014   Procedure: LEFT ACHILLES TENDON REPAIR;  Surgeon: Leandrew Koyanagi, MD;  Location: [place];  Service: Orthopedics;  Laterality: Left;  . COLPOSCOPY     2014, 2015, 2016, 2017 LGSIL, 2018  . INTRAUTERINE DEVICE (IUD) INSERTION     insertion 07-18-16  . WISDOM TOOTH EXTRACTION     Social History   Social History Narrative  . Not on file   Immunization History  Administered Date(s) Administered  . Influenza Split 11/20/2014  . Tdap 02/18/2007, 12/26/2016     Objective: Vital Signs: There were no vitals taken for this visit.   Physical Exam   Musculoskeletal Exam: ***  CDAI Exam: CDAI Score: - Patient Global: -; Provider Global: - Swollen: -; Tender: - Joint Exam   No joint exam has been documented for this visit   There is currently no information documented on the homunculus. Go to the Rheumatology activity and complete the homunculus joint exam.  Investigation: No additional findings.  Imaging: No results found.  Recent Labs: Lab Results  Component Value Date   WBC 4.6 06/25/2018   HGB 13.8 06/25/2018   PLT 171 06/25/2018   NA 136 06/25/2018   K 4.2 06/25/2018   CL 102 06/25/2018   CO2 24 06/25/2018   GLUCOSE 82 06/25/2018   BUN 14 06/25/2018  CREATININE 0.98 06/25/2018   BILITOT 0.4 06/25/2018   AST 20 06/25/2018   ALT 16 06/25/2018   PROT 7.9 06/25/2018   CALCIUM 9.8 06/25/2018   GFRAA 89 06/25/2018    Speciality Comments: PLQ Eye Exam: 04/09/18 WNL @ [facility] Follow up in 1 year  Procedures:  No procedures performed Allergies: Other   Assessment / Plan:     Visit Diagnoses: No diagnosis found.  Orders: No orders of the defined types were placed in this encounter.  No orders of  the defined types were placed in this encounter.   Face-to-face time spent with patient was *** minutes. Greater than 50% of time was spent in counseling and coordination of care.  Follow-Up Instructions: No follow-ups on file.   Gearldine Bienenstockaylor M Dale, PA-C  Note - This record has been created using Dragon software.  Chart creation errors have been sought, but may not always  have been located. Such creation errors do not reflect on  the standard of medical care.

## 2018-09-15 ENCOUNTER — Ambulatory Visit: Payer: 59 | Admitting: Physician Assistant

## 2018-12-10 ENCOUNTER — Other Ambulatory Visit: Payer: Self-pay | Admitting: Rheumatology

## 2018-12-10 DIAGNOSIS — M3219 Other organ or system involvement in systemic lupus erythematosus: Secondary | ICD-10-CM

## 2018-12-10 NOTE — Telephone Encounter (Signed)
Please schedule patient for a follow up visit. Patient was due July 2020. Thanks!  

## 2018-12-10 NOTE — Telephone Encounter (Signed)
Last Visit: 06/15/18 Next Visit:was due July 2020. Message sent to the front to schedule.  Labs: 06/25/18 CMP WNL. CBC stable.  Eye exam: 04/09/18 WNL   Okay to refill per Dr Estanislado Pandy

## 2018-12-13 NOTE — Progress Notes (Signed)
Office Visit Note  Patient: Tammy Giles             Date of Birth: 25-May-1985           MRN: [id number]             PCP: Pollyann Savoy, MD Referring: Pollyann Savoy, MD Visit Date: 12/20/2018 Occupation: @  Subjective:  Pain in both wrist joints    History of Present Illness: Tammy Giles is a 33 y.o. female with history of systemic lupus erythematosus and Raynaud's syndrome.  Patient is taking Plaquenil 200 mg 1 tablet by mouth twice daily.  She states that about 3 weeks ago she went to [facility] on vacation and states during that time she was having mild symptoms of a possible lupus flare.  She states that she was experiencing increased fatigue, rashes intermittently, GI upset, and increased joint pain.  She states she notices increased joint inflammation and stiffness when she eats gluten.  She has tried to start a limited gluten from her diet.  She is having some discomfort in bilateral wrist joints and the left fourth PIP joint.  She denies any other joint pain or joint swelling at this time.  She has no rashes currently.  She experiences intermittent symptoms of Raynaud's but no ulcerations.  She has 1 nasal ulceration but no oral ulcerations.  She denies any sicca symptoms.  She denies any recent fevers or swollen lymph nodes.   Activities of Daily Living:  Patient reports morning stiffness for 0 none.   Patient Reports nocturnal pain.  Difficulty dressing/grooming: Denies Difficulty climbing stairs: Denies Difficulty getting out of chair: Denies Difficulty using hands for taps, buttons, cutlery, and/or writing: Reports  Review of Systems  Constitutional: Positive for fatigue.  HENT: Negative for mouth sores, mouth dryness and nose dryness.   Eyes: Negative for pain, visual disturbance and dryness.  Respiratory: Negative for cough, hemoptysis and difficulty breathing.   Cardiovascular: Negative for chest pain, palpitations, hypertension and swelling in  legs/feet.  Gastrointestinal: Positive for constipation and diarrhea. Negative for blood in stool.  Endocrine: Negative for cold intolerance and increased urination.  Genitourinary: Negative for difficulty urinating and painful urination.  Musculoskeletal: Positive for arthralgias, joint pain and joint swelling. Negative for myalgias, muscle weakness, morning stiffness, muscle tenderness and myalgias.  Skin: Positive for rash. Negative for color change, pallor, hair loss, nodules/bumps, skin tightness, ulcers and sensitivity to sunlight.  Neurological: Positive for headaches. Negative for dizziness, numbness and weakness.  Hematological: Negative for bruising/bleeding tendency and swollen glands.  Psychiatric/Behavioral: Positive for sleep disturbance. Negative for depressed mood. The patient is not nervous/anxious.     PMFS History:  Patient Active Problem List   Diagnosis Date Noted   Class 2 obesity without serious comorbidity with body mass index (BMI) of 35.0 to 35.9 in adult 02/13/2017   Raynaud's syndrome without gangrene 02/13/2017   High risk medication use 02/13/2017   Dyslipidemia 12/12/2015   Vitamin D deficiency 12/12/2015   Annual physical exam 12/11/2015   Migraine without aura and with status migrainosus, not intractable 12/11/2015   Obesity (BMI 35.0-39.9 without comorbidity) 12/11/2015   Encounter for long-term (current) use of high-risk medication 04/26/2014   Abnormal Pap smear of cervix 06/16/2013    Class: History of   Lupus (systemic lupus erythematosus) (HCC) 10/22/2011    Past Medical History:  Diagnosis Date   Abnormal Pap smear of cervix    LGSIL 2014 & 2015, ASCUS HPV HR+ 2016, ASCUS HPV  HR+ 2017, 12-26-16 LGSIL   Achilles rupture, left 05/13/2014   Irregular periods    has IUD   Lupus (HCC)    Migraines    STD (sexually transmitted disease)    HSV2    Family History  Problem Relation Age of Onset   Hypertension Mother     Hypertension Father    Depression Father    Prostate cancer Father    Thyroid disease Sister    Past Surgical History:  Procedure Laterality Date   ACHILLES TENDON SURGERY Left 05/24/2014   Procedure: LEFT ACHILLES TENDON REPAIR;  Surgeon: Tarry Kos, MD;  Location: [facility];  Service: Orthopedics;  Laterality: Left;   COLPOSCOPY     2014, 2015, 2016, 2017 LGSIL, 2018   INTRAUTERINE DEVICE (IUD) INSERTION     insertion 07-18-16   WISDOM TOOTH EXTRACTION     Social History   Social History Narrative   Not on file   Immunization History  Administered Date(s) Administered   Influenza Split 11/20/2014   Tdap 02/18/2007, 12/26/2016     Objective: Vital Signs: BP 113/80 (BP Location: Left Arm, Patient Position: Sitting, Cuff Size: Normal)    Pulse 70    Resp 16    Ht 5\' 5"  (1.651 m)    Wt 248 lb 1.3 oz (112.5 kg)    BMI 41.28 kg/m    Physical Exam Vitals signs and nursing note reviewed.  Constitutional:      Appearance: She is well-developed.  HENT:     Head: Normocephalic and atraumatic.  Eyes:     Conjunctiva/sclera: Conjunctivae normal.  Neck:     Musculoskeletal: Normal range of motion.  Cardiovascular:     Rate and Rhythm: Normal rate and regular rhythm.     Heart sounds: Normal heart sounds.  Pulmonary:     Effort: Pulmonary effort is normal.     Breath sounds: Normal breath sounds.  Abdominal:     General: Bowel sounds are normal.     Palpations: Abdomen is soft.  Lymphadenopathy:     Cervical: No cervical adenopathy.  Skin:    General: Skin is warm and dry.     Capillary Refill: Capillary refill takes less than 2 seconds.  Neurological:     Mental Status: She is alert and oriented to person, place, and time.  Psychiatric:        Behavior: Behavior normal.      Musculoskeletal Exam: C-spine, thoracic spine, and lumbar spine good ROM.  No midline spinal tenderness.  No SI joint tenderness.  Shoulder joints, elbow joints, wrist  joints, MCPs, PIPs, and DIPs good ROM with no synovitis.  Hip joints, knee joints, ankle joints, MTPs, PIPs, and DIPs good ROM with no synovitis.  No warmth or effusion of knee joints.  No tenderness or swelling of ankle joints.  No tenderness over trochanteric bursa bilaterally.   CDAI Exam: CDAI Score: -- Patient Global: --; Provider Global: -- Swollen: --; Tender: -- Joint Exam   No joint exam has been documented for this visit   There is currently no information documented on the homunculus. Go to the Rheumatology activity and complete the homunculus joint exam.  Investigation: No additional findings.  Imaging: No results found.  Recent Labs: Lab Results  Component Value Date   WBC 4.6 06/25/2018   HGB 13.8 06/25/2018   PLT 171 06/25/2018   NA 136 06/25/2018   K 4.2 06/25/2018   CL 102 06/25/2018   CO2 24 06/25/2018  GLUCOSE 82 06/25/2018   BUN 14 06/25/2018   CREATININE 0.98 06/25/2018   BILITOT 0.4 06/25/2018   AST 20 06/25/2018   ALT 16 06/25/2018   PROT 7.9 06/25/2018   CALCIUM 9.8 06/25/2018   GFRAA 89 06/25/2018    Speciality Comments: PLQ Eye Exam: 04/09/18 WNL @ [facility] Follow up in 1 year  Procedures:  No procedures performed Allergies: Other   Assessment / Plan:     Visit Diagnoses: Other systemic lupus erythematosus with other organ involvement (HCC) -  ANA >1:1280, + Smith, + RNP, + SSA: She presents today and is clinically doing well on Plaquenil 200 mg 1 tablet by mouth twice daily.  About 3 weeks ago she traveled to [facility] and states that she was eating poorly and not getting good rest.  She states that during that time she was experiencing increased arthralgias, intermittent rashes, and fatigue.  She has started to eliminate gluten from her diet and is starting to feel better.  She is having some discomfort in bilateral wrist transpose no tenderness or synovitis on exam today.  No Maller rash was noted.  No hair loss recently.  She has  intermittent symptoms of Raynaud's but no digital ulcerations or signs of gangrene were noted.  She has one nasal ulceration but no oral ulcerations.  She has not had any sicca symptoms recently.  She is not experiencing any shortness of breath, chest pain, or palpitations.  She continues to have chronic fatigue but has not had any fevers or enlarged lymph nodes.  She will continue taking Plaquenil as prescribed.  Discussed the importance of staying compliant with taking Plaquenil as well as wearing sunscreen and sun protective clothing to prevent flares.  We also discussed the importance of eating a well-balanced diet and getting good rest at night.  We will check her autoimmune lab work today.  She was advised to notify us if she develops any new or worsening symptoms.  She will follow-up in the office in 3 months- Plan: CBC with Differential/Platelet, COMPLETE METABOLIC PANEL WITH GFR, Ambulatory referral to Gastroenterology, Urinalysis, Routine w reflex microscopic, Anti-DNA antibody, double-stranded, C3 and C4, Sedimentation rate  High risk medication use - Plaquenil 200 mg 1 tablet twice daily.  Last Plaquenil eye exam normal on 04/09/2018.  Most recent CBC/CMP within normal limits on 06/25/2018.  Due for CBC/CMP today and will monitor every 5 months.   - Plan: CBC with Differential/Platelet, COMPLETE METABOLIC PANEL WITH GFR  Raynaud's syndrome without gangrene: She has intermittent symptoms of Raynaud's.  No digital ulcerations or signs of gangrene were noted.  We discussed the importance of keeping her core body temperature warm, wearing gloves, and wearing socks.  We also discussed the importance of drinking warm fluids and avoiding triggers.  Palpitations: She has not had any recent palpations.    Other fatigue: Stable   Vitamin D deficiency: Vitamin D was 47 on 06/25/18.    History of gastroesophageal reflux (GERD) - She has been having intermittent symptoms of reflux.  She feels that her symptoms  are worse after eating gluten and tomato based products.  She has been taking Alka-Seltzer plus on a daily basis.  Referral to GI will be placed today.  Plan: Ambulatory referral to Gastroenterology  Diarrhea in adult patient -she is experiencing intermittent constipation and diarrhea.  She has also had episodes of vomiting and does not feel as though she is digesting her food fully.  Referral to gastroenterology will be  placed today for further evaluation. Plan: Ambulatory referral to Gastroenterology  Other medical conditions are listed as follows:   Dyslipidemia  Migraine    Orders: Orders Placed This Encounter  Procedures   CBC with Differential/Platelet   COMPLETE METABOLIC PANEL WITH GFR   Urinalysis, Routine w reflex microscopic   Anti-DNA antibody, double-stranded   C3 and C4   Sedimentation rate   Ambulatory referral to Gastroenterology   No orders of the defined types were placed in this encounter.   Face-to-face time spent with patient was 30 minutes. Greater than 50% of time was spent in counseling and coordination of care.  Follow-Up Instructions: Return in about 3 months (around 03/22/2019) for Raynaud's syndrome, Systemic lupus erythematosus.   Gearldine Bienenstockaylor M Berenize Gatlin, PA-C  Note - This record has been created using Dragon software.  Chart creation errors have been sought, but may not always  have been located. Such creation errors do not reflect on  the standard of medical care.

## 2018-12-20 ENCOUNTER — Other Ambulatory Visit: Payer: Self-pay

## 2018-12-20 ENCOUNTER — Ambulatory Visit: Payer: 59 | Admitting: Physician Assistant

## 2018-12-20 ENCOUNTER — Encounter: Payer: Self-pay | Admitting: Physician Assistant

## 2018-12-20 VITALS — BP 113/80 | HR 70 | Resp 16 | Ht 65.0 in | Wt 248.1 lb

## 2018-12-20 DIAGNOSIS — Z8719 Personal history of other diseases of the digestive system: Secondary | ICD-10-CM

## 2018-12-20 DIAGNOSIS — R5383 Other fatigue: Secondary | ICD-10-CM

## 2018-12-20 DIAGNOSIS — R197 Diarrhea, unspecified: Secondary | ICD-10-CM

## 2018-12-20 DIAGNOSIS — E785 Hyperlipidemia, unspecified: Secondary | ICD-10-CM

## 2018-12-20 DIAGNOSIS — E559 Vitamin D deficiency, unspecified: Secondary | ICD-10-CM

## 2018-12-20 DIAGNOSIS — Z79899 Other long term (current) drug therapy: Secondary | ICD-10-CM

## 2018-12-20 DIAGNOSIS — R002 Palpitations: Secondary | ICD-10-CM | POA: Diagnosis not present

## 2018-12-20 DIAGNOSIS — M3219 Other organ or system involvement in systemic lupus erythematosus: Secondary | ICD-10-CM | POA: Diagnosis not present

## 2018-12-20 DIAGNOSIS — I7300 Raynaud's syndrome without gangrene: Secondary | ICD-10-CM | POA: Diagnosis not present

## 2018-12-20 DIAGNOSIS — G43001 Migraine without aura, not intractable, with status migrainosus: Secondary | ICD-10-CM

## 2018-12-21 ENCOUNTER — Encounter: Payer: Self-pay | Admitting: Gastroenterology

## 2019-01-11 ENCOUNTER — Other Ambulatory Visit: Payer: Self-pay

## 2019-01-11 ENCOUNTER — Telehealth: Payer: Self-pay | Admitting: Rheumatology

## 2019-01-11 DIAGNOSIS — M3219 Other organ or system involvement in systemic lupus erythematosus: Secondary | ICD-10-CM

## 2019-01-11 DIAGNOSIS — Z20822 Contact with and (suspected) exposure to covid-19: Secondary | ICD-10-CM

## 2019-01-11 MED ORDER — HYDROXYCHLOROQUINE SULFATE 200 MG PO TABS: 200.0000 mg | ORAL_TABLET | Freq: Two times a day (BID) | ORAL | 0 refills | Status: DC

## 2019-01-11 NOTE — Telephone Encounter (Signed)
Patient called requesting prescription refill of Plaquenil to be sent to [facility] at [address].   

## 2019-01-11 NOTE — Telephone Encounter (Signed)
Last Visit: 12/20/2018 Next Visit: 03/22/2019 Labs: 06/25/2018  Eye exam: 04/09/2018  Advised patient she is due to update labs, patient is aware and will get them drawn soon.   Okay to refill 30 day supply, per Dr. Estanislado Pandy.

## 2019-01-12 LAB — NOVEL CORONAVIRUS, NAA: SARS-CoV-2, NAA: NOT DETECTED

## 2019-01-17 ENCOUNTER — Other Ambulatory Visit: Payer: Self-pay | Admitting: *Deleted

## 2019-01-17 ENCOUNTER — Telehealth: Payer: Self-pay | Admitting: Rheumatology

## 2019-01-17 ENCOUNTER — Other Ambulatory Visit: Payer: Self-pay | Admitting: Physician Assistant

## 2019-01-17 DIAGNOSIS — M3219 Other organ or system involvement in systemic lupus erythematosus: Secondary | ICD-10-CM

## 2019-01-17 NOTE — Telephone Encounter (Signed)
01/11/19 - fever, head ache, swollen lymph node - right side, fever broke 01/15/2019, negative for COVID, ER at [facility] 01/13/2019, flu like symptoms, talked to PCP today, infection/lupus? [phone]. Patient wants to know if she should have labs performed? Currently, headaches at night, no fever, some congestion, muscle and body aches, no joint pain, unexplained bruising.

## 2019-01-17 NOTE — Telephone Encounter (Signed)
We can obtain the following lab work: Complements, dsDNA, sed rate, CBC, CMP, UA prior to her upcoming appointment on 01/19/19.  Future orders will be placed today.  Ok to release orders when you find out when/where the patient will be going for lab work. We will further discuss her symptoms at the follow up visit.

## 2019-01-17 NOTE — Telephone Encounter (Signed)
Patient will have labs drawn 01/17/2019

## 2019-01-17 NOTE — Telephone Encounter (Signed)
Patient called stating she has been "experiencing unusual symptoms" and requested a return call.  Patient was told that due to virtual appointments, we would return her call on Wednesday, 01/19/19.

## 2019-01-19 ENCOUNTER — Other Ambulatory Visit: Payer: Self-pay | Admitting: *Deleted

## 2019-01-19 ENCOUNTER — Other Ambulatory Visit (HOSPITAL_COMMUNITY)
Admission: RE | Admit: 2019-01-19 | Discharge: 2019-01-19 | Disposition: A | Discharge status: Home / Self Care | Payer: 59 | Source: Ambulatory Visit | Attending: Certified Nurse Midwife | Admitting: Certified Nurse Midwife

## 2019-01-19 ENCOUNTER — Other Ambulatory Visit: Payer: Self-pay

## 2019-01-19 ENCOUNTER — Encounter: Payer: Self-pay | Admitting: Certified Nurse Midwife

## 2019-01-19 ENCOUNTER — Ambulatory Visit: Payer: 59 | Admitting: Certified Nurse Midwife

## 2019-01-19 VITALS — BP 118/68 | HR 80 | Temp 97.2°F | Resp 16 | Ht 65.0 in | Wt 239.0 lb

## 2019-01-19 DIAGNOSIS — Z124 Encounter for screening for malignant neoplasm of cervix: Secondary | ICD-10-CM

## 2019-01-19 DIAGNOSIS — Z8742 Personal history of other diseases of the female genital tract: Secondary | ICD-10-CM

## 2019-01-19 DIAGNOSIS — Z30431 Encounter for routine checking of intrauterine contraceptive device: Secondary | ICD-10-CM

## 2019-01-19 DIAGNOSIS — R899 Unspecified abnormal finding in specimens from other organs, systems and tissues: Secondary | ICD-10-CM

## 2019-01-19 DIAGNOSIS — E663 Overweight: Secondary | ICD-10-CM

## 2019-01-19 DIAGNOSIS — Z01419 Encounter for gynecological examination (general) (routine) without abnormal findings: Secondary | ICD-10-CM

## 2019-01-19 LAB — URINALYSIS, ROUTINE W REFLEX MICROSCOPIC
Bacteria, UA: NONE SEEN /HPF
Bilirubin Urine: NEGATIVE
Glucose, UA: NEGATIVE
Hgb urine dipstick: NEGATIVE
Hyaline Cast: NONE SEEN /LPF
Ketones, ur: NEGATIVE
Leukocytes,Ua: NEGATIVE
Nitrite: NEGATIVE
RBC / HPF: NONE SEEN /HPF (ref 0–2)
Specific Gravity, Urine: 1.017 (ref 1.001–1.03)
Squamous Epithelial / HPF: NONE SEEN /HPF (ref <=5)
pH: 7 (ref 5.0–8.0)

## 2019-01-19 LAB — CBC WITH DIFFERENTIAL/PLATELET
Absolute Monocytes: 593 cells/uL (ref 200–950)
Basophils Absolute: 39 cells/uL (ref 0–200)
Basophils Relative: 0.8 %
Eosinophils Absolute: 191 cells/uL (ref 15–500)
Eosinophils Relative: 3.9 %
HCT: 36.6 % (ref 35.0–45.0)
Hemoglobin: 12.5 g/dL (ref 11.7–15.5)
Lymphs Abs: 1695 cells/uL (ref 850–3900)
MCH: 32.9 pg (ref 27.0–33.0)
MCHC: 34.2 g/dL (ref 32.0–36.0)
MCV: 96.3 fL (ref 80.0–100.0)
MPV: 12.4 fL (ref 7.5–12.5)
Monocytes Relative: 12.1 %
Neutro Abs: 2381 cells/uL (ref 1500–7800)
Neutrophils Relative %: 48.6 %
Platelets: 195 10*3/uL (ref 140–400)
RBC: 3.8 10*6/uL (ref 3.80–5.10)
RDW: 12.5 % (ref 11.0–15.0)
Total Lymphocyte: 34.6 %
WBC: 4.9 10*3/uL (ref 3.8–10.8)

## 2019-01-19 LAB — COMPLETE METABOLIC PANEL WITH GFR
AG Ratio: 1 (calc) (ref 1.0–2.5)
ALT: 424 U/L — ABNORMAL HIGH (ref 6–29)
AST: 321 U/L — ABNORMAL HIGH (ref 10–30)
Albumin: 3.6 g/dL (ref 3.6–5.1)
Alkaline phosphatase (APISO): 97 U/L (ref 31–125)
BUN: 7 mg/dL (ref 7–25)
CO2: 29 mmol/L (ref 20–32)
Calcium: 9 mg/dL (ref 8.6–10.2)
Chloride: 100 mmol/L (ref 98–110)
Creat: 0.89 mg/dL (ref 0.50–1.10)
GFR, Est African American: 100 mL/min/{1.73_m2} (ref >=60)
GFR, Est Non African American: 86 mL/min/{1.73_m2} (ref >=60)
Globulin: 3.5 g/dL (calc) (ref 1.9–3.7)
Glucose, Bld: 110 mg/dL — ABNORMAL HIGH (ref 65–99)
Potassium: 4.3 mmol/L (ref 3.5–5.3)
Sodium: 136 mmol/L (ref 135–146)
Total Bilirubin: 0.6 mg/dL (ref 0.2–1.2)
Total Protein: 7.1 g/dL (ref 6.1–8.1)

## 2019-01-19 LAB — SEDIMENTATION RATE: Sed Rate: 25 mm/h — ABNORMAL HIGH (ref 0–20)

## 2019-01-19 LAB — C3 AND C4
C3 Complement: 149 mg/dL (ref 83–193)
C4 Complement: 30 mg/dL (ref 15–57)

## 2019-01-19 LAB — ANTI-DNA ANTIBODY, DOUBLE-STRANDED: ds DNA Ab: 1 IU/mL

## 2019-01-19 NOTE — Patient Instructions (Signed)

## 2019-01-19 NOTE — Progress Notes (Signed)
Sed rate is borderline elevated but stable. DsDNA is negative. Complements are WNL.  Reviewed labs with Dr. Estanislado Pandy.  Labs are not consistent with a lupus flare. CBC is WNL.   UA revealed trace protein.  Recheck UA in 2 weeks.  LFTs are elevated and have trended up significantly.  Dr. Estanislado Pandy would like to refer the patient to Dr. Benson Norway urgently.

## 2019-01-19 NOTE — Progress Notes (Signed)
33 y.o. G0P0000 Single  African American Fe here for annual exam.Contraception Mirena IUD, no warning signs noted. Has occasional spotting with IUD, no issues. No STD screening needed. Currently on Doxycyline for swollen lymph node on right. Continues with  Dr. Shan Levans for Lupus management. Felt the lymph node infection was Lupus oriented. No HSV outbreaks, does not need refill at this point. No other health issues today.  No LMP recorded. (Menstrual status: IUD).          Sexually active: Yes.    The current method of family planning is IUD.   Inserted 6/18 (MIRENA) Exercising: No.  exercise Smoker:  no  Review of Systems  Constitutional: Negative.   HENT: Negative.   Eyes: Negative.   Respiratory: Negative.   Cardiovascular: Negative.   Gastrointestinal: Negative.   Genitourinary: Negative.   Musculoskeletal: Negative.   Skin: Negative.   Neurological: Negative.   Endo/Heme/Allergies: Negative.   Psychiatric/Behavioral: Negative.     Health Maintenance: Pap:  12-26-16 LGSIL, 01-07-18 neg HPV HR neg History of Abnormal Pap: yes MMG:  none Self Breast exams: yes Colonoscopy:  none BMD:   none TDaP:  2018 Shingles: no Pneumonia: no Hep C and HIV: both neg 2018 Labs: with PCP   reports that she has never smoked. She has never used smokeless tobacco. She reports current alcohol use of about 3.0 standard drinks of alcohol per week. She reports that she does not use drugs.  Past Medical History:  Diagnosis Date  . Abnormal Pap smear of cervix    LGSIL 2014 & 2015, ASCUS HPV HR+ 2016, ASCUS HPV HR+ 2017, 12-26-16 LGSIL  . 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 Irregular periods    has IUD  . Lupus (HCC)   . Migraines   . STD (sexually transmitted disease)    HSV2    Past Surgical History:  Procedure Laterality Date  . ACHILLES TENDON SURGERY Left 05/24/2014   Procedure: LEFT ACHILLES TENDON REPAIR;  Surgeon: Tarry Kos, MD;  Location: [facility];  Service:  Orthopedics;  Laterality: Left;  . COLPOSCOPY     2014, 2015, 2016, 2017 LGSIL, 2018  . INTRAUTERINE DEVICE (IUD) INSERTION     insertion 07-18-16  . WISDOM TOOTH EXTRACTION      Current Outpatient Medications  Medication Sig Dispense Refill  . Albuterol Sulfate 108 (90 Base) MCG/ACT AEPB Inhale into the lungs.    . cetirizine (ZYRTEC) 10 MG tablet Take 10 mg by mouth daily as needed.    . diphenhydrAMINE (BENADRYL) 25 MG tablet Take 1 tablet (25 mg total) by mouth every 6 (six) hours as needed. Take every 6 hours for the next 3 days, then as needed for recurrent symptoms. 30 tablet 0  . EPINEPHrine (EPIPEN 2-PAK) 0.3 mg/0.3 mL IJ SOAJ injection Inject 0.3 mLs (0.3 mg total) into the muscle as needed for anaphylaxis. 1 Device 0  . famotidine (PEPCID) 20 MG tablet Take 1 tablet (20 mg total) by mouth 2 (two) times daily. Take twice daily for the next 3 days, then as needed for recurrent symptoms. 30 tablet 0  . hydroxychloroquine (PLAQUENIL) 200 MG tablet Take 1 tablet (200 mg total) by mouth 2 (two) times daily. 60 tablet 0  . levonorgestrel (MIRENA) 20 MCG/24HR IUD 1 each by Intrauterine route once. Implanted Fall 2013    . Multiple Vitamins-Minerals (MULTIVITAMIN PO) Take by mouth.    . naproxen sodium (ALEVE) 220 MG tablet Take 440 mg by mouth daily.    Marland Kitchen  valACYclovir (VALTREX) 500 MG tablet TAKE 1 TABLET BY MOUTH ONCE DAILY. INCREASE TO TWO  TIMES DAILY FOR 3 DAYS ONLY AT ONSET OF OUTBREAK (Patient taking differently: Take 500 mg by mouth daily. TAKE 1 TABLET BY MOUTH ONCE DAILY. INCREASE TO TWO  TIMES DAILY FOR 3 DAYS ONLY AT ONSET OF OUTBREAK) 90 tablet 3   No current facility-administered medications for this visit.     Family History  Problem Relation Age of Onset  . Hypertension Mother   . Hypertension Father   . Depression Father   . Prostate cancer Father   . Thyroid disease Sister     ROS:  Pertinent items are noted in HPI.  Otherwise, a comprehensive ROS was  negative.  Exam:   There were no vitals taken for this visit.   Ht Readings from Last 3 Encounters:  12/20/18 5\' 5"  (1.651 m)  01/12/18 5' 5.25" (1.657 m)  01/07/18 5' 5.25" (1.657 m)    General appearance: alert, cooperative and appears stated age Head: Normocephalic, without obvious abnormality, atraumatic Neck: no adenopathy, supple, symmetrical, trachea midline and thyroid normal to inspection and palpation Lungs: clear to auscultation bilaterally Breasts: normal appearance, no masses or tenderness, No nipple retraction or dimpling, No nipple discharge or bleeding, No axillary or supraclavicular adenopathy, Normal to palpation without dominant masses Heart: regular rate and rhythm Abdomen: soft, non-tender; no masses,  no organomegaly Extremities: extremities normal, atraumatic, no cyanosis or edema Skin: Skin color, texture, turgor normal. No rashes or lesions Lymph nodes: Cervical, supraclavicular, and axillary nodes normal. No abnormal inguinal nodes palpated Neurologic: Grossly normal   Pelvic: External genitalia:  no lesions              Urethra:  normal appearing urethra with no masses, tenderness or lesions              Bartholin's and Skene's: normal                 Vagina: normal appearing vagina with normal color and discharge, no lesions              Cervix: no cervical motion tenderness, no lesions and normal appearance, IUD string noted in cervical os              Pap taken: Yes.   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  Declines Rx update at this time.  Continue follow up with PCP and for Lupus as indicated.  Pap smear:    counseled on breast self exam, STD prevention, HIV risk factors and prevention, feminine hygiene, adequate intake of calcium and vitamin D, diet and exercise  return annually or prn  An After Visit Summary was printed and given to the patient.

## 2019-01-20 LAB — CYTOLOGY - PAP: Diagnosis: NEGATIVE

## 2019-01-20 NOTE — Telephone Encounter (Signed)
I called patient to discuss lab results and elevated LFTs.  Patient states about a week ago she developed fever up to 103 which lasted for few days.  She also developed some cervical lymphadenopathy.  She was placed on Z-Pak initially by her PCP.  Then she went to emergency room where she was switched to doxycycline.  She states she was taking ibuprofen every few hours to bring the temperature down.  She was also taking Tylenol and Mucinex.  Most likely her elevated LFTs are related to multiple medication she took.  She states she is afebrile now.  She has appointment with Dr. Benson Norway on Monday for evaluation of elevated LFTs. Bo Merino, MD

## 2019-01-25 ENCOUNTER — Ambulatory Visit: Payer: 59 | Admitting: Gastroenterology

## 2019-02-08 ENCOUNTER — Other Ambulatory Visit: Payer: Self-pay | Admitting: Rheumatology

## 2019-02-08 ENCOUNTER — Ambulatory Visit: Payer: 59 | Attending: Internal Medicine

## 2019-02-08 DIAGNOSIS — Z20822 Contact with and (suspected) exposure to covid-19: Secondary | ICD-10-CM

## 2019-02-08 DIAGNOSIS — M3219 Other organ or system involvement in systemic lupus erythematosus: Secondary | ICD-10-CM

## 2019-02-08 NOTE — Telephone Encounter (Signed)
Last Visit: 12/20/2018 Next Visit: 03/22/2019 Labs: 01/18/2019 LFTs are elevated and have trended up significantly.  Eye exam: 04/09/2018   Okay to refill PLQ?

## 2019-02-08 NOTE — Telephone Encounter (Signed)
I have left a message for Dr. Benson Norway.  Awaiting for his response to see if we can start her on Plaquenil.

## 2019-02-08 NOTE — Telephone Encounter (Signed)
Patient needs refill on generic Plaquenil sent to [facility] on [address]. °

## 2019-02-09 MED ORDER — HYDROXYCHLOROQUINE SULFATE 200 MG PO TABS: 200.0000 mg | ORAL_TABLET | Freq: Two times a day (BID) | ORAL | 0 refills | Status: DC

## 2019-02-09 NOTE — Telephone Encounter (Signed)
Dr. Estanislado Pandy spoke with Dr. Benson Norway and it is okay to refill PLQ.

## 2019-02-10 LAB — NOVEL CORONAVIRUS, NAA: SARS-CoV-2, NAA: NOT DETECTED

## 2019-03-14 NOTE — Progress Notes (Signed)
Virtual Visit via Video Note  I connected with [place] on 03/22/19 at  8:30 AM EST by a video enabled telemedicine application and verified that I am speaking with the correct person using two identifiers.  Location: Patient:Home  Provider: Clinic  This service was conducted via virtual visit.  Both audio and visual tools were used.  The patient was located at home. I was located in my office.  Consent was obtained prior to the virtual visit and is aware of possible charges through their insurance for this visit.  The patient is an established patient.  Dr. Corliss Skains, MD conducted the virtual visit and Sherron Ales, PA-C acted as scribe during the service.  Office staff helped with scheduling follow up visits after the service was conducted.     I discussed the limitations of evaluation and management by telemedicine and the availability of in person appointments. The patient expressed understanding and agreed to proceed.  CC: Medication monitoring  History of Present Illness: Patient is a 34 year old female with a past medical history of systemic lupus erythematosus.  She is taking Plaquenil 200 mg 1 tablet by mouth twice daily. She denies any signs or symptoms of a lupus flare.  She states she was evaluated by Dr. Elnoria Howard for elevated LFTs and has since been cleared by him. She has occasional pain in both shoulder joints.  She has occasional nocturnal pain in both shoulder joints.  She is seeing a chiropractor on a regular basis to help with her lower back pain.  She denies any oral or nasal ulcerations.  She denies any sicca symptoms.  She has not had any recent rashes, photosensitivity, or raynaud's.    Review of Systems  Constitutional: Positive for malaise/fatigue. Negative for fever.  HENT:       Denies oral or nasal ulcerations  Denies mouth dryness   Eyes: Negative for photophobia, pain, discharge and redness.       Denies eye dryness   Respiratory: Negative for cough, shortness of  breath and wheezing.   Cardiovascular: Negative for chest pain, palpitations and leg swelling.  Gastrointestinal: Positive for constipation and diarrhea. Negative for blood in stool.  Genitourinary: Negative for dysuria and urgency.  Musculoskeletal: Positive for joint pain. Negative for back pain, myalgias and neck pain.  Skin: Negative for rash.  Neurological: Negative for dizziness, weakness and headaches.  Psychiatric/Behavioral: Negative for depression and memory loss. The patient is not nervous/anxious and does not have insomnia.       Observations/Objective: Physical Exam  Constitutional: She is oriented to person, place, and time and well-developed, well-nourished, and in no distress.  HENT:  Head: Normocephalic and atraumatic.  Eyes: Conjunctivae are normal.  Pulmonary/Chest: Effort normal.  Neurological: She is alert and oriented to person, place, and time.  Psychiatric: Mood, memory, affect and judgment normal.   Patient reports morning stiffness for 0  NONE.   Patient reports nocturnal pain.  Difficulty dressing/grooming: Denies Difficulty climbing stairs: Denies Difficulty getting out of chair: Denies Difficulty using hands for taps, buttons, cutlery, and/or writing: Denies   Assessment and Plan: Visit Diagnoses: Other systemic lupus erythematosus with other organ involvement (HCC) -  ANA >1:1280, + Smith, + RNP, + SSA: She has not had any signs or symptoms of a lupus flare.  She is clinically doing well on Plaquenil 200 mg 1 tablet by mouth twice daily.  She has not had any recent rashes, photosensitivity, symptoms of Raynaud's, oral/nasal ulcerations, sicca symptoms, enlarged lymph nodes,  chest pain, or shortness of breath.  She has occasional arthralgias and fatigue.  She has not had any joint swelling recently.  She was evaluated by Dr. Benson Norway for elevated LFTs, which were related to a virus and alcohol use.  Dr. Benson Norway was not concerned about autoimmune hepatitis at this  but, he plans to continue to monitor LFTs.  She will continue taking plaquenil as prescribed.  Lab work from 01/18/19 was reviewed with the patient.  She will update lab work in 3 months and will follow up 2 weeks later.  She was advised to notify us if she develops any signs or symptoms of a flare.   She is newly engaged and is planning her wedding for January 2022.  She states shortly after getting married she would like to start planning pregnancy.  We discussed the importance of establishing with a Obgyn who specializes in high risk pregnancies.  We also stressed the importance of not planning pregnancy until she has gone at least 6 months without a lupus flare.  She was advised to continue taking plaquenil as prescribed during pregnancy.  We discussed the clinical significance of her having a positive Ro antibody. We also reviewedthe following labs: Lupus anticoagulant not detected on 03/19/17. Anticardiolipin antibodies and Beta-2 negative on 02/16/17.    High risk medication use - Plaquenil 200 mg 1 tablet twice daily.  Last Plaquenil eye exam normal on 04/09/2018. She was advised to schedule an updated eye exam.  CBC and CMP were drawn on 01/18/19.    Raynaud's syndrome without gangrene: She has not had signs or symptoms of Raynaud's recently.   Palpitations: She has not had any recent palpations.    Other fatigue: Stable   Vitamin D deficiency: Vitamin D was 47 on 06/25/18.  She is taking a vitamin D supplement.   Other medical conditions are listed as follows:   History of gastroesophageal reflux (GERD)   Diarrhea in adult patient   Dyslipidemia  Migraine  Follow Up Instructions: She will follow up in 3-4 months    I discussed the assessment and treatment plan with the patient. The patient was provided an opportunity to ask questions and all were answered. The patient agreed with the plan and demonstrated an understanding of the instructions.   The patient was advised to call  back or seek an in-person evaluation if the symptoms worsen or if the condition fails to improve as anticipated.  I provided 25 minutes of non-face-to-face time during this encounter.   Bo Merino, MD   Scribed byLovena Le Dale,PA-C

## 2019-03-22 ENCOUNTER — Encounter: Payer: Self-pay | Admitting: Rheumatology

## 2019-03-22 ENCOUNTER — Other Ambulatory Visit: Payer: Self-pay

## 2019-03-22 ENCOUNTER — Telehealth (INDEPENDENT_AMBULATORY_CARE_PROVIDER_SITE_OTHER): Payer: 59 | Admitting: Rheumatology

## 2019-03-22 DIAGNOSIS — Z8719 Personal history of other diseases of the digestive system: Secondary | ICD-10-CM

## 2019-03-22 DIAGNOSIS — Z79899 Other long term (current) drug therapy: Secondary | ICD-10-CM

## 2019-03-22 DIAGNOSIS — I7300 Raynaud's syndrome without gangrene: Secondary | ICD-10-CM

## 2019-03-22 DIAGNOSIS — M3219 Other organ or system involvement in systemic lupus erythematosus: Secondary | ICD-10-CM

## 2019-03-22 DIAGNOSIS — G43001 Migraine without aura, not intractable, with status migrainosus: Secondary | ICD-10-CM

## 2019-03-22 DIAGNOSIS — E559 Vitamin D deficiency, unspecified: Secondary | ICD-10-CM

## 2019-03-22 DIAGNOSIS — R002 Palpitations: Secondary | ICD-10-CM

## 2019-03-22 DIAGNOSIS — R5383 Other fatigue: Secondary | ICD-10-CM

## 2019-03-22 DIAGNOSIS — E785 Hyperlipidemia, unspecified: Secondary | ICD-10-CM

## 2019-05-06 ENCOUNTER — Encounter: Payer: Self-pay | Admitting: Certified Nurse Midwife

## 2019-05-16 ENCOUNTER — Telehealth: Payer: Self-pay | Admitting: Rheumatology

## 2019-05-16 DIAGNOSIS — M3219 Other organ or system involvement in systemic lupus erythematosus: Secondary | ICD-10-CM

## 2019-05-16 MED ORDER — HYDROXYCHLOROQUINE SULFATE 200 MG PO TABS: 200.0000 mg | ORAL_TABLET | Freq: Two times a day (BID) | ORAL | 0 refills | Status: DC

## 2019-05-16 NOTE — Telephone Encounter (Signed)
Patient called requesting prescription refill of Hydroxychloroquine to be sent to [facility] at [address].  Patient is requesting to pick up the prescription today before 2:30 because she is going out of town.

## 2019-05-16 NOTE — Telephone Encounter (Signed)
Last Visit: 03/22/19 Next Visit: 07/12/19 Labs: 01/18/20 CBC is WNL. LFTs are elevated and have trended up  Eye exam: 04/09/18 WNL   Patient advised she is due to update her PLQ eye exam. Patient states she has update and will contact the eye doctor to have them fax results.   Current Dose per office note on 03/22/19: Plaquenil 200 mg 1 tablet twice daily  Okay to refill Plaquenil?

## 2019-06-29 NOTE — Progress Notes (Signed)
Office Visit Note  Patient: Tammy Giles             Date of Birth: 1985-04-24           MRN: [id number]             PCP: Irena Reichmann, DO Referring: Irena Reichmann, DO Visit Date: 07/12/2019 Occupation: @GUAROCC @  Subjective:  Medication monitoring   History of Present Illness: Tammy Giles is a 34 y.o. female with history of systemic lupus erythematosus.  She is taking plaquenil 200 mg 1 tablet by mouth twice daily.  She has intermittent pain and inflammation in both hands and right shoulder joint.  She states she has joint inflammation about once a month.  She denies any recent rashes, raynaud's, oral or nasal ulcerations, sicca symptoms, chest pain, or shortness of breath.  She continues to have chronic fatigue, which has been stable. She does report recent weight gain, which she feels may be contributing to some of her increased joint pain. She states she has recently [facility] care at [facility] for weight management.      Activities of Daily Living:  Patient reports morning stiffness for 0 minutes.   Patient Denies nocturnal pain.  Difficulty dressing/grooming: Denies Difficulty climbing stairs: Denies Difficulty getting out of chair: Denies Difficulty using hands for taps, buttons, cutlery, and/or writing: Denies  Review of Systems  Constitutional: Positive for fatigue.  HENT: Negative for mouth sores, mouth dryness and nose dryness.   Eyes: Negative for pain, itching, visual disturbance and dryness.  Respiratory: Negative for cough, hemoptysis, shortness of breath and difficulty breathing.   Cardiovascular: Negative for chest pain, palpitations, hypertension and swelling in legs/feet.  Gastrointestinal: Negative for blood in stool, constipation and diarrhea.  Endocrine: Negative for increased urination.  Genitourinary: Negative for difficulty urinating and painful urination.  Musculoskeletal: Positive for arthralgias, joint pain and joint swelling. Negative for  myalgias, muscle weakness, morning stiffness, muscle tenderness and myalgias.  Skin: Negative for color change, pallor, rash, hair loss, nodules/bumps, redness, skin tightness, ulcers and sensitivity to sunlight.  Allergic/Immunologic: Negative for susceptible to infections.  Neurological: Positive for headaches. Negative for dizziness, memory loss and weakness.  Hematological: Negative for swollen glands.  Psychiatric/Behavioral: Negative for depressed mood, confusion and sleep disturbance. The patient is not nervous/anxious.     PMFS History:  Patient Active Problem List   Diagnosis Date Noted  . Class 2 obesity without serious comorbidity with body mass index (BMI) of 35.0 to 35.9 in adult 02/13/2017  . Raynaud's syndrome without gangrene 02/13/2017  . High risk medication use 02/13/2017  . Dyslipidemia 12/12/2015  . Vitamin D deficiency 12/12/2015  . Annual physical exam 12/11/2015  . Migraine without aura and with status migrainosus, not intractable 12/11/2015  . Obesity (BMI 35.0-39.9 without comorbidity) 12/11/2015  . Encounter for long-term (current) use of high-risk medication 04/26/2014  . Abnormal Pap smear of cervix 06/16/2013    Class: History of  . Lupus (systemic lupus erythematosus) (HCC) 10/22/2011    Past Medical History:  Diagnosis Date  . Abnormal Pap smear of cervix    LGSIL 2014 & 2015, ASCUS HPV HR+ 2016, ASCUS HPV HR+ 2017, 12-26-16 LGSIL  . 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 Irregular periods    has IUD  . Lupus (HCC)   . Migraines   . STD (sexually transmitted disease)    HSV2    Family History  Problem Relation Age of Onset  . Hypertension Mother   . Hypertension Father   .  Depression Father   . Prostate cancer Father   . Heart disease Father   . Thyroid disease Sister    Past Surgical History:  Procedure Laterality Date  . ACHILLES TENDON SURGERY Left 05/24/2014   Procedure: LEFT ACHILLES TENDON REPAIR;  Surgeon: Tarry Kos, MD;  Location:  [facility];  Service: Orthopedics;  Laterality: Left;  . COLPOSCOPY     2014, 2015, 2016, 2017 LGSIL, 2018  . INTRAUTERINE DEVICE (IUD) INSERTION     insertion 07-18-16  . WISDOM TOOTH EXTRACTION     Social History   Social History Narrative  . Not on file   Immunization History  Administered Date(s) Administered  . Influenza Split 11/20/2014  . Influenza,inj,Quad PF,6+ Mos 12/11/2015  . Tdap 02/18/2007, 12/26/2016     Objective: Vital Signs: BP 118/85 (BP Location: Left Arm, Patient Position: Sitting, Cuff Size: Normal)   Pulse 74   Resp 15   Ht 5\' 5"  (1.651 m)   Wt 250 lb 3.2 oz (113.5 kg)   BMI 41.64 kg/m    Physical Exam Vitals and nursing note reviewed.  Constitutional:      Appearance: She is well-developed.  HENT:     Head: Normocephalic and atraumatic.  Eyes:     Conjunctiva/sclera: Conjunctivae normal.  Pulmonary:     Effort: Pulmonary effort is normal.  Abdominal:     General: Bowel sounds are normal.     Palpations: Abdomen is soft.  Musculoskeletal:     Cervical back: Normal range of motion.  Lymphadenopathy:     Cervical: No cervical adenopathy.  Skin:    General: Skin is warm and dry.     Capillary Refill: Capillary refill takes less than 2 seconds.  Neurological:     Mental Status: She is alert and oriented to person, place, and time.  Psychiatric:        Behavior: Behavior normal.      Musculoskeletal Exam: C-spine, thoracic spine, and lumbar spine good ROM.  Shoulder joints, elbow joints, wrist joints, MCPs, PIPs, and DIPs good ROM with no synovitis.  Complete fist formation bilaterally.  Hip joints, knee joints, ankle joints, MTPs, PIPs, and DIPs good ROM with no synovitis.  No warmth or effusion of knee joints. No tenderness or synovitis of ankle joints.   CDAI Exam: CDAI Score: -- Patient Global: --; Provider Global: -- Swollen: --; Tender: -- Joint Exam 07/12/2019   No joint exam has been documented for this visit    There is currently no information documented on the homunculus. Go to the Rheumatology activity and complete the homunculus joint exam.  Investigation: No additional findings.  Imaging: No results found.  Recent Labs: Lab Results  Component Value Date   WBC 4.9 01/18/2019   HGB 12.5 01/18/2019   PLT 195 01/18/2019   NA 136 01/18/2019   K 4.3 01/18/2019   CL 100 01/18/2019   CO2 29 01/18/2019   GLUCOSE 110 (H) 01/18/2019   BUN 7 01/18/2019   CREATININE 0.89 01/18/2019   BILITOT 0.6 01/18/2019   AST 321 (H) 01/18/2019   ALT 424 (H) 01/18/2019   PROT 7.1 01/18/2019   CALCIUM 9.0 01/18/2019   GFRAA 100 01/18/2019    Speciality Comments: PLQ Eye Exam: 04/15/19 WNL @ [facility] Follow up in 1 year  Procedures:  No procedures performed Allergies: Amoxicillin and Other   Assessment / Plan:     Visit Diagnoses: Other systemic lupus erythematosus with other organ involvement (HCC) -  ANA >1:1280, + Smith, + RNP, + SSA: She has not had any signs or symptoms of a lupus flare.  She is clinically doing well on plaquenil 200 mg 1 tablet by mouth twice daily.  She has not missed any doses of PLQ and continues to tolerate it without any side effects. She has intermittent pain and inflammation in both hands and the right shoulder joint.  She has no synovitis or tenderness on examination today.  She has not had any recent rashes, photosensitivity, symptoms of raynaud's, oral or nasal ulcerations, sicca symptoms, palpitations, shortness of breath, or chest pain.  No malar rash or digital ulcerations were noted on exam.  Lungs were clear to auscultation. She has chronic fatigue which has been stable.  No cervical lymphadenopathy was palpable.  We will check autoimmune lab work today. She will continue taking plaquenil as prescribed. A refill of PLQ was sent to the pharmacy.  If she continues to have persistent pain and inflammation in both hands, we will consider combination therapy with MTX.   She was advised to notify us if she develops any new or worsening symptoms.  She will follow up in 3 months.   - Plan: Urinalysis, Routine w reflex microscopic, Anti-DNA antibody, double-stranded, C3 and C4, Sedimentation rate, VITAMIN D 25 Hydroxy (Vit-D Deficiency, Fractures), Sjogrens syndrome-A extractable nuclear antibody, Anti-Smith antibody, RNP Antibody  High risk medication use - Plaquenil 200 mg 1 tablet by mouth twice daily. 07/08/19: Creatinine 0.88, GFR WNL, LFTs WNL, TSH WNL. WBC count 5.4, Hgb 14.2, Plts 200. PLQ Eye Exam: 04/15/19 WNL @ [facility] Follow up in 1 year.  She was encouraged to receive the covid-19 vaccination.   Raynaud's syndrome without gangrene: Not currently active. She has no digital ulcerations or signs of gangrene.   Palpitations: She has not had any palpations recently.   Other fatigue: Chronic but stable.   Vitamin D deficiency -Vitamin D was 47 on 06/25/18.  We will recheck vitamin D today. Plan: VITAMIN D 25 Hydroxy (Vit-D Deficiency, Fractures)  Other medical conditions are listed as follows:   Dyslipidemia  History of gastroesophageal reflux (GERD)  Migraine   Orders: Orders Placed This Encounter  Procedures  . Urinalysis, Routine w reflex microscopic  . Anti-DNA antibody, double-stranded  . C3 and C4  . Sedimentation rate  . VITAMIN D 25 Hydroxy (Vit-D Deficiency, Fractures)  . Sjogrens syndrome-A extractable nuclear antibody  . Anti-Smith antibody  . RNP Antibody   Meds ordered this encounter  Medications  . hydroxychloroquine (PLAQUENIL) 200 MG tablet    Sig: Take 1 tablet (200 mg total) by mouth 2 (two) times daily.    Dispense:  180 tablet    Refill:  0    Face-to-face time spent with patient was 30 minutes. Greater than 50% of time was spent in counseling and coordination of care.  Follow-Up Instructions: Return in about 3 months (around 10/12/2019) for Systemic lupus erythematosus.   Hazel Sams, PA-C  I examined and  evaluated the patient with Hazel Sams PA.  Patient had no synovitis on examination today.  She has been tolerating medication well and will continue the current medication.  The plan of care was discussed as noted above.  Bo Merino, MD  Note - This record has been created using Editor, commissioning.  Chart creation errors have been sought, but may not always  have been located. Such creation errors do not reflect on  the standard of medical care.

## 2019-07-12 ENCOUNTER — Other Ambulatory Visit: Payer: Self-pay

## 2019-07-12 ENCOUNTER — Encounter: Payer: Self-pay | Admitting: Physician Assistant

## 2019-07-12 ENCOUNTER — Ambulatory Visit: Payer: 59 | Admitting: Rheumatology

## 2019-07-12 VITALS — BP 118/85 | HR 74 | Resp 15 | Ht 65.0 in | Wt 250.2 lb

## 2019-07-12 DIAGNOSIS — Z8719 Personal history of other diseases of the digestive system: Secondary | ICD-10-CM

## 2019-07-12 DIAGNOSIS — E559 Vitamin D deficiency, unspecified: Secondary | ICD-10-CM

## 2019-07-12 DIAGNOSIS — R5383 Other fatigue: Secondary | ICD-10-CM

## 2019-07-12 DIAGNOSIS — Z79899 Other long term (current) drug therapy: Secondary | ICD-10-CM | POA: Diagnosis not present

## 2019-07-12 DIAGNOSIS — M3219 Other organ or system involvement in systemic lupus erythematosus: Secondary | ICD-10-CM | POA: Diagnosis not present

## 2019-07-12 DIAGNOSIS — R002 Palpitations: Secondary | ICD-10-CM

## 2019-07-12 DIAGNOSIS — G43001 Migraine without aura, not intractable, with status migrainosus: Secondary | ICD-10-CM

## 2019-07-12 DIAGNOSIS — E785 Hyperlipidemia, unspecified: Secondary | ICD-10-CM

## 2019-07-12 DIAGNOSIS — I7300 Raynaud's syndrome without gangrene: Secondary | ICD-10-CM | POA: Diagnosis not present

## 2019-07-12 MED ORDER — HYDROXYCHLOROQUINE SULFATE 200 MG PO TABS: 200.0000 mg | ORAL_TABLET | Freq: Two times a day (BID) | ORAL | 0 refills | Status: DC

## 2019-07-13 LAB — RNP ANTIBODY: Ribonucleic Protein(ENA) Antibody, IgG: >8 AI — AB

## 2019-07-13 LAB — URINALYSIS, ROUTINE W REFLEX MICROSCOPIC
Bilirubin Urine: NEGATIVE
Glucose, UA: NEGATIVE
Hgb urine dipstick: NEGATIVE
Ketones, ur: NEGATIVE
Leukocytes,Ua: NEGATIVE
Nitrite: NEGATIVE
Protein, ur: NEGATIVE
Specific Gravity, Urine: 1.014 (ref 1.001–1.03)
pH: 6 (ref 5.0–8.0)

## 2019-07-13 LAB — SJOGRENS SYNDROME-A EXTRACTABLE NUCLEAR ANTIBODY: SSA (Ro) (ENA) Antibody, IgG: >8 AI — AB

## 2019-07-13 LAB — ANTI-DNA ANTIBODY, DOUBLE-STRANDED: ds DNA Ab: 3 IU/mL

## 2019-07-13 LAB — SEDIMENTATION RATE: Sed Rate: 9 mm/h (ref 0–20)

## 2019-07-13 LAB — VITAMIN D 25 HYDROXY (VIT D DEFICIENCY, FRACTURES): Vit D, 25-Hydroxy: 34 ng/mL (ref 30–100)

## 2019-07-13 LAB — C3 AND C4
C3 Complement: 103 mg/dL (ref 83–193)
C4 Complement: 16 mg/dL (ref 15–57)

## 2019-07-13 LAB — ANTI-SMITH ANTIBODY: ENA SM Ab Ser-aCnc: 1.7 AI — AB

## 2019-07-13 NOTE — Progress Notes (Signed)
Smith antibody, Ro antibody, and RNP remain positive but titers are stable. UA normal.  DsDNA negative.  ESR and complements WNL.  Labs are not consistent with a flare.   Vitamin D WNL.  Please recommend a maintenance dose of vitamin D.

## 2019-09-28 NOTE — Progress Notes (Signed)
Office Visit Note  Patient: Tammy Giles             Date of Birth: 05-09-1985           MRN: [id number]             PCP: Janie Morning, DO Referring: Janie Morning, DO Visit Date: 10/12/2019 Occupation: _0 @  Subjective:  Right shoulder pain   History of Present Illness: Tammy Giles is a 34 y.o. female with history of systemic lupus erythematosus.  Patient is taking Plaquenil 200 mg 1 tablet by mouth twice daily.  She has not missed any doses of Plaquenil recently.  She denies any recent systemic lupus flares.  She states that she has been experiencing some increased discomfort in her right shoulder and has occasional nocturnal pain.  She has also noticed a nodule on the extensor surface of her right elbow which is tender with compression.  She experiences intermittent popping in the right hip but denies any discomfort or groin pain at this time.  She denies any joint swelling currently.  She has not had any recent rashes and continues to wear sunscreen on a daily basis.  She denies any symptoms of Raynaud's.  She denies any oral or nasal ulcerations or sicca symptoms.  She continues to have chronic fatigue and has not been sleeping well the past week or so.  She states that she has been under increased rest this week which she feels is contributing.  She has not noticed any enlarged lymph nodes.  She denies any chest pain, palpitations, or shortness of breath.  She denies any other new concerns or questions at this time.  Activities of Daily Living:  Patient reports morning stiffness for a few minutes.   Patient Reports nocturnal pain.  Difficulty dressing/grooming: Denies Difficulty climbing stairs: Denies Difficulty getting out of chair: Denies Difficulty using hands for taps, buttons, cutlery, and/or writing: Denies  Review of Systems  Constitutional: Positive for fatigue.  HENT: Positive for nose dryness. Negative for mouth sores and mouth dryness.   Eyes: Negative for  pain, itching, visual disturbance and dryness.  Respiratory: Negative for cough, hemoptysis, shortness of breath and difficulty breathing.   Cardiovascular: Negative for chest pain, palpitations, hypertension and swelling in legs/feet.  Gastrointestinal: Positive for constipation. Negative for blood in stool and diarrhea.  Endocrine: Negative for increased urination.  Genitourinary: Negative for difficulty urinating and painful urination.  Musculoskeletal: Positive for arthralgias, joint pain, joint swelling, myalgias, morning stiffness, muscle tenderness and myalgias. Negative for muscle weakness.  Skin: Negative for color change, pallor, rash, hair loss, nodules/bumps, redness, skin tightness, ulcers and sensitivity to sunlight.  Allergic/Immunologic: Negative for susceptible to infections.  Neurological: Positive for headaches. Negative for dizziness, numbness, memory loss and weakness.  Hematological: Negative for bruising/bleeding tendency and swollen glands.  Psychiatric/Behavioral: Negative for depressed mood, confusion and sleep disturbance. The patient is not nervous/anxious.     PMFS History:  Patient Active Problem List   Diagnosis Date Noted  . Class 2 obesity without serious comorbidity with body mass index (BMI) of 35.0 to 35.9 in adult 02/13/2017  . Raynaud's syndrome without gangrene 02/13/2017  . High risk medication use 02/13/2017  . Dyslipidemia 12/12/2015  . Vitamin D deficiency 12/12/2015  . Annual physical exam 12/11/2015  . Migraine without aura and with status migrainosus, not intractable 12/11/2015  . Obesity (BMI 35.0-39.9 without comorbidity) 12/11/2015  . Encounter for long-term (current) use of high-risk medication 04/26/2014  . Abnormal Pap smear  of cervix 06/16/2013    Class: History of  . Lupus (systemic lupus erythematosus) ([facility]) 10/22/2011    Past Medical History:  Diagnosis Date  . Abnormal Pap smear of cervix    LGSIL 2014 & 2015, ASCUS HPV HR+  2016, ASCUS HPV HR+ 2017, 12-26-16 LGSIL  . 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 Irregular periods    has IUD  . Lupus ([place])   . Migraines   . STD (sexually transmitted disease)    HSV2    Family History  Problem Relation Age of Onset  . Hypertension Mother   . Hypertension Father   . Depression Father   . Prostate cancer Father   . Heart disease Father   . Thyroid disease Sister    Past Surgical History:  Procedure Laterality Date  . ACHILLES TENDON SURGERY Left 05/24/2014   Procedure: LEFT ACHILLES TENDON REPAIR;  Surgeon: Leandrew Koyanagi, MD;  Location: [facility];  Service: Orthopedics;  Laterality: Left;  . COLPOSCOPY     2014, 2015, 2016, 2017 LGSIL, 2018  . INTRAUTERINE DEVICE (IUD) INSERTION     insertion 07-18-16  . WISDOM TOOTH EXTRACTION     Social History   Social History Narrative  . Not on file   Immunization History  Administered Date(s) Administered  . Influenza Split 11/20/2014  . Influenza,inj,Quad PF,6+ Mos 12/11/2015  . PFIZER SARS-COV-2 Vaccination 07/17/2019, 08/07/2019  . Tdap 02/18/2007, 12/26/2016     Objective: Vital Signs: BP 122/82 (BP Location: Left Arm, Patient Position: Sitting, Cuff Size: Normal)   Pulse 75   Resp 15   Ht _0  (1.651 m)   Wt 247 lb 6.4 oz (112.2 kg)   BMI 41.17 kg/m    Physical Exam Vitals and nursing note reviewed.  Constitutional:      Appearance: She is well-developed.  HENT:     Head: Normocephalic and atraumatic.  Eyes:     Conjunctiva/sclera: Conjunctivae normal.  Pulmonary:     Effort: Pulmonary effort is normal.  Abdominal:     Palpations: Abdomen is soft.  Musculoskeletal:     Cervical back: Normal range of motion.  Lymphadenopathy:     Cervical: No cervical adenopathy.  Skin:    General: Skin is warm and dry.     Capillary Refill: Capillary refill takes less than 2 seconds.  Neurological:     Mental Status: She is alert and oriented to person, place, and time.  Psychiatric:         Behavior: Behavior normal.      Musculoskeletal Exam: C-spine, thoracic spine, lumbar spine have good range of motion.  Shoulder joints have good range of motion with some discomfort in the right shoulder.  Elbow joints have good range of motion.  She has a nodule on the extensor surface of the right elbow suspicious for rheumatoid nodule.  Wrist joints, MCPs, PIPs, and DIPs have good range of motion with no synovitis.  She has complete fist formation bilaterally.  Hip joints have good range of motion with no discomfort.  Knee joints have good range of motion with no warmth or effusion.  Ankle joints have good range of motion with no tenderness or inflammation  CDAI Exam: CDAI Score: -- Patient Global: --; Provider Global: -- Swollen: --; Tender: -- Joint Exam 10/12/2019   No joint exam has been documented for this visit   There is currently no information documented on the homunculus. Go to the Rheumatology activity and complete the homunculus joint  exam.  Investigation: No additional findings.  Imaging: No results found.  Recent Labs: Lab Results  Component Value Date   WBC 4.9 01/18/2019   HGB 12.5 01/18/2019   PLT 195 01/18/2019   NA 136 01/18/2019   K 4.3 01/18/2019   CL 100 01/18/2019   CO2 29 01/18/2019   GLUCOSE 110 (H) 01/18/2019   BUN 7 01/18/2019   CREATININE 0.89 01/18/2019   BILITOT 0.6 01/18/2019   AST 321 (H) 01/18/2019   ALT 424 (H) 01/18/2019   PROT 7.1 01/18/2019   CALCIUM 9.0 01/18/2019   GFRAA 100 01/18/2019    Speciality Comments: PLQ Eye Exam: 04/15/19 WNL @ [facility] Follow up in 1 year  Procedures:  No procedures performed Allergies: Amoxicillin and Other   Assessment / Plan:     Visit Diagnoses: Other systemic lupus erythematosus with other organ involvement (HCC) - ANA >1:1280, + Smith, + RNP, + SSA: She has not had any signs or symptoms of a systemic lupus flare.  She is clinically doing well on Plaquenil 200 mg 1 tablet by mouth twice  daily.  She has not missed any doses of Plaquenil recently.  She has been experiencing some increased discomfort in the right shoulder but has good range of motion on exam.  She was given a handout of shoulder joint exercises to perform.  She declined a shoulder joint x-ray at this time.  A nodule is palpable on the extensor surface of the right elbow suspicious for a rheumatoid nodule.  She declined referral for a biopsy at this time.  We will check RF, anti-CCP, and _0 eta today since she has a strong family history of rheumatoid arthritis.  She has no synovitis on exam.  She has not had any recent rashes, hair loss, symptoms of Raynaud's, sicca symptoms, oral or nasal ulcerations, chest pain, shortness of breath, palpitations, or enlarged lymph nodes.  Autoimmune lab work obtained on 07/12/2019 was reviewed with the patient today in the office.  Double-stranded DNA was negative, ESR within normal limits, and complements within normal limits.  We will update autoimmune lab work today.  She will continue taking Plaquenil as prescribed.  A refill was sent to the pharmacy.  She was advised to notify us if she develops any new or worsening symptoms.  She will follow-up in the office in 5 months. - Plan: hydroxychloroquine (PLAQUENIL) 200 MG tablet, C3 and C4, Anti-DNA antibody, double-stranded, Sedimentation rate, Urinalysis, Routine w reflex microscopic  High risk medication use - Plaquenil 200 mg 1 tablet by mouth twice daily. PLQ Eye Exam: 04/15/19.  She is due to update CBC and CMP today.  Orders were released.- Plan: COMPLETE METABOLIC PANEL WITH GFR, CBC with Differential/Platelet She has received both COVID-19 vaccinations and was encouraged to receive the booster dose.  She is advised to notify us if she develops a COVID-19 infection in order to receive the antibody infusion.  She was also advised to continue to wear a mask and social distance.  She voiced understanding.   Raynaud's syndrome without  gangrene: She has not had any symptoms of Raynaud's.  No digital stations or signs of gangrene were noted.  She has good capillary refill on exam.   Palpitations: She has not had any recent palpitations.  Other fatigue: Chronic.  Her fatigue has been slightly more significant this past week but she has not been sleeping well at night which she feels is contributing.  We discussed the importance of  regular exercise and good sleep hygiene.  Vitamin D deficiency -vitamin D was 34 on 07/12/2019.  She has not been taking vitamin D supplement recently and would like her vitamin D level rechecked.  Order for vitamin D was released today.  Plan: VITAMIN D 25 Hydroxy (Vit-D Deficiency, Fractures)  Nodule of skin of right upper extremity -She has a palpable nodule on the extensor surface of the right elbow which raises a suspicion for rheumatoid nodule.  We discussed that to confirm the diagnosis she would require biopsy but she declined at this time.  She does have a family history of rheumatoid arthritis so we will check the following labs today.  She has no other clinical features of rheumatoid arthritis at this time.  No synovitis was noted on exam.  Plan: 14-3-3 eta Protein, Cyclic citrul peptide antibody, IgG, Rheumatoid factor, Uric acid  Other medical conditions are listed as follows:   Migraine   Dyslipidemia  History of gastroesophageal reflux (GERD)    Orders: Orders Placed This Encounter  Procedures  . COMPLETE METABOLIC PANEL WITH GFR  . CBC with Differential/Platelet  . VITAMIN D 25 Hydroxy (Vit-D Deficiency, Fractures)  . 14-3-3 eta Protein  . Cyclic citrul peptide antibody, IgG  . Rheumatoid factor  . Uric acid  . C3 and C4  . Anti-DNA antibody, double-stranded  . Sedimentation rate  . Urinalysis, Routine w reflex microscopic   Meds ordered this encounter  Medications  . hydroxychloroquine (PLAQUENIL) 200 MG tablet    Sig: Take 1 tablet (200 mg total) by mouth 2 (two) times  daily.    Dispense:  180 tablet    Refill:  0    Follow-Up Instructions: Return in about 5 months (around 03/13/2020) for Systemic lupus erythematosus.   Ofilia Neas, PA-C  Note - This record has been created using Dragon software.  Chart creation errors have been sought, but may not always  have been located. Such creation errors do not reflect on  the standard of medical care.

## 2019-10-12 ENCOUNTER — Ambulatory Visit: Payer: 59 | Admitting: Physician Assistant

## 2019-10-12 ENCOUNTER — Other Ambulatory Visit: Payer: Self-pay

## 2019-10-12 ENCOUNTER — Encounter: Payer: Self-pay | Admitting: Physician Assistant

## 2019-10-12 VITALS — BP 122/82 | HR 75 | Resp 15 | Ht 65.0 in | Wt 247.4 lb

## 2019-10-12 DIAGNOSIS — M3219 Other organ or system involvement in systemic lupus erythematosus: Secondary | ICD-10-CM | POA: Diagnosis not present

## 2019-10-12 DIAGNOSIS — I7300 Raynaud's syndrome without gangrene: Secondary | ICD-10-CM

## 2019-10-12 DIAGNOSIS — Z79899 Other long term (current) drug therapy: Secondary | ICD-10-CM

## 2019-10-12 DIAGNOSIS — R5383 Other fatigue: Secondary | ICD-10-CM

## 2019-10-12 DIAGNOSIS — E559 Vitamin D deficiency, unspecified: Secondary | ICD-10-CM

## 2019-10-12 DIAGNOSIS — R002 Palpitations: Secondary | ICD-10-CM | POA: Diagnosis not present

## 2019-10-12 DIAGNOSIS — Z8719 Personal history of other diseases of the digestive system: Secondary | ICD-10-CM

## 2019-10-12 DIAGNOSIS — G43001 Migraine without aura, not intractable, with status migrainosus: Secondary | ICD-10-CM

## 2019-10-12 DIAGNOSIS — R2231 Localized swelling, mass and lump, right upper limb: Secondary | ICD-10-CM

## 2019-10-12 DIAGNOSIS — E785 Hyperlipidemia, unspecified: Secondary | ICD-10-CM

## 2019-10-12 MED ORDER — HYDROXYCHLOROQUINE SULFATE 200 MG PO TABS: 200.0000 mg | ORAL_TABLET | Freq: Two times a day (BID) | ORAL | 0 refills | Status: DC

## 2019-10-12 NOTE — Patient Instructions (Addendum)
COVID-19 vaccine recommendations:   COVID-19 vaccine is recommended for everyone (unless you are allergic to a vaccine component), even if you are on a medication that suppresses your immune system.   If you are on Methotrexate, Cellcept (mycophenolate), Rinvoq, Xeljanz, and Olumiant- hold the medication for 1 week after each vaccine. Hold Methotrexate for 2 weeks after the single dose COVID-19 vaccine.   If you are on Orencia subcutaneous injection - hold medication one week prior to and one week after the first COVID-19 vaccine dose (only).   If you are on Orencia IV infusions- time vaccination administration so that the first COVID-19 vaccination will occur four weeks after the infusion and postpone the subsequent infusion by one week.   If you are on Cyclophosphamide or Rituxan infusions please contact your doctor prior to receiving the COVID-19 vaccine.   Do not take Tylenol or any anti-inflammatory medications (NSAIDs) 24 hours prior to the COVID-19 vaccination.   There is no direct evidence about the efficacy of the COVID-19 vaccine in individuals who are on medications that suppress the immune system.   Even if you are fully vaccinated, and you are on any medications that suppress your immune system, please continue to wear a mask, maintain at least six feet social distance and practice hand hygiene.   If you develop a COVID-19 infection, please contact your PCP or our office to determine if you need antibody infusion.  The booster vaccine is now available for immunocompromised patients. It is advised that if you had Pfizer vaccine you should get Pfizer booster.  If you had a Moderna vaccine then you should get a Moderna booster. Johnson and Johnson does not have a booster vaccine at this time.  Please see the following web sites for updated information.    https://www.rheumatology.org/Portals/0/Files/COVID-19-Vaccination-Patient-Resources.pdf  https://www.rheumatology.org/About-Us/Newsroom/Press-Releases/ID/1159   Shoulder Exercises Ask your health care provider which exercises are safe for you. Do exercises exactly as told by your health care provider and adjust them as directed. It is normal to feel mild stretching, pulling, tightness, or discomfort as you do these exercises. Stop right away if you feel sudden pain or your pain gets worse. Do not begin these exercises until told by your health care provider. Stretching exercises External rotation and abduction This exercise is sometimes called corner stretch. This exercise rotates your arm outward (external rotation) and moves your arm out from your body (abduction). 1. Stand in a doorway with one of your feet slightly in front of the other. This is called a staggered stance. If you cannot reach your forearms to the door frame, stand facing a corner of a room. 2. Choose one of the following positions as told by your health care provider: ? Place your hands and forearms on the door frame above your head. ? Place your hands and forearms on the door frame at the height of your head. ? Place your hands on the door frame at the height of your elbows. 3. Slowly move your weight onto your front foot until you feel a stretch across your chest and in the front of your shoulders. Keep your head and chest upright and keep your abdominal muscles tight. 4. Hold for __________ seconds. 5. To release the stretch, shift your weight to your back foot. Repeat __________ times. Complete this exercise __________ times a day. Extension, standing 1. Stand and hold a broomstick, a cane, or a similar object behind your back. ? Your hands should be a little wider than shoulder width apart. ?   Your palms should face away from your back. 2. Keeping your elbows straight and your shoulder muscles relaxed, move the  stick away from your body until you feel a stretch in your shoulders (extension). ? Avoid shrugging your shoulders while you move the stick. Keep your shoulder blades tucked down toward the middle of your back. 3. Hold for __________ seconds. 4. Slowly return to the starting position. Repeat __________ times. Complete this exercise __________ times a day. Range-of-motion exercises Pendulum  1. Stand near a wall or a surface that you can hold onto for balance. 2. Bend at the waist and let your left / right arm hang straight down. Use your other arm to support you. Keep your back straight and do not lock your knees. 3. Relax your left / right arm and shoulder muscles, and move your hips and your trunk so your left / right arm swings freely. Your arm should swing because of the motion of your body, not because you are using your arm or shoulder muscles. 4. Keep moving your hips and trunk so your arm swings in the following directions, as told by your health care provider: ? Side to side. ? Forward and backward. ? In clockwise and counterclockwise circles. 5. Continue each motion for __________ seconds, or for as long as told by your health care provider. 6. Slowly return to the starting position. Repeat __________ times. Complete this exercise __________ times a day. Shoulder flexion, standing  1. Stand and hold a broomstick, a cane, or a similar object. Place your hands a little more than shoulder width apart on the object. Your left / right hand should be palm up, and your other hand should be palm down. 2. Keep your elbow straight and your shoulder muscles relaxed. Push the stick up with your healthy arm to raise your left / right arm in front of your body, and then over your head until you feel a stretch in your shoulder (flexion). ? Avoid shrugging your shoulder while you raise your arm. Keep your shoulder blade tucked down toward the middle of your back. 3. Hold for __________  seconds. 4. Slowly return to the starting position. Repeat __________ times. Complete this exercise __________ times a day. Shoulder abduction, standing 1. Stand and hold a broomstick, a cane, or a similar object. Place your hands a little more than shoulder width apart on the object. Your left / right hand should be palm up, and your other hand should be palm down. 2. Keep your elbow straight and your shoulder muscles relaxed. Push the object across your body toward your left / right side. Raise your left / right arm to the side of your body (abduction) until you feel a stretch in your shoulder. ? Do not raise your arm above shoulder height unless your health care provider tells you to do that. ? If directed, raise your arm over your head. ? Avoid shrugging your shoulder while you raise your arm. Keep your shoulder blade tucked down toward the middle of your back. 3. Hold for __________ seconds. 4. Slowly return to the starting position. Repeat __________ times. Complete this exercise __________ times a day. Internal rotation  1. Place your left / right hand behind your back, palm up. 2. Use your other hand to dangle an exercise band, a towel, or a similar object over your shoulder. Grasp the band with your left / right hand so you are holding on to both ends. 3. Gently pull up on the band   until you feel a stretch in the front of your left / right shoulder. The movement of your arm toward the center of your body is called internal rotation. ? Avoid shrugging your shoulder while you raise your arm. Keep your shoulder blade tucked down toward the middle of your back. 4. Hold for __________ seconds. 5. Release the stretch by letting go of the band and lowering your hands. Repeat __________ times. Complete this exercise __________ times a day. Strengthening exercises External rotation  1. Sit in a stable chair without armrests. 2. Secure an exercise band to a stable object at elbow height on  your left / right side. 3. Place a soft object, such as a folded towel or a small pillow, between your left / right upper arm and your body to move your elbow about 4 inches (10 cm) away from your side. 4. Hold the end of the exercise band so it is tight and there is no slack. 5. Keeping your elbow pressed against the soft object, slowly move your forearm out, away from your abdomen (external rotation). Keep your body steady so only your forearm moves. 6. Hold for __________ seconds. 7. Slowly return to the starting position. Repeat __________ times. Complete this exercise __________ times a day. Shoulder abduction  1. Sit in a stable chair without armrests, or stand up. 2. Hold a __________ weight in your left / right hand, or hold an exercise band with both hands. 3. Start with your arms straight down and your left / right palm facing in, toward your body. 4. Slowly lift your left / right hand out to your side (abduction). Do not lift your hand above shoulder height unless your health care provider tells you that this is safe. ? Keep your arms straight. ? Avoid shrugging your shoulder while you do this movement. Keep your shoulder blade tucked down toward the middle of your back. 5. Hold for __________ seconds. 6. Slowly lower your arm, and return to the starting position. Repeat __________ times. Complete this exercise __________ times a day. Shoulder extension 1. Sit in a stable chair without armrests, or stand up. 2. Secure an exercise band to a stable object in front of you so it is at shoulder height. 3. Hold one end of the exercise band in each hand. Your palms should face each other. 4. Straighten your elbows and lift your hands up to shoulder height. 5. Step back, away from the secured end of the exercise band, until the band is tight and there is no slack. 6. Squeeze your shoulder blades together as you pull your hands down to the sides of your thighs (extension). Stop when your  hands are straight down by your sides. Do not let your hands go behind your body. 7. Hold for __________ seconds. 8. Slowly return to the starting position. Repeat __________ times. Complete this exercise __________ times a day. Shoulder row 1. Sit in a stable chair without armrests, or stand up. 2. Secure an exercise band to a stable object in front of you so it is at waist height. 3. Hold one end of the exercise band in each hand. Position your palms so that your thumbs are facing the ceiling (neutral position). 4. Bend each of your elbows to a 90-degree angle (right angle) and keep your upper arms at your sides. 5. Step back until the band is tight and there is no slack. 6. Slowly pull your elbows back behind you. 7. Hold for __________ seconds. 8.   Slowly return to the starting position. Repeat __________ times. Complete this exercise __________ times a day. Shoulder press-ups  1. Sit in a stable chair that has armrests. Sit upright, with your feet flat on the floor. 2. Put your hands on the armrests so your elbows are bent and your fingers are pointing forward. Your hands should be about even with the sides of your body. 3. Push down on the armrests and use your arms to lift yourself off the chair. Straighten your elbows and lift yourself up as much as you comfortably can. ? Move your shoulder blades down, and avoid letting your shoulders move up toward your ears. ? Keep your feet on the ground. As you get stronger, your feet should support less of your body weight as you lift yourself up. 4. Hold for __________ seconds. 5. Slowly lower yourself back into the chair. Repeat __________ times. Complete this exercise __________ times a day. Wall push-ups  1. Stand so you are facing a stable wall. Your feet should be about one arm-length away from the wall. 2. Lean forward and place your palms on the wall at shoulder height. 3. Keep your feet flat on the floor as you bend your elbows and  lean forward toward the wall. 4. Hold for __________ seconds. 5. Straighten your elbows to push yourself back to the starting position. Repeat __________ times. Complete this exercise __________ times a day. This information is not intended to replace advice given to you by your health care provider. Make sure you discuss any questions you have with your health care provider. Document Revised: 05/28/2018 Document Reviewed: 03/05/2018 [employer] Patient Education  2020 [employer]  

## 2019-10-18 NOTE — Progress Notes (Signed)
CBC and CMP WNL.  Uric acid WNL.  ESR WNL.  Vitamin D is WNL-30.  Please advise the patient to continue a maintenance dose of vitamin D.   Anti-CCP and RF negative.  UA normal.  Complements WNL and dsDNA is negative.

## 2019-10-19 ENCOUNTER — Other Ambulatory Visit: Payer: Self-pay | Admitting: Physician Assistant

## 2019-10-19 DIAGNOSIS — M3219 Other organ or system involvement in systemic lupus erythematosus: Secondary | ICD-10-CM

## 2019-10-21 LAB — COMPLETE METABOLIC PANEL WITH GFR
AG Ratio: 1.3 (calc) (ref 1.0–2.5)
ALT: 11 U/L (ref 6–29)
AST: 14 U/L (ref 10–30)
Albumin: 4.2 g/dL (ref 3.6–5.1)
Alkaline phosphatase (APISO): 70 U/L (ref 31–125)
BUN: 13 mg/dL (ref 7–25)
CO2: 24 mmol/L (ref 20–32)
Calcium: 9.1 mg/dL (ref 8.6–10.2)
Chloride: 107 mmol/L (ref 98–110)
Creat: 0.94 mg/dL (ref 0.50–1.10)
GFR, Est African American: 93 mL/min/{1.73_m2} (ref >=60)
GFR, Est Non African American: 80 mL/min/{1.73_m2} (ref >=60)
Globulin: 3.3 g/dL (calc) (ref 1.9–3.7)
Glucose, Bld: 71 mg/dL (ref 65–99)
Potassium: 4.3 mmol/L (ref 3.5–5.3)
Sodium: 137 mmol/L (ref 135–146)
Total Bilirubin: 0.3 mg/dL (ref 0.2–1.2)
Total Protein: 7.5 g/dL (ref 6.1–8.1)

## 2019-10-21 LAB — URINALYSIS, ROUTINE W REFLEX MICROSCOPIC
Bilirubin Urine: NEGATIVE
Glucose, UA: NEGATIVE
Hgb urine dipstick: NEGATIVE
Ketones, ur: NEGATIVE
Leukocytes,Ua: NEGATIVE
Nitrite: NEGATIVE
Protein, ur: NEGATIVE
Specific Gravity, Urine: 1.02 (ref 1.001–1.03)
pH: 7 (ref 5.0–8.0)

## 2019-10-21 LAB — CBC WITH DIFFERENTIAL/PLATELET
Absolute Monocytes: 539 cells/uL (ref 200–950)
Basophils Absolute: 29 cells/uL (ref 0–200)
Basophils Relative: 0.6 %
Eosinophils Absolute: 49 cells/uL (ref 15–500)
Eosinophils Relative: 1 %
HCT: 37.7 % (ref 35.0–45.0)
Hemoglobin: 12.7 g/dL (ref 11.7–15.5)
Lymphs Abs: 1465 cells/uL (ref 850–3900)
MCH: 32.6 pg (ref 27.0–33.0)
MCHC: 33.7 g/dL (ref 32.0–36.0)
MCV: 96.7 fL (ref 80.0–100.0)
MPV: 12.4 fL (ref 7.5–12.5)
Monocytes Relative: 11 %
Neutro Abs: 2818 cells/uL (ref 1500–7800)
Neutrophils Relative %: 57.5 %
Platelets: 173 10*3/uL (ref 140–400)
RBC: 3.9 10*6/uL (ref 3.80–5.10)
RDW: 12.5 % (ref 11.0–15.0)
Total Lymphocyte: 29.9 %
WBC: 4.9 10*3/uL (ref 3.8–10.8)

## 2019-10-21 LAB — 14-3-3 ETA PROTEIN: 14-3-3 eta Protein: <0.2 ng/mL (ref <0.2)

## 2019-10-21 LAB — RHEUMATOID FACTOR: Rheumatoid fact SerPl-aCnc: <14 IU/mL (ref <14)

## 2019-10-21 LAB — ANTI-DNA ANTIBODY, DOUBLE-STRANDED: ds DNA Ab: 3 IU/mL

## 2019-10-21 LAB — VITAMIN D 25 HYDROXY (VIT D DEFICIENCY, FRACTURES): Vit D, 25-Hydroxy: 30 ng/mL (ref 30–100)

## 2019-10-21 LAB — CYCLIC CITRUL PEPTIDE ANTIBODY, IGG: Cyclic Citrullin Peptide Ab: <16 UNITS

## 2019-10-21 LAB — C3 AND C4
C3 Complement: 113 mg/dL (ref 83–193)
C4 Complement: 20 mg/dL (ref 15–57)

## 2019-10-21 LAB — SEDIMENTATION RATE: Sed Rate: 11 mm/h (ref 0–20)

## 2019-10-21 LAB — URIC ACID: Uric Acid, Serum: 5.4 mg/dL (ref 2.5–7.0)

## 2019-10-25 NOTE — Progress Notes (Signed)
14-3-3 eta negative.

## 2019-11-08 ENCOUNTER — Telehealth: Payer: Self-pay | Admitting: Rheumatology

## 2019-11-08 NOTE — Telephone Encounter (Signed)
I called patient and discussed that should be unusual for her to develop hives from Plaquenil after 5 years.  I have advised her to look at all the new product she is used.  It is possible she may be is allergic to Neosporin as well.  I have advised her to contact her PCP for evaluation.

## 2019-11-08 NOTE — Telephone Encounter (Signed)
Patient states the hives started on 11/03/2019. Patient states she she placed neosporin on a scrape on her knee. Patient states that's where the hives started. Patient states they have spread to other places including her hands. Patient denies any other changes. Patient states she does take Aleve daily.  Patient she is on PLQ and taking 400 mg at night which she has been on for over 5 years. Please advise.

## 2019-11-08 NOTE — Telephone Encounter (Signed)
Patient calling to let you know she is breaking out in hives all over her body. Patient wants to know if this could be normal with her Lupus diagnosis? Please call to advise.

## 2019-11-23 ENCOUNTER — Telehealth: Payer: Self-pay

## 2019-11-23 DIAGNOSIS — M3219 Other organ or system involvement in systemic lupus erythematosus: Secondary | ICD-10-CM

## 2019-11-23 MED ORDER — HYDROXYCHLOROQUINE SULFATE 200 MG PO TABS: 200.0000 mg | ORAL_TABLET | Freq: Two times a day (BID) | ORAL | 0 refills | Status: DC

## 2019-11-23 NOTE — Telephone Encounter (Signed)
Patient left a voicemail requesting prescription refill of Hydroxychloroquine to be sent to [facility] on [address].  Patient states she is out of medication.

## 2019-11-23 NOTE — Telephone Encounter (Signed)
Patient states she never got the prescription from [facility] that was sent on 10/12/2019. Patient had to fill with [facility]. Patient is out of medication and needs a refill.   Last Visit: 10/12/2019 Next Visit: 03/14/2020 Labs: 10/14/2019 CBC and CMP WNL Eye exam: 04/15/19 WNL  Current Dose per office note 10/12/2019: Plaquenil 200 mg 1 tablet by mouth twice daily DX: Other systemic lupus erythematosus with other organ involvement   Okay to refill per Dr. Corliss Skains

## 2020-01-18 ENCOUNTER — Other Ambulatory Visit: Payer: 59

## 2020-01-19 ENCOUNTER — Other Ambulatory Visit: Payer: 59

## 2020-01-19 DIAGNOSIS — Z20822 Contact with and (suspected) exposure to covid-19: Secondary | ICD-10-CM

## 2020-01-20 LAB — SARS-COV-2, NAA 2 DAY TAT

## 2020-01-20 LAB — NOVEL CORONAVIRUS, NAA: SARS-CoV-2, NAA: NOT DETECTED

## 2020-01-23 ENCOUNTER — Ambulatory Visit: Payer: 59 | Admitting: Certified Nurse Midwife

## 2020-02-20 ENCOUNTER — Other Ambulatory Visit: Payer: Self-pay | Admitting: Rheumatology

## 2020-02-20 DIAGNOSIS — M3219 Other organ or system involvement in systemic lupus erythematosus: Secondary | ICD-10-CM

## 2020-02-20 NOTE — Telephone Encounter (Signed)
Last Visit: 10/12/2019 Next Visit: 03/14/2020 Labs: 10/14/2019 CBC and CMP WNL Eye exam: 04/15/19 WNL  Current Dose per office note 10/12/2019: Plaquenil 200 mg 1 tablet by mouth twice daily DX: Other systemic lupus erythematosus with other organ involvement   Okay to refill per Dr. Corliss Skains

## 2020-02-21 ENCOUNTER — Other Ambulatory Visit: Payer: 59

## 2020-02-21 DIAGNOSIS — Z20822 Contact with and (suspected) exposure to covid-19: Secondary | ICD-10-CM

## 2020-02-22 LAB — NOVEL CORONAVIRUS, NAA: SARS-CoV-2, NAA: NOT DETECTED

## 2020-02-22 LAB — SARS-COV-2, NAA 2 DAY TAT

## 2020-02-29 NOTE — Progress Notes (Deleted)
Office Visit Note  Patient: Tammy Giles             Date of Birth: 10-29-1985           MRN: [id number]             PCP: Irena Reichmann, DO Referring: Irena Reichmann, DO Visit Date: 03/14/2020 Occupation: @GUAROCC @  Subjective:    History of Present Illness: Christne Platts is a 35 y.o. female with history of systemic lupus erythematosus.  Patient is taking Plaquenil 200 mg 1 tablet by mouth twice daily.  Activities of Daily Living:  Patient reports morning stiffness for *** {minute/hour:19697}.   Patient {ACTIONS;DENIES/REPORTS:21021675::"Denies"} nocturnal pain.  Difficulty dressing/grooming: {ACTIONS;DENIES/REPORTS:21021675::"Denies"} Difficulty climbing stairs: {ACTIONS;DENIES/REPORTS:21021675::"Denies"} Difficulty getting out of chair: {ACTIONS;DENIES/REPORTS:21021675::"Denies"} Difficulty using hands for taps, buttons, cutlery, and/or writing: {ACTIONS;DENIES/REPORTS:21021675::"Denies"}  Review of Systems  Constitutional: Negative for fatigue.  HENT: Negative for mouth sores, mouth dryness and nose dryness.   Eyes: Negative for pain, visual disturbance and dryness.  Respiratory: Negative for cough, hemoptysis, shortness of breath and difficulty breathing.   Cardiovascular: Negative for chest pain, palpitations, hypertension and swelling in legs/feet.  Gastrointestinal: Negative for blood in stool, constipation and diarrhea.  Endocrine: Negative for increased urination.  Genitourinary: Negative for painful urination.  Musculoskeletal: Negative for arthralgias, joint pain, joint swelling, myalgias, muscle weakness, morning stiffness, muscle tenderness and myalgias.  Skin: Negative for color change, pallor, rash, hair loss, nodules/bumps, skin tightness, ulcers and sensitivity to sunlight.  Allergic/Immunologic: Negative for susceptible to infections.  Neurological: Negative for dizziness, numbness, headaches and weakness.  Hematological: Negative for swollen glands.   Psychiatric/Behavioral: Negative for depressed mood and sleep disturbance. The patient is not nervous/anxious.     PMFS History:  Patient Active Problem List   Diagnosis Date Noted  . Class 2 obesity without serious comorbidity with body mass index (BMI) of 35.0 to 35.9 in adult 02/13/2017  . Raynaud's syndrome without gangrene 02/13/2017  . High risk medication use 02/13/2017  . Dyslipidemia 12/12/2015  . Vitamin D deficiency 12/12/2015  . Annual physical exam 12/11/2015  . Migraine without aura and with status migrainosus, not intractable 12/11/2015  . Obesity (BMI 35.0-39.9 without comorbidity) 12/11/2015  . Encounter for long-term (current) use of high-risk medication 04/26/2014  . Abnormal Pap smear of cervix 06/16/2013    Class: History of  . Lupus (systemic lupus erythematosus) (HCC) 10/22/2011    Past Medical History:  Diagnosis Date  . Abnormal Pap smear of cervix    LGSIL 2014 & 2015, ASCUS HPV HR+ 2016, ASCUS HPV HR+ 2017, 12-26-16 LGSIL  . 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 Irregular periods    has IUD  . Lupus (HCC)   . Migraines   . STD (sexually transmitted disease)    HSV2    Family History  Problem Relation Age of Onset  . Hypertension Mother   . Hypertension Father   . Depression Father   . Prostate cancer Father   . Heart disease Father   . Thyroid disease Sister    Past Surgical History:  Procedure Laterality Date  . ACHILLES TENDON SURGERY Left 05/24/2014   Procedure: LEFT ACHILLES TENDON REPAIR;  Surgeon: 07/24/2014, MD;  Location: [facility];  Service: Orthopedics;  Laterality: Left;  . COLPOSCOPY     2014, 2015, 2016, 2017 LGSIL, 2018  . INTRAUTERINE DEVICE (IUD) INSERTION     insertion 07-18-16  . WISDOM TOOTH EXTRACTION     Social History   Social History  Narrative  . Not on file   Immunization History  Administered Date(s) Administered  . Influenza Split 11/20/2014  . Influenza,inj,Quad PF,6+ Mos 12/11/2015  . PFIZER  SARS-COV-2 Vaccination 07/17/2019, 08/07/2019  . Tdap 02/18/2007, 12/26/2016     Objective: Vital Signs: There were no vitals taken for this visit.   Physical Exam Vitals and nursing note reviewed.  Constitutional:      Appearance: She is well-developed and well-nourished.  HENT:     Head: Normocephalic and atraumatic.  Eyes:     Extraocular Movements: EOM normal.     Conjunctiva/sclera: Conjunctivae normal.  Cardiovascular:     Pulses: Intact distal pulses.  Pulmonary:     Effort: Pulmonary effort is normal.  Abdominal:     Palpations: Abdomen is soft.  Musculoskeletal:     Cervical back: Normal range of motion.  Skin:    General: Skin is warm and dry.     Capillary Refill: Capillary refill takes less than 2 seconds.  Neurological:     Mental Status: She is alert and oriented to person, place, and time.  Psychiatric:        Mood and Affect: Mood and affect normal.        Behavior: Behavior normal.      Musculoskeletal Exam: ***  CDAI Exam: CDAI Score: -- Patient Global: --; Provider Global: -- Swollen: --; Tender: -- Joint Exam 03/14/2020   No joint exam has been documented for this visit   There is currently no information documented on the homunculus. Go to the Rheumatology activity and complete the homunculus joint exam.  Investigation: No additional findings.  Imaging: No results found.  Recent Labs: Lab Results  Component Value Date   WBC 4.9 10/14/2019   HGB 12.7 10/14/2019   PLT 173 10/14/2019   NA 137 10/14/2019   K 4.3 10/14/2019   CL 107 10/14/2019   CO2 24 10/14/2019   GLUCOSE 71 10/14/2019   BUN 13 10/14/2019   CREATININE 0.94 10/14/2019   BILITOT 0.3 10/14/2019   AST 14 10/14/2019   ALT 11 10/14/2019   PROT 7.5 10/14/2019   CALCIUM 9.1 10/14/2019   GFRAA 93 10/14/2019    Speciality Comments: PLQ Eye Exam: 04/15/19 WNL @ [facility] Follow up in 1 year  Procedures:  No procedures performed Allergies: Amoxicillin and Other    Assessment / Plan:     Visit Diagnoses: No diagnosis found.  Orders: No orders of the defined types were placed in this encounter.  No orders of the defined types were placed in this encounter.   Face-to-face time spent with patient was *** minutes. Greater than 50% of time was spent in counseling and coordination of care.  Follow-Up Instructions: No follow-ups on file.   Ellen Henri, CMA  Note - This record has been created using Animal nutritionist.  Chart creation errors have been sought, but may not always  have been located. Such creation errors do not reflect on  the standard of medical care.

## 2020-03-12 ENCOUNTER — Other Ambulatory Visit: Payer: Self-pay

## 2020-03-12 ENCOUNTER — Other Ambulatory Visit: Payer: 59

## 2020-03-12 DIAGNOSIS — Z20822 Contact with and (suspected) exposure to covid-19: Secondary | ICD-10-CM

## 2020-03-13 ENCOUNTER — Telehealth: Payer: Self-pay

## 2020-03-13 LAB — NOVEL CORONAVIRUS, NAA: SARS-CoV-2, NAA: DETECTED — AB

## 2020-03-13 LAB — SARS-COV-2, NAA 2 DAY TAT

## 2020-03-13 NOTE — Telephone Encounter (Addendum)
For post Covid exposure:  Date of Symptom Onset: no symptoms. [place] from [place]. Patient states she took the test since she had travelled. Positive Covid test on 03/12/2020 Medical Conditions: Other systemic lupus erythematosus with other organ involvement Is patient receiving IV therapy for chronic conditions? No  Has patient had an organ transplant or bone marrow transplant in the last 6 month? No  Vaccination Status: Yes  Name of Vaccine Received: Pfizer  Number of doses: no    Patient advised she does not have to hold her PLQ.   Please advise if patient should have infusion referral placed since patient is asymptomatic. Patient would also like to know if she can change her visit to a virtual.

## 2020-03-13 NOTE — Telephone Encounter (Signed)
Patient called stating she tested positive for COVID.  Patient is scheduled for an appointment with Ladona Ridgel tomorrow 03/14/20 at 8:20 am.  Patient is requesting a return call to let her know if she needs to schedule a virtual appointment or if she can reschedule once she has no more symptoms.

## 2020-03-14 ENCOUNTER — Ambulatory Visit: Payer: 59 | Admitting: Physician Assistant

## 2020-03-14 DIAGNOSIS — Z8719 Personal history of other diseases of the digestive system: Secondary | ICD-10-CM

## 2020-03-14 DIAGNOSIS — R002 Palpitations: Secondary | ICD-10-CM

## 2020-03-14 DIAGNOSIS — R5383 Other fatigue: Secondary | ICD-10-CM

## 2020-03-14 DIAGNOSIS — G43001 Migraine without aura, not intractable, with status migrainosus: Secondary | ICD-10-CM

## 2020-03-14 DIAGNOSIS — Z79899 Other long term (current) drug therapy: Secondary | ICD-10-CM

## 2020-03-14 DIAGNOSIS — E559 Vitamin D deficiency, unspecified: Secondary | ICD-10-CM

## 2020-03-14 DIAGNOSIS — R2231 Localized swelling, mass and lump, right upper limb: Secondary | ICD-10-CM

## 2020-03-14 DIAGNOSIS — E785 Hyperlipidemia, unspecified: Secondary | ICD-10-CM

## 2020-03-14 DIAGNOSIS — I7300 Raynaud's syndrome without gangrene: Secondary | ICD-10-CM

## 2020-03-14 DIAGNOSIS — M3219 Other organ or system involvement in systemic lupus erythematosus: Secondary | ICD-10-CM

## 2020-03-14 NOTE — Telephone Encounter (Signed)
Monoclonal antibody infusion is indicated for mild to moderate symptoms.  If patient is completely asymptomatic then she does not need infusion.  Please make her aware that the monoclonal antibody infusion is only beneficial in the first 5 days after the onset of symptoms.  If she has mild symptoms then we can place an antibody infusion referral.

## 2020-03-14 NOTE — Telephone Encounter (Signed)
Patient advised Monoclonal antibody infusion is indicated for mild to moderate symptoms.  Patient advised since she is completely asymptomatic then she does not need infusion.  Patient advised that the monoclonal antibody infusion is only beneficial in the first 5 days after the onset of symptoms.  Patient advised if she has mild symptoms then we can place an antibody infusion referral. Patient expressed understanding and states she still has no symptoms.

## 2020-03-16 ENCOUNTER — Other Ambulatory Visit: Payer: 59

## 2020-04-04 NOTE — Progress Notes (Signed)
Office Visit Note  Patient: Tammy Giles             Date of Birth: 03-May-1985           MRN: [id number]             PCP: Tammy Morning, DO Referring: Tammy Morning, DO Visit Date: 04/18/2020 Occupation: _0 @  Subjective:  Arthralgias   History of Present Illness: Tammy Giles is a 35 y.o. female with history of systemic lupus erythematosus.  She is currently taking plaquenil 200 mg 1 tablet by mouth twice daily.  She is tolerating PLQ without any side effects.  She denies any signs or symptoms of a lupus flare.  She continues to experience intermittent arthralgias and joint stiffness.  She had swelling in her left ankle joint recently after exercising on the elliptical, which resolved the following day.  She has intermittent discomfort in the left shoulder joint and ongoing bursitis over both hips, which is exacerbated by laying on her sides at night.  She continues to have intermittent pain and swelling in both wrist joints which is typically exacerbated by activity.  She takes Aleve on a daily basis for management of joint pain and stiffness.  She has a few patches of scattered's eczema on her torso and left foot.  She has not had any recent facial rashes.  She denies any symptoms of Raynaud's.  She notices some sores in her ears intermittently but denies any oral or nasal ulcerations.  She has ongoing sicca symptoms which have been tolerable overall.  She denies any swollen lymph nodes.  She denies any shortness of breath, palpitations, or pleuritic chest pain. She has noticed some increased constipation recently but denies any blood in her stool.    Activities of Daily Living:  Patient reports Giles stiffness for 0 minutes.   Patient Denies nocturnal pain.  Difficulty dressing/grooming: Denies Difficulty climbing stairs: Denies Difficulty getting out of chair: Denies Difficulty using hands for taps, buttons, cutlery, and/or writing: Denies  Review of Systems  Constitutional:  Positive for fatigue.  HENT: Negative for mouth sores, mouth dryness and nose dryness.        Ear sores   Eyes: Negative for pain, itching and dryness.  Respiratory: Negative for shortness of breath and difficulty breathing.   Cardiovascular: Negative for chest pain and palpitations.  Gastrointestinal: Negative for blood in stool, constipation and diarrhea.  Endocrine: Negative for increased urination.  Genitourinary: Negative for difficulty urinating.  Musculoskeletal: Positive for arthralgias, joint pain and joint swelling. Negative for myalgias, Giles stiffness, muscle tenderness and myalgias.  Skin: Positive for rash. Negative for color change.  Allergic/Immunologic: Negative for susceptible to infections.  Neurological: Positive for numbness. Negative for dizziness, headaches, memory loss and weakness.  Hematological: Positive for bruising/bleeding tendency.  Psychiatric/Behavioral: Negative for confusion and sleep disturbance.    PMFS History:  Patient Active Problem List   Diagnosis Date Noted  . Class 2 obesity without serious comorbidity with body mass index (BMI) of 35.0 to 35.9 in adult 02/13/2017  . Raynaud's syndrome without gangrene 02/13/2017  . High risk medication use 02/13/2017  . Dyslipidemia 12/12/2015  . Vitamin D deficiency 12/12/2015  . Annual physical exam 12/11/2015  . Migraine without aura and with status migrainosus, not intractable 12/11/2015  . Obesity (BMI 35.0-39.9 without comorbidity) 12/11/2015  . Encounter for long-term (current) use of high-risk medication 04/26/2014  . Abnormal Pap smear of cervix 06/16/2013    Class: History of  . Lupus (systemic  lupus erythematosus) ([place]) 10/22/2011    Past Medical History:  Diagnosis Date  . Abnormal Pap smear of cervix    LGSIL 2014 & 2015, ASCUS HPV HR+ 2016, ASCUS HPV HR+ 2017, 12-26-16 LGSIL  . 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 Irregular periods    has IUD  . Lupus ([place])   . Migraines   . STD  (sexually transmitted disease)    HSV2    Family History  Problem Relation Age of Onset  . Hypertension Mother   . Hypertension Father   . Depression Father   . Prostate cancer Father   . Heart disease Father   . Thyroid disease Sister    Past Surgical History:  Procedure Laterality Date  . ACHILLES TENDON SURGERY Left 05/24/2014   Procedure: LEFT ACHILLES TENDON REPAIR;  Surgeon: Leandrew Koyanagi, MD;  Location: [facility];  Service: Orthopedics;  Laterality: Left;  . COLPOSCOPY     2014, 2015, 2016, 2017 LGSIL, 2018  . INTRAUTERINE DEVICE (IUD) INSERTION     insertion 07-18-16  . WISDOM TOOTH EXTRACTION     Social History   Social History Narrative  . Not on file   Immunization History  Administered Date(s) Administered  . Influenza Split 11/20/2014  . Influenza,inj,Quad PF,6+ Mos 12/11/2015  . PFIZER(Purple Top)SARS-COV-2 Vaccination 07/17/2019, 08/07/2019, 01/11/2020  . Tdap 02/18/2007, 12/26/2016     Objective: Vital Signs: BP 128/79 (BP Location: Left Arm, Patient Position: Sitting, Cuff Size: Large)   Pulse 87   Resp 14   Ht 5' 5" (1.651 m)   Wt 248 lb (112.5 kg)   BMI 41.27 kg/m    Physical Exam Vitals and nursing note reviewed.  Constitutional:      Appearance: She is well-developed and well-nourished.  HENT:     Head: Normocephalic and atraumatic.     Mouth/Throat:     Comments: No parotid swelling or tenderness  Eyes:     Extraocular Movements: EOM normal.     Conjunctiva/sclera: Conjunctivae normal.  Cardiovascular:     Pulses: Intact distal pulses.  Pulmonary:     Effort: Pulmonary effort is normal.  Abdominal:     Palpations: Abdomen is soft.     Tenderness: There is no abdominal tenderness.  Musculoskeletal:     Cervical back: Normal range of motion.  Lymphadenopathy:     Cervical: No cervical adenopathy.  Skin:    General: Skin is warm and dry.     Capillary Refill: Capillary refill takes less than 2 seconds.     Comments: No  digital ulcerations or signs of gangrene.   No malar rash noted.   Neurological:     Mental Status: She is alert and oriented to person, place, and time.  Psychiatric:        Mood and Affect: Mood and affect normal.        Behavior: Behavior normal.      Musculoskeletal Exam: C-spine, thoracic spine, and lumbar spine good ROM.  Shoulder joints, elbow joints, wrist joints, MCPs, PIPs, and DIPs good ROM with no synovitis.  Tenderness over both wrist joints.  Complete fist formation bilaterally.  Hip joints, knee joints, and ankle joints good ROM with no tenderness or discomfort.  No warmth or effusion of knee joints.  No tenderness or swelling of ankle joints.  No tenderness over MTP joints.   CDAI Exam: CDAI Score: -- Patient Global: --; Provider Global: -- Swollen: 0 ; Tender: 2  Joint Exam 04/18/2020  Right  Left  Wrist   Tender   Tender   There is currently no information documented on the homunculus. Go to the Rheumatology activity and complete the homunculus joint exam.  Investigation: No additional findings.  Imaging: No results found.  Recent Labs: Lab Results  Component Value Date   WBC 4.9 10/14/2019   HGB 12.7 10/14/2019   PLT 173 10/14/2019   NA 137 10/14/2019   K 4.3 10/14/2019   CL 107 10/14/2019   CO2 24 10/14/2019   GLUCOSE 71 10/14/2019   BUN 13 10/14/2019   CREATININE 0.94 10/14/2019   BILITOT 0.3 10/14/2019   AST 14 10/14/2019   ALT 11 10/14/2019   PROT 7.5 10/14/2019   CALCIUM 9.1 10/14/2019   GFRAA 93 10/14/2019    Speciality Comments: PLQ Eye Exam: 04/15/19 WNL @ [facility] Follow up in 1 year  Procedures:  No procedures performed Allergies: Amoxicillin and Other   Assessment / Plan:     Visit Diagnoses: Other systemic lupus erythematosus with other organ involvement (HCC) - ANA >1:1280, + Smith, + RNP, + SSA:  She has not had any signs or symptoms of a systemic lupus flare recently.  She has clinically been doing well taking  plaquenil 200 mg 1 tablet by mouth twice daily.  She is tolerating plaquenil without any side effects and has been compliant taking the medication daily.  She has been experiencing intermittent arthralgias and joint stiffness in multiple joints including left shoulder, both wrist joints, and the left ankle joint.  She has tenderness over both wrist joints on exam today but no synovitis was noted. She has been taking aleve at bedtime for pain relief.  She has not had any symptoms of raynaud's recently and no digital ulcerations or signs of gangrene were noted.   No malar rash on exam. Discussed the importance of avoiding direct sun exposure and to wear sunscreen on a daily basis. No cervical lymphadenopathy was palpable.  Lab work from 10/14/2019 was reviewed with the patient today in the office: Complements within normal limits, double-stranded DNA negative, ESR within normal limits, UA normal, RF negative, anti-CCP negative, _0 eta negative, vitamin D 30, CBC within normal limits, and CMP within normal limits.  We will repeat the following lab work today.  She will continue taking Plaquenil as prescribed.  She was advised to notify us if she develops any signs or symptoms of a flare.  She will follow-up in the office 5 months. - Plan: CBC with Differential/Platelet, COMPLETE METABOLIC PANEL WITH GFR, Urinalysis, Routine w reflex microscopic, Anti-DNA antibody, double-stranded, C3 and C4, Sedimentation rate  According to the patient her and her husband plan to start trying to get pregnant within the next 6 to 9 months.  We discussed that ideally her systemic lupus should be well controlled 6 months prior to conceiving.  Discussed that her lab work on 10/14/2019 is not consistent with active lupus.  We discussed that Plaquenil is safe during pregnancy and while breast-feeding.  She was advised to further discuss planning pregnancy with her OB/GYN for further recommendations. All questions were addressed.   High  risk medication use - Plaquenil 200 mg 1 tablet by mouth twice daily. PLQ Eye Exam: 04/15/19 WNL @ [facility] Follow up in 1 year.  She is scheduled for an updated plaquenil eye exam on Friday.  She was given a PLQ eye exam form to take with her to her upcoming appointment.  - Plan:  CBC with Differential/Platelet, COMPLETE METABOLIC PANEL WITH GFR She has not had any recent infections.  She has received 3 pfizer covid-19 vaccine doses.   Raynaud's syndrome without gangrene: Not currently active.  No digital ulcerations or signs of gangrene.   Palpitations: She has not been experiencing any palpitations, shortness of breath, or pleuritic chest pain.   Other fatigue: Stable.  She has been exercising on a regular basis.   Vitamin D deficiency: She is taking vitamin D 2,000 units daily.   Other medical conditions are listed as follows:   Migraine   Dyslipidemia  History of gastroesophageal reflux (GERD)  Orders: Orders Placed This Encounter  Procedures  . CBC with Differential/Platelet  . COMPLETE METABOLIC PANEL WITH GFR  . Urinalysis, Routine w reflex microscopic  . Anti-DNA antibody, double-stranded  . C3 and C4  . Sedimentation rate   No orders of the defined types were placed in this encounter.    Follow-Up Instructions: Return in about 5 months (around 09/18/2020) for Systemic lupus erythematosus.   Ofilia Neas, PA-C  Note - This record has been created using Dragon software.  Chart creation errors have been sought, but may not always  have been located. Such creation errors do not reflect on  the standard of medical care.

## 2020-04-18 ENCOUNTER — Ambulatory Visit: Payer: 59 | Admitting: Physician Assistant

## 2020-04-18 ENCOUNTER — Encounter: Payer: Self-pay | Admitting: Physician Assistant

## 2020-04-18 ENCOUNTER — Other Ambulatory Visit: Payer: Self-pay

## 2020-04-18 VITALS — BP 128/79 | HR 87 | Resp 14 | Ht 65.0 in | Wt 248.0 lb

## 2020-04-18 DIAGNOSIS — E785 Hyperlipidemia, unspecified: Secondary | ICD-10-CM

## 2020-04-18 DIAGNOSIS — R5383 Other fatigue: Secondary | ICD-10-CM

## 2020-04-18 DIAGNOSIS — Z8719 Personal history of other diseases of the digestive system: Secondary | ICD-10-CM

## 2020-04-18 DIAGNOSIS — M3219 Other organ or system involvement in systemic lupus erythematosus: Secondary | ICD-10-CM | POA: Diagnosis not present

## 2020-04-18 DIAGNOSIS — I7300 Raynaud's syndrome without gangrene: Secondary | ICD-10-CM | POA: Diagnosis not present

## 2020-04-18 DIAGNOSIS — Z79899 Other long term (current) drug therapy: Secondary | ICD-10-CM | POA: Diagnosis not present

## 2020-04-18 DIAGNOSIS — E559 Vitamin D deficiency, unspecified: Secondary | ICD-10-CM

## 2020-04-18 DIAGNOSIS — R002 Palpitations: Secondary | ICD-10-CM | POA: Diagnosis not present

## 2020-04-18 DIAGNOSIS — G43001 Migraine without aura, not intractable, with status migrainosus: Secondary | ICD-10-CM

## 2020-04-19 LAB — COMPLETE METABOLIC PANEL WITH GFR
AG Ratio: 1.3 (calc) (ref 1.0–2.5)
ALT: 12 U/L (ref 6–29)
AST: 17 U/L (ref 10–30)
Albumin: 4.2 g/dL (ref 3.6–5.1)
Alkaline phosphatase (APISO): 55 U/L (ref 31–125)
BUN: 19 mg/dL (ref 7–25)
CO2: 24 mmol/L (ref 20–32)
Calcium: 9.3 mg/dL (ref 8.6–10.2)
Chloride: 104 mmol/L (ref 98–110)
Creat: 0.91 mg/dL (ref 0.50–1.10)
GFR, Est African American: 97 mL/min/{1.73_m2} (ref >=60)
GFR, Est Non African American: 83 mL/min/{1.73_m2} (ref >=60)
Globulin: 3.2 g/dL (calc) (ref 1.9–3.7)
Glucose, Bld: 75 mg/dL (ref 65–99)
Potassium: 4 mmol/L (ref 3.5–5.3)
Sodium: 135 mmol/L (ref 135–146)
Total Bilirubin: 0.6 mg/dL (ref 0.2–1.2)
Total Protein: 7.4 g/dL (ref 6.1–8.1)

## 2020-04-19 LAB — CBC WITH DIFFERENTIAL/PLATELET
Absolute Monocytes: 684 cells/uL (ref 200–950)
Basophils Absolute: 21 cells/uL (ref 0–200)
Basophils Relative: 0.4 %
Eosinophils Absolute: 80 cells/uL (ref 15–500)
Eosinophils Relative: 1.5 %
HCT: 40 % (ref 35.0–45.0)
Hemoglobin: 13.4 g/dL (ref 11.7–15.5)
Lymphs Abs: 1039 cells/uL (ref 850–3900)
MCH: 32.8 pg (ref 27.0–33.0)
MCHC: 33.5 g/dL (ref 32.0–36.0)
MCV: 97.8 fL (ref 80.0–100.0)
MPV: 12.1 fL (ref 7.5–12.5)
Monocytes Relative: 12.9 %
Neutro Abs: 3477 cells/uL (ref 1500–7800)
Neutrophils Relative %: 65.6 %
Platelets: 184 10*3/uL (ref 140–400)
RBC: 4.09 10*6/uL (ref 3.80–5.10)
RDW: 12.3 % (ref 11.0–15.0)
Total Lymphocyte: 19.6 %
WBC: 5.3 10*3/uL (ref 3.8–10.8)

## 2020-04-19 LAB — URINALYSIS, ROUTINE W REFLEX MICROSCOPIC
Bilirubin Urine: NEGATIVE
Glucose, UA: NEGATIVE
Hgb urine dipstick: NEGATIVE
Ketones, ur: NEGATIVE
Leukocytes,Ua: NEGATIVE
Nitrite: NEGATIVE
Protein, ur: NEGATIVE
Specific Gravity, Urine: 1.023 (ref 1.001–1.03)
pH: 6.5 (ref 5.0–8.0)

## 2020-04-19 LAB — C3 AND C4
C3 Complement: 112 mg/dL (ref 83–193)
C4 Complement: 18 mg/dL (ref 15–57)

## 2020-04-19 LAB — SEDIMENTATION RATE: Sed Rate: 11 mm/h (ref 0–20)

## 2020-04-19 LAB — ANTI-DNA ANTIBODY, DOUBLE-STRANDED: ds DNA Ab: 2 IU/mL

## 2020-04-19 NOTE — Progress Notes (Signed)
dsDNA negative.

## 2020-04-19 NOTE — Progress Notes (Signed)
CBC and CMP WNL.  ESR and complements WNL.  UA normal.

## 2020-06-01 ENCOUNTER — Other Ambulatory Visit: Payer: Self-pay | Admitting: Rheumatology

## 2020-06-01 DIAGNOSIS — M3219 Other organ or system involvement in systemic lupus erythematosus: Secondary | ICD-10-CM

## 2020-06-04 NOTE — Telephone Encounter (Signed)
Last Visit: 04/18/2020 Next Visit:09/19/2020 Labs: 04/18/2020 CBC and CMP WNL. ESR and complements WNL. UA normal.  Eye exam: 04/15/2019   Current Dose per office note on 04/18/2020: Plaquenil 200 mg 1 tablet by mouth twice daily.  TZ:GYFVC systemic lupus erythematosus with other organ involvement  Last Fill: 02/20/2020  Advised patient we need updated PLQ eye exam, patient states it was updated in March and she will call to have results faxed to our office.   Okay to refill Plaquenil?

## 2020-09-03 ENCOUNTER — Other Ambulatory Visit: Payer: Self-pay | Admitting: *Deleted

## 2020-09-03 DIAGNOSIS — M3219 Other organ or system involvement in systemic lupus erythematosus: Secondary | ICD-10-CM

## 2020-09-03 MED ORDER — HYDROXYCHLOROQUINE SULFATE 200 MG PO TABS: ORAL_TABLET | ORAL | 0 refills | Status: DC

## 2020-09-03 NOTE — Telephone Encounter (Signed)
Refill request received via fax  Last Visit: 04/18/2020  Next Visit: 09/19/2020  Labs: 04/18/2020 CBC and CMP WNL.  ESR and complements WNL.  UA normal.   Eye exam: 04/20/2020 WNL   Current Dose per office note 04/18/2020: Plaquenil 200 mg 1 tablet by mouth twice daily  LG:[id number] systemic lupus erythematosus with other organ involvement  Last Fill: 06/04/2020  Per protocol, okay to refill per Dr. Estanislado Pandy

## 2020-09-05 NOTE — Progress Notes (Signed)
Office Visit Note  Patient: Tammy Giles             Date of Birth: [birth year]           MRN: [id number]             PCP: Irena Reichmann, DO Referring: Irena Reichmann, DO Visit Date: 09/19/2020 Occupation: @GUAROCC @  Subjective:  Lupus (Doing good)   History of Present Illness: Tammy Giles is a 35 y.o. female with a history of systemic lupus dermatosis.  She gives history of dry mouth, occasional rashes and photosensitivity.  She states that she has been having discomfort in her right shoulder right elbow and right wrist joint off and on.  She is currently having discomfort in her right wrist.  She recently came back from 34 and developed some photosensitivity.  She has been taking hydroxychloroquine 2 tablets at bedtime.  She denies any shortness of breath or palpitations.  Activities of Daily Living:  Patient reports morning stiffness for 0  none .   Patient Reports nocturnal pain.  Difficulty dressing/grooming: Denies Difficulty climbing stairs: Denies Difficulty getting out of chair: Denies Difficulty using hands for taps, buttons, cutlery, and/or writing: Denies  Review of Systems  Constitutional:  Positive for fatigue.  HENT:  Positive for mouth dryness.   Eyes:  Negative for dryness.  Respiratory:  Negative for shortness of breath.   Cardiovascular:  Negative for swelling in legs/feet.  Gastrointestinal:  Positive for constipation.  Endocrine: Positive for cold intolerance, heat intolerance and excessive thirst.  Genitourinary:  Negative for difficulty urinating.  Musculoskeletal:  Positive for joint pain, joint pain and joint swelling.  Skin:  Positive for rash.  Allergic/Immunologic: Negative for susceptible to infections.  Neurological:  Positive for numbness.  Hematological:  Positive for bruising/bleeding tendency.  Psychiatric/Behavioral:  Positive for sleep disturbance.    PMFS History:  Patient Active Problem List   Diagnosis Date Noted   Class 2  obesity without serious comorbidity with body mass index (BMI) of 35.0 to 35.9 in adult 02/13/2017   Raynaud's syndrome without gangrene 02/13/2017   High risk medication use 02/13/2017   Dyslipidemia 12/12/2015   Vitamin D deficiency 12/12/2015   Annual physical exam 12/11/2015   Migraine without aura and with status migrainosus, not intractable 12/11/2015   Obesity (BMI 35.0-39.9 without comorbidity) 12/11/2015   Encounter for long-term (current) use of high-risk medication 04/26/2014   Abnormal Pap smear of cervix 06/16/2013    Class: History of   Lupus (systemic lupus erythematosus) (HCC) 10/22/2011    Past Medical History:  Diagnosis Date   Abnormal Pap smear of cervix    LGSIL 2014 & 2015, ASCUS HPV HR+ 2016, ASCUS HPV HR+ 2017, 12-26-16 LGSIL   Achilles rupture, left 05/13/2014   Irregular periods    has IUD   Lupus (HCC)    Migraines    STD (sexually transmitted disease)    HSV2    Family History  Problem Relation Age of Onset   Hypertension Mother    Hypertension Father    Depression Father    Prostate cancer Father    Heart disease Father    Thyroid disease Sister    Past Surgical History:  Procedure Laterality Date   ACHILLES TENDON SURGERY Left 05/24/2014   Procedure: LEFT ACHILLES TENDON REPAIR;  Surgeon: 07/24/2014, MD;  Location: [facility];  Service: Orthopedics;  Laterality: Left;   COLPOSCOPY     2014, 2015, 2016, 2017 LGSIL, 2018  INTRAUTERINE DEVICE (IUD) INSERTION     insertion 07-18-16   WISDOM TOOTH EXTRACTION     Social History   Social History Narrative   Not on file   Immunization History  Administered Date(s) Administered   Influenza Split 11/20/2014   Influenza,inj,Quad PF,6+ Mos 12/11/2015   PFIZER(Purple Top)SARS-COV-2 Vaccination 07/17/2019, 08/07/2019, 01/11/2020   Tdap 02/18/2007, 12/26/2016     Objective: Vital Signs: BP 105/73 (BP Location: Left Arm, Patient Position: Sitting, Cuff Size: Normal)   Pulse 70    Resp 14   Ht 5\' 5"  (1.651 m)   Wt 249 lb (112.9 kg)   BMI 41.44 kg/m    Physical Exam Vitals and nursing note reviewed.  Constitutional:      Appearance: She is well-developed.  HENT:     Head: Normocephalic and atraumatic.  Eyes:     Conjunctiva/sclera: Conjunctivae normal.  Cardiovascular:     Rate and Rhythm: Normal rate and regular rhythm.     Heart sounds: Normal heart sounds.  Pulmonary:     Effort: Pulmonary effort is normal.     Breath sounds: Normal breath sounds.  Abdominal:     General: Bowel sounds are normal.     Palpations: Abdomen is soft.  Musculoskeletal:     Cervical back: Normal range of motion.  Lymphadenopathy:     Cervical: No cervical adenopathy.  Skin:    General: Skin is warm and dry.     Capillary Refill: Capillary refill takes less than 2 seconds.  Neurological:     Mental Status: She is alert and oriented to person, place, and time.  Psychiatric:        Behavior: Behavior normal.     Musculoskeletal Exam: C-spine with good range of motion.  Shoulder joints, elbow joints, wrist joints, MCPs PIPs and DIPs with good range of motion with no synovitis.  She has some tenderness on palpation of her right wrist joint.  Hip joints, knee joints, ankles, MTPs and PIPs with good range of motion with no synovitis.  CDAI Exam: CDAI Score: -- Patient Global: --; Provider Global: -- Swollen: --; Tender: -- Joint Exam 09/19/2020   No joint exam has been documented for this visit   There is currently no information documented on the homunculus. Go to the Rheumatology activity and complete the homunculus joint exam.  Investigation: No additional findings.  Imaging: No results found.  Recent Labs: Lab Results  Component Value Date   WBC 5.3 04/18/2020   HGB 13.4 04/18/2020   PLT 184 04/18/2020   NA 135 04/18/2020   K 4.0 04/18/2020   CL 104 04/18/2020   CO2 24 04/18/2020   GLUCOSE 75 04/18/2020   BUN 19 04/18/2020   CREATININE 0.91 04/18/2020    BILITOT 0.6 04/18/2020   AST 17 04/18/2020   ALT 12 04/18/2020   PROT 7.4 04/18/2020   CALCIUM 9.3 04/18/2020   GFRAA 97 04/18/2020    Speciality Comments: PLQ Eye Exam: 04/20/2020 WNL @ [facility] Follow up in 9 months  Procedures:  No procedures performed Allergies: Amoxicillin and Other   Assessment / Plan:     Visit Diagnoses: Other systemic lupus erythematosus with other organ involvement (HCC) - ANA >1:1280, + Smith, + RNP, + SSA:dry mouth , occasional rashes , photosensitivity.  She states her Raynaud's phenomenon is not active currently due to warmer weather.  She complains of occasional joint discomfort mostly in her right shoulder right elbow and right wrist.  She has some tenderness  on palpation of her right wrist joint.  No synovitis was noted.  I will obtain labs today.- Plan: Urinalysis, Routine w reflex microscopic, Anti-DNA antibody, double-stranded, C3 and C4, Sedimentation rate  High risk medication use - Plaquenil 200 mg 1 tablet by mouth twice daily. PLQ Eye Exam: 04/20/2020 - Plan: CBC with Differential/Platelet, COMPLETE METABOLIC PANEL WITH GFR, Hydroxychloroquine, Blood.  Instructions regarding all the immunization were placed in the AVS.  Pain in right wrist-patient complains of pain and swelling in her right wrist.  No synovitis was noted.  We will observe for now.  Plan x-rays at follow-up visit.  She does not want to change treatment plan.  She has been taking hydroxychloroquine 2 tablets at bedtime.  We will check hydroxychloroquine level today.  Raynaud's syndrome without gangrene-currently not active.  Other fatigue-she continues to have some fatigue.  Vitamin D deficiency - Plan: VITAMIN D 25 Hydroxy (Vit-D Deficiency, Fractures)  Migraine -she is still having migraines.  History of gastroesophageal reflux (GERD)  Dyslipidemia-increased risk of heart disease with systemic lupus was discussed.  Dietary modifications and exercise was emphasized and  instructions were placed in the AVS.  Orders: Orders Placed This Encounter  Procedures   CBC with Differential/Platelet   COMPLETE METABOLIC PANEL WITH GFR   Urinalysis, Routine w reflex microscopic   Anti-DNA antibody, double-stranded   C3 and C4   Sedimentation rate   VITAMIN D 25 Hydroxy (Vit-D Deficiency, Fractures)   Hydroxychloroquine, Blood   CBC with Differential/Platelet   COMPLETE METABOLIC PANEL WITH GFR   Urinalysis, Routine w reflex microscopic   Anti-DNA antibody, double-stranded   C3 and C4   Sedimentation rate    No orders of the defined types were placed in this encounter.    Follow-Up Instructions: Return in about 5 months (around 02/19/2021) for SLE.   Pollyann Savoy, MD  Note - This record has been created using Animal nutritionist.  Chart creation errors have been sought, but may not always  have been located. Such creation errors do not reflect on  the standard of medical care.

## 2020-09-19 ENCOUNTER — Encounter: Payer: Self-pay | Admitting: Rheumatology

## 2020-09-19 ENCOUNTER — Other Ambulatory Visit: Payer: Self-pay

## 2020-09-19 ENCOUNTER — Ambulatory Visit: Payer: 59 | Admitting: Rheumatology

## 2020-09-19 VITALS — BP 105/73 | HR 70 | Resp 14 | Ht 65.0 in | Wt 249.0 lb

## 2020-09-19 DIAGNOSIS — M3219 Other organ or system involvement in systemic lupus erythematosus: Secondary | ICD-10-CM

## 2020-09-19 DIAGNOSIS — E785 Hyperlipidemia, unspecified: Secondary | ICD-10-CM

## 2020-09-19 DIAGNOSIS — G43001 Migraine without aura, not intractable, with status migrainosus: Secondary | ICD-10-CM

## 2020-09-19 DIAGNOSIS — I7300 Raynaud's syndrome without gangrene: Secondary | ICD-10-CM

## 2020-09-19 DIAGNOSIS — Z8719 Personal history of other diseases of the digestive system: Secondary | ICD-10-CM

## 2020-09-19 DIAGNOSIS — Z79899 Other long term (current) drug therapy: Secondary | ICD-10-CM | POA: Diagnosis not present

## 2020-09-19 DIAGNOSIS — R002 Palpitations: Secondary | ICD-10-CM

## 2020-09-19 DIAGNOSIS — R5383 Other fatigue: Secondary | ICD-10-CM | POA: Diagnosis not present

## 2020-09-19 DIAGNOSIS — M25531 Pain in right wrist: Secondary | ICD-10-CM

## 2020-09-19 DIAGNOSIS — E559 Vitamin D deficiency, unspecified: Secondary | ICD-10-CM

## 2020-09-19 NOTE — Patient Instructions (Signed)
Standing Labs We placed an order today for your standing lab work.   Please have your standing labs drawn in January and every 5 months  If possible, please have your labs drawn 2 weeks prior to your appointment so that the provider can discuss your results at your appointment.  Please note that you may see your imaging and lab results in MyChart before we have reviewed them. We may be awaiting multiple results to interpret others before contacting you. Please allow our office up to 72 hours to thoroughly review all of the results before contacting the office for clarification of your results.  We have open lab daily: Monday through Thursday from 1:30-4:30 PM and Friday from 1:30-4:00 PM at the office of Dr. Pollyann Savoy, [facility].   Please be advised, all patients with office appointments requiring lab work will take precedent over walk-in lab work.  If possible, please come for your lab work on Monday and Friday afternoons, as you may experience shorter wait times. The office is located at [address], [place], [place] [address] No appointment is necessary.   Labs are drawn by [facility]. Please bring your co-pay at the time of your lab draw.  You may receive a bill from [facility] for your lab work.  If you wish to have your labs drawn at another location, please call the office 24 hours in advance to send orders.  If you have any questions regarding directions or hours of operation,  please call [phone].   As a reminder, please drink plenty of water prior to coming for your lab work. Thanks!   Vaccines You are taking a medication(s) that can suppress your immune system.  The following immunizations are recommended: Flu annually Covid-19  Td/Tdap (tetanus, diphtheria, pertussis) every 10 years Pneumonia (Prevnar 15 then Pneumovax 23 at least 1 year apart.  Alternatively, can take Prevnar 20 without needing additional dose) Shingrix (after age 44): 2  doses from 4 weeks to 6 months apart  Please check with your PCP to make sure you are up to date.   Heart Disease Prevention   Your inflammatory disease increases your risk of heart disease which includes heart attack, stroke, atrial fibrillation (irregular heartbeats), high blood pressure, heart failure and atherosclerosis (plaque in the arteries).  It is important to reduce your risk by:   Keep blood pressure, cholesterol, and blood sugar at healthy levels   Smoking Cessation   Maintain a healthy weight  BMI 20-25   Eat a healthy diet  Plenty of fresh fruit, vegetables, and whole grains  Limit saturated fats, foods high in sodium, and added sugars  DASH and Mediterranean diet   Increase physical activity  Recommend moderate physically activity for 150 minutes per week/ 30 minutes a day for five days a week These can be broken up into three separate ten-minute sessions during the day.   Reduce Stress  Meditation, slow breathing exercises, yoga, coloring books  Dental visits twice a year

## 2020-09-26 ENCOUNTER — Telehealth: Payer: Self-pay | Admitting: *Deleted

## 2020-09-26 DIAGNOSIS — M3219 Other organ or system involvement in systemic lupus erythematosus: Secondary | ICD-10-CM

## 2020-09-26 LAB — COMPLETE METABOLIC PANEL WITH GFR
AG Ratio: 1.2 (calc) (ref 1.0–2.5)
ALT: 16 U/L (ref 6–29)
AST: 16 U/L (ref 10–30)
Albumin: 4.1 g/dL (ref 3.6–5.1)
Alkaline phosphatase (APISO): 68 U/L (ref 31–125)
BUN/Creatinine Ratio: 20 (calc) (ref 6–22)
BUN: 20 mg/dL (ref 7–25)
CO2: 27 mmol/L (ref 20–32)
Calcium: 9.2 mg/dL (ref 8.6–10.2)
Chloride: 105 mmol/L (ref 98–110)
Creat: 0.99 mg/dL — ABNORMAL HIGH (ref 0.50–0.97)
Globulin: 3.5 g/dL (calc) (ref 1.9–3.7)
Glucose, Bld: 85 mg/dL (ref 65–99)
Potassium: 4.3 mmol/L (ref 3.5–5.3)
Sodium: 139 mmol/L (ref 135–146)
Total Bilirubin: 0.4 mg/dL (ref 0.2–1.2)
Total Protein: 7.6 g/dL (ref 6.1–8.1)
eGFR: 77 mL/min/{1.73_m2} (ref >=60)

## 2020-09-26 LAB — URINALYSIS, ROUTINE W REFLEX MICROSCOPIC
Bacteria, UA: NONE SEEN /HPF
Bilirubin Urine: NEGATIVE
Glucose, UA: NEGATIVE
Hgb urine dipstick: NEGATIVE
Hyaline Cast: NONE SEEN /LPF
Ketones, ur: NEGATIVE
Leukocytes,Ua: NEGATIVE
Nitrite: NEGATIVE
RBC / HPF: NONE SEEN /HPF (ref 0–2)
Specific Gravity, Urine: 1.034 (ref 1.001–1.035)
Squamous Epithelial / HPF: NONE SEEN /HPF (ref <=5)
WBC, UA: NONE SEEN /HPF (ref 0–5)
pH: 6 (ref 5.0–8.0)

## 2020-09-26 LAB — MICROSCOPIC MESSAGE

## 2020-09-26 LAB — CBC WITH DIFFERENTIAL/PLATELET
Absolute Monocytes: 450 cells/uL (ref 200–950)
Basophils Absolute: 30 cells/uL (ref 0–200)
Basophils Relative: 0.6 %
Eosinophils Absolute: 90 cells/uL (ref 15–500)
Eosinophils Relative: 1.8 %
HCT: 39.8 % (ref 35.0–45.0)
Hemoglobin: 13.5 g/dL (ref 11.7–15.5)
Lymphs Abs: 1655 cells/uL (ref 850–3900)
MCH: 33.1 pg — ABNORMAL HIGH (ref 27.0–33.0)
MCHC: 33.9 g/dL (ref 32.0–36.0)
MCV: 97.5 fL (ref 80.0–100.0)
MPV: 11.3 fL (ref 7.5–12.5)
Monocytes Relative: 9 %
Neutro Abs: 2775 cells/uL (ref 1500–7800)
Neutrophils Relative %: 55.5 %
Platelets: 208 10*3/uL (ref 140–400)
RBC: 4.08 10*6/uL (ref 3.80–5.10)
RDW: 12.1 % (ref 11.0–15.0)
Total Lymphocyte: 33.1 %
WBC: 5 10*3/uL (ref 3.8–10.8)

## 2020-09-26 LAB — C3 AND C4
C3 Complement: 125 mg/dL (ref 83–193)
C4 Complement: 18 mg/dL (ref 15–57)

## 2020-09-26 LAB — SEDIMENTATION RATE: Sed Rate: 11 mm/h (ref 0–20)

## 2020-09-26 LAB — HYDROXYCHLOROQUINE,BLOOD: HYDROXYCHLOROQUINE, (B): 740 ng/mL — ABNORMAL HIGH

## 2020-09-26 LAB — ANTI-DNA ANTIBODY, DOUBLE-STRANDED: ds DNA Ab: 2 IU/mL

## 2020-09-26 LAB — VITAMIN D 25 HYDROXY (VIT D DEFICIENCY, FRACTURES): Vit D, 25-Hydroxy: 30 ng/mL (ref 30–100)

## 2020-09-26 NOTE — Telephone Encounter (Signed)
-----   Message from Pollyann Savoy, MD sent at 09/26/2020 12:52 PM EDT ----- CBC is normal, creatinine was mildly elevated.  Please encourage increased water intake.  Trace protein was positive.  Please advise patient to come in for protein creatinine ratio.  Double-stranded DNA, complements, sedimentation rate are within nor mal limits which do not indicate active disease.  Vitamin D is at the lower limits of normal.  Please advise patient to take vitamin D 1000 units daily.  If she is already taking vitamin D 1000 units daily then she may increase it to 2000 units daily .  Hydroxychloroquine level is lower than desired.  Please advise patient to take hydroxychloroquine twice a day on a regular basis.

## 2020-09-26 NOTE — Progress Notes (Signed)
CBC is normal, creatinine was mildly elevated.  Please encourage increased water intake.  Trace protein was positive.  Please advise patient to come in for protein creatinine ratio.  Double-stranded DNA, complements, sedimentation rate are within normal limits which do not indicate active disease.  Vitamin D is at the lower limits of normal.  Please advise patient to take vitamin D 1000 units daily.  If she is already taking vitamin D 1000 units daily then she may increase it to 2000 units daily.  Hydroxychloroquine level is lower than desired.  Please advise patient to take hydroxychloroquine twice a day on a regular basis.

## 2020-12-14 ENCOUNTER — Other Ambulatory Visit: Payer: Self-pay

## 2020-12-14 DIAGNOSIS — M3219 Other organ or system involvement in systemic lupus erythematosus: Secondary | ICD-10-CM

## 2020-12-14 MED ORDER — HYDROXYCHLOROQUINE SULFATE 200 MG PO TABS: ORAL_TABLET | ORAL | 0 refills | Status: DC

## 2020-12-14 NOTE — Telephone Encounter (Signed)
Patient left a voicemail requesting prescription refill of Hydroxychloroquine to be sent to [facility] at [address].

## 2020-12-14 NOTE — Telephone Encounter (Signed)
Next Visit: 02/20/2021  Last Visit: 09/19/2020  Labs: 09/19/2020 CBC is normal, creatinine was mildly elevated.   Eye exam: 04/20/2020    Current Dose per office note 09/19/2020: Plaquenil 200 mg 1 tablet by mouth twice daily  JY:LTEIH systemic lupus erythematosus with other organ involvement   Last Fill: 09/03/2020   Okay to refill Plaquenil?

## 2021-02-06 NOTE — Progress Notes (Signed)
Office Visit Note  Patient: Tammy Giles             Date of Birth: [birth year]           MRN: [id number]             PCP: Janie Morning, DO Referring: Janie Morning, DO Visit Date: 02/20/2021 Occupation: '@GUAROCC' @  Subjective:  Arthralgias   History of Present Illness: Tammy Giles is a 35 y.o. female with history of systemic lupus erythematosus.  Patient is currently taking Plaquenil 200 mg 1 tablet by mouth twice daily.  She is tolerating Plaquenil without any side effects.  She reports that for the past 1 month she has been experiencing some increased fatigue and arthralgias.  She states that she was previously following the AIP diet which have provided some benefits but with the holidays she was not following a strict dietary regimen.  She has been having some increased discomfort in the right wrist, right ankle as well as her lower back.  She saw the chiropractor yesterday which helped her lower back discomfort.  She denies any recent rashes, hair loss, or Raynaud's phenomenon.  She has not had any oral or nasal ulcerations.  She denies any sicca symptoms.  She has not had any shortness of breath, pleuritic chest pain, or palpitations.      Activities of Daily Living:  Patient reports morning stiffness for 0 minutes.   Patient Denies nocturnal pain.  Difficulty dressing/grooming: Denies Difficulty climbing stairs: Denies Difficulty getting out of chair: Denies Difficulty using hands for taps, buttons, cutlery, and/or writing: Denies  Review of Systems  Constitutional:  Positive for fatigue.  HENT:  Negative for mouth sores, mouth dryness and nose dryness.   Eyes:  Negative for pain, itching and dryness.  Respiratory:  Positive for shortness of breath. Negative for difficulty breathing.   Cardiovascular:  Positive for chest pain. Negative for palpitations.  Gastrointestinal:  Positive for constipation. Negative for blood in stool and diarrhea.  Endocrine: Negative for  increased urination.  Genitourinary:  Negative for difficulty urinating.  Musculoskeletal:  Positive for joint pain, joint pain, joint swelling, myalgias, muscle tenderness and myalgias. Negative for morning stiffness.  Skin:  Negative for color change, rash and redness.  Allergic/Immunologic: Negative for susceptible to infections.  Neurological:  Positive for headaches and weakness. Negative for dizziness, numbness and memory loss.  Hematological:  Positive for bruising/bleeding tendency.  Psychiatric/Behavioral:  Negative for confusion.    PMFS History:  Patient Active Problem List   Diagnosis Date Noted   Class 2 obesity without serious comorbidity with body mass index (BMI) of 35.0 to 35.9 in adult 02/13/2017   Raynaud's syndrome without gangrene 02/13/2017   High risk medication use 02/13/2017   Dyslipidemia 12/12/2015   Vitamin D deficiency 12/12/2015   Annual physical exam 12/11/2015   Migraine without aura and with status migrainosus, not intractable 12/11/2015   Obesity (BMI 35.0-39.9 without comorbidity) 12/11/2015   Encounter for long-term (current) use of high-risk medication 04/26/2014   Abnormal Pap smear of cervix 06/16/2013    Class: History of   Lupus (systemic lupus erythematosus) ([place]) 10/22/2011    Past Medical History:  Diagnosis Date   Abnormal Pap smear of cervix    LGSIL 2014 & 2015, ASCUS HPV HR+ 2016, ASCUS HPV HR+ 2017, 12-26-16 LGSIL   Achilles rupture, left 05/13/2014   Irregular periods    has IUD   Lupus ([place])    Migraines    STD (sexually transmitted  disease)    HSV2    Family History  Problem Relation Age of Onset   Hypertension Mother    Hypertension Father    Depression Father    Prostate cancer Father    Heart disease Father    Thyroid disease Sister    Past Surgical History:  Procedure Laterality Date   ACHILLES TENDON SURGERY Left 05/24/2014   Procedure: LEFT ACHILLES TENDON REPAIR;  Surgeon: Leandrew Koyanagi, MD;  Location: [place];  Service: Orthopedics;  Laterality: Left;   COLPOSCOPY     2014, 2015, 2016, 2017 LGSIL, 2018   INTRAUTERINE DEVICE (IUD) INSERTION     insertion 07-18-16   WISDOM TOOTH EXTRACTION     Social History   Social History Narrative   Not on file   Immunization History  Administered Date(s) Administered   Influenza Split 11/20/2014   Influenza,inj,Quad PF,6+ Mos 12/11/2015   PFIZER(Purple Top)SARS-COV-2 Vaccination 07/17/2019, 08/07/2019, 01/11/2020   Tdap 02/18/2007, 12/26/2016     Objective: Vital Signs: BP 114/79 (BP Location: Left Arm, Patient Position: Sitting, Cuff Size: Large)    Pulse 87    Ht '5\' 5"'  (1.651 m)    Wt 258 lb (117 kg)    BMI 42.93 kg/m    Physical Exam Vitals and nursing note reviewed.  Constitutional:      Appearance: She is well-developed.  HENT:     Head: Normocephalic and atraumatic.  Eyes:     Conjunctiva/sclera: Conjunctivae normal.  Cardiovascular:     Rate and Rhythm: Normal rate and regular rhythm.     Heart sounds: Normal heart sounds.  Pulmonary:     Effort: Pulmonary effort is normal.     Breath sounds: Normal breath sounds.  Abdominal:     General: Bowel sounds are normal.     Palpations: Abdomen is soft.  Musculoskeletal:     Cervical back: Normal range of motion.  Lymphadenopathy:     Cervical: No cervical adenopathy.  Skin:    General: Skin is warm and dry.     Capillary Refill: Capillary refill takes less than 2 seconds.  Neurological:     Mental Status: She is alert and oriented to person, place, and time.  Psychiatric:        Behavior: Behavior normal.     Musculoskeletal Exam: C-spine, thoracic spine, lumbar spine good range of motion.  Some right-sided lower back tenderness in the paraspinal muscles.  Shoulder joints and elbow joints have good range of motion with no tenderness or inflammation.  Some fullness in the right wrist with slightly limited extension.  No tenderness or synovitis over MCP joints.  Complete  fist formation bilaterally.  Hip joints have good range of motion.  No tenderness over trochanteric bursa bilaterally.  Knee joints have good range of motion with no warmth or effusion.  Ankle joints have good range of motion with no tenderness.  CDAI Exam: CDAI Score: -- Patient Global: --; Provider Global: -- Swollen: --; Tender: -- Joint Exam 02/20/2021   No joint exam has been documented for this visit   There is currently no information documented on the homunculus. Go to the Rheumatology activity and complete the homunculus joint exam.  Investigation: No additional findings.  Imaging: No results found.  Recent Labs: Lab Results  Component Value Date   WBC 5.0 09/19/2020   HGB 13.5 09/19/2020   PLT 208 09/19/2020   NA 139 09/19/2020   K 4.3 09/19/2020   CL 105 09/19/2020  CO2 27 09/19/2020   GLUCOSE 85 09/19/2020   BUN 20 09/19/2020   CREATININE 0.99 (H) 09/19/2020   BILITOT 0.4 09/19/2020   AST 16 09/19/2020   ALT 16 09/19/2020   PROT 7.6 09/19/2020   CALCIUM 9.2 09/19/2020   GFRAA 97 04/18/2020    Speciality Comments: PLQ Eye Exam: 04/20/2020 WNL @ [facility] Follow up in 9 months  Procedures:  No procedures performed Allergies: Amoxicillin and Other   Assessment / Plan:     Visit Diagnoses: Other systemic lupus erythematosus with other organ involvement (H[facility]) - ANA >1:1280, + Smith, + RNP, + SSA:dry mouth , occasional rashes , photosensitivity: Overall she has clinically been doing well taking Plaquenil 200 mg 1 tablet by mouth twice daily.  Over the past 1 month she has noticed some increased fatigue and arthralgias which she attributes to dietary changes over the holidays.  She was previously following the AIP diet and had noticed some benefits while following it.  Discussed that she should try to lose more plant-based and to follow the Mediterranean diet which is slightly less restrictive than the AIP diet.  She was in agreement.  We also discussed the  importance of regular exercise, good sleep hygiene, and stress management. She has not had any recent rashes, hair loss, increased photosensitivity, Raynaud's phenomenon, shortness of breath, palpitations, pleuritic chest pain, or recent fevers.  Her lungs were clear to auscultation on examination today. The patient is planning to have her IUD removed in 5 to 6 months as long as her disease remains well controlled.  She plans on continuing Plaquenil during pregnancy and while breast-feeding.  She will be following up with her gynecologist to further discuss planning pregnancy in the future. Lab work from 09/19/20 was reviewed today in the office: Hydroxychloroquine blood level 740, vitamin D within normal limits, ESR within normal limits, double-stranded DNA negative, complements within normal limits.  Discussed that her labs were not consistent with a flare at that time.  Discussed the importance of remaining compliant with Plaquenil as prescribed.  The following lab work will be checked today.  She was advised to notify us if she develops signs or symptoms of a flare.  She will follow-up in the office in 5 months. - Plan: CBC with Differential/Platelet, Anti-DNA antibody, double-stranded, C3 and C4, Sedimentation rate, Protein / creatinine ratio, urine, hydroxychloroquine (PLAQUENIL) 200 MG tablet  High risk medication use - Plaquenil 200 mg 1 tablet by mouth twice daily.  Hydroxychloroquine blood level 740 on 09/19/2020.  Discussed the importance of remaining compliant with Plaquenil as prescribed. 01/22/21 CMP, lipid panel, hgbA1c updated.  CBC and CMP updated on 09/19/20. She is due to update lab work.  PLQ Eye Exam: 04/20/2020 WNL @ [facility] Follow up in 9 months.  Due to update Plaquenil eye examination.  - Plan: CBC with Differential/Platelet, Anti-DNA antibody, double-stranded, C3 and C4, Sedimentation rate  Raynaud's syndrome without gangrene -Not currently active.  No digital ulcerations or signs  of gangrene noted.  Plan: CBC with Differential/Platelet, Anti-DNA antibody, double-stranded, C3 and C4, Sedimentation rate  Other fatigue: She has been experiencing some increased fatigue over the past 1 month.  Discussed the importance of regular exercise and good sleep hygiene.  Other medical conditions are listed as follows:  Vitamin D deficiency  Migraine   History of gastroesophageal reflux (GERD)  Dyslipidemia  Orders: Orders Placed This Encounter  Procedures   CBC with Differential/Platelet   Anti-DNA antibody, double-stranded  C3 and C4   Sedimentation rate   Protein / creatinine ratio, urine   Meds ordered this encounter  Medications   hydroxychloroquine (PLAQUENIL) 200 MG tablet    Sig: TAKE 1 TABLET(200 MG) BY MOUTH TWICE DAILY    Dispense:  180 tablet    Refill:  0    Follow-Up Instructions: Return in about 5 months (around 07/21/2021) for Systemic lupus erythematosus.   Ofilia Neas, PA-C  Note - This record has been created using Dragon software.  Chart creation errors have been sought, but may not always  have been located. Such creation errors do not reflect on  the standard of medical care.

## 2021-02-20 ENCOUNTER — Encounter: Payer: Self-pay | Admitting: Physician Assistant

## 2021-02-20 ENCOUNTER — Other Ambulatory Visit: Payer: Self-pay

## 2021-02-20 ENCOUNTER — Ambulatory Visit: Payer: 59 | Admitting: Physician Assistant

## 2021-02-20 VITALS — BP 114/79 | HR 87 | Ht 65.0 in | Wt 258.0 lb

## 2021-02-20 DIAGNOSIS — Z8719 Personal history of other diseases of the digestive system: Secondary | ICD-10-CM

## 2021-02-20 DIAGNOSIS — M3219 Other organ or system involvement in systemic lupus erythematosus: Secondary | ICD-10-CM

## 2021-02-20 DIAGNOSIS — R5383 Other fatigue: Secondary | ICD-10-CM | POA: Diagnosis not present

## 2021-02-20 DIAGNOSIS — G43001 Migraine without aura, not intractable, with status migrainosus: Secondary | ICD-10-CM

## 2021-02-20 DIAGNOSIS — E785 Hyperlipidemia, unspecified: Secondary | ICD-10-CM

## 2021-02-20 DIAGNOSIS — Z79899 Other long term (current) drug therapy: Secondary | ICD-10-CM

## 2021-02-20 DIAGNOSIS — I7300 Raynaud's syndrome without gangrene: Secondary | ICD-10-CM

## 2021-02-20 DIAGNOSIS — E559 Vitamin D deficiency, unspecified: Secondary | ICD-10-CM

## 2021-02-20 MED ORDER — HYDROXYCHLOROQUINE SULFATE 200 MG PO TABS: ORAL_TABLET | ORAL | 0 refills | Status: DC

## 2021-02-21 LAB — CBC WITH DIFFERENTIAL/PLATELET
Absolute Monocytes: 620 cells/uL (ref 200–950)
Basophils Absolute: 35 cells/uL (ref 0–200)
Basophils Relative: 0.3 %
Eosinophils Absolute: 94 cells/uL (ref 15–500)
Eosinophils Relative: 0.8 %
HCT: 40 % (ref 35.0–45.0)
Hemoglobin: 13.5 g/dL (ref 11.7–15.5)
Lymphs Abs: 1041 cells/uL (ref 850–3900)
MCH: 33.1 pg — ABNORMAL HIGH (ref 27.0–33.0)
MCHC: 33.8 g/dL (ref 32.0–36.0)
MCV: 98 fL (ref 80.0–100.0)
MPV: 11.7 fL (ref 7.5–12.5)
Monocytes Relative: 5.3 %
Neutro Abs: 9910 cells/uL — ABNORMAL HIGH (ref 1500–7800)
Neutrophils Relative %: 84.7 %
Platelets: 227 10*3/uL (ref 140–400)
RBC: 4.08 10*6/uL (ref 3.80–5.10)
RDW: 12.1 % (ref 11.0–15.0)
Total Lymphocyte: 8.9 %
WBC: 11.7 10*3/uL — ABNORMAL HIGH (ref 3.8–10.8)

## 2021-02-21 LAB — C3 AND C4
C3 Complement: 152 mg/dL (ref 83–193)
C4 Complement: 21 mg/dL (ref 15–57)

## 2021-02-21 LAB — ANTI-DNA ANTIBODY, DOUBLE-STRANDED: ds DNA Ab: 2 IU/mL

## 2021-02-21 LAB — PROTEIN / CREATININE RATIO, URINE
Creatinine, Urine: 152 mg/dL (ref 20–275)
Protein/Creat Ratio: 79 mg/g creat (ref 24–184)
Protein/Creatinine Ratio: 0.079 mg/mg creat (ref 0.024–0.184)
Total Protein, Urine: 12 mg/dL (ref 5–24)

## 2021-02-21 LAB — SEDIMENTATION RATE: Sed Rate: 28 mm/h — ABNORMAL HIGH (ref 0–20)

## 2021-02-21 NOTE — Progress Notes (Signed)
WBC count is borderline elevated.  Absolute neutrophil count is slightly high.  She had a recent infection.   ESR is slightly elevated-likely secondary to infection.   Protein creatinine ratio is WNL.

## 2021-02-21 NOTE — Progress Notes (Signed)
dsDNA is negative

## 2021-02-22 NOTE — Progress Notes (Signed)
Complements WNL

## 2021-03-13 ENCOUNTER — Other Ambulatory Visit: Payer: Self-pay | Admitting: Rheumatology

## 2021-03-13 DIAGNOSIS — M3219 Other organ or system involvement in systemic lupus erythematosus: Secondary | ICD-10-CM

## 2021-06-14 ENCOUNTER — Other Ambulatory Visit: Payer: Self-pay | Admitting: Rheumatology

## 2021-06-14 DIAGNOSIS — M3219 Other organ or system involvement in systemic lupus erythematosus: Secondary | ICD-10-CM

## 2021-06-14 MED ORDER — HYDROXYCHLOROQUINE SULFATE 200 MG PO TABS: ORAL_TABLET | ORAL | 0 refills | Status: DC

## 2021-06-14 NOTE — Telephone Encounter (Signed)
Next Visit: 07/29/2021 ? ?Last Visit: 02/20/2021 ? ?Labs: 02/20/2021 WBC count is borderline elevated.  Absolute neutrophil count is slightly high.  She had a recent infection.  ? ?Eye exam: 04/20/2020 WNL  ? ?Current Dose per office note 02/20/2021: Plaquenil 200 mg 1 tablet by mouth twice daily ? ?ZS:WFUXN systemic lupus erythematosus with other organ involvement  ? ?Last Fill: 02/20/2021 ? ?Patient advised she is due to update her PLQ eye exam. Patient states she recently had an eye exam but is unsure if they did what was needed. She is reaching out to the eye doctor.  ? ?Okay to refill Plaquenil?  ?

## 2021-06-14 NOTE — Telephone Encounter (Signed)
Patient called the office requesting a refill of Plaquenil 200mg  be sent to [facility] on [address]. ?

## 2021-07-16 NOTE — Progress Notes (Signed)
Office Visit Note  Patient: Tammy Giles             Date of Birth: 1985/03/30           MRN: [id number]             PCP: Janie Morning, DO Referring: Janie Morning, DO Visit Date: 07/29/2021 Occupation: @GUAROCC @  Subjective:  Medication management  History of Present Illness: Tammy Giles is a 36 y.o. female history of systemic lupus dermatosis.  She states she has been experiencing increased fatigue recently.  She also gives history of intermittent swelling in her wrist joints.  She states she has been taking hydroxychloroquine 200 mg twice a day on a regular basis.  She noticed lymph node enlargement in the cervical region few months back.  She denies any history of oral ulcers, nasal ulcers, sicca symptoms, malar rash, photosensitivity, Raynaud's phenomenon.  Patient is planning pregnancy in the near future.  Activities of Daily Living:  Patient reports morning stiffness for 1 hour.   Patient Reports nocturnal pain.  Difficulty dressing/grooming: Denies Difficulty climbing stairs: Denies Difficulty getting out of chair: Denies Difficulty using hands for taps, buttons, cutlery, and/or writing: Denies  Review of Systems  Constitutional:  Positive for fatigue.  HENT:  Negative for mouth sores and mouth dryness.   Eyes:  Negative for dryness.  Respiratory:  Negative for shortness of breath.   Cardiovascular:  Negative for chest pain, palpitations and swelling in legs/feet.  Gastrointestinal:  Negative for constipation.  Endocrine: Positive for cold intolerance.  Genitourinary:  Negative for difficulty urinating.  Musculoskeletal:  Positive for joint pain, joint pain, joint swelling and morning stiffness.  Skin:  Negative for color change, rash and sensitivity to sunlight.  Allergic/Immunologic: Positive for susceptible to infections.  Neurological:  Negative for numbness.  Hematological:  Negative for bruising/bleeding tendency and swollen glands.  Psychiatric/Behavioral:   Negative for depressed mood and sleep disturbance. The patient is not nervous/anxious.     PMFS History:  Patient Active Problem List   Diagnosis Date Noted   Class 2 obesity without serious comorbidity with body mass index (BMI) of 35.0 to 35.9 in adult 02/13/2017   Raynaud's syndrome without gangrene 02/13/2017   High risk medication use 02/13/2017   Dyslipidemia 12/12/2015   Vitamin D deficiency 12/12/2015   Annual physical exam 12/11/2015   Migraine without aura and with status migrainosus, not intractable 12/11/2015   Obesity (BMI 35.0-39.9 without comorbidity) 12/11/2015   Encounter for long-term (current) use of high-risk medication 04/26/2014   Abnormal Pap smear of cervix 06/16/2013    Class: History of   Lupus (systemic lupus erythematosus) ([place]) 10/22/2011    Past Medical History:  Diagnosis Date   Abnormal Pap smear of cervix    LGSIL 2014 & 2015, ASCUS HPV HR+ 2016, ASCUS HPV HR+ 2017, 12-26-16 LGSIL   Achilles rupture, left 05/13/2014   Irregular periods    has IUD   Lupus ([place])    Migraines    STD (sexually transmitted disease)    HSV2    Family History  Problem Relation Age of Onset   Hypertension Mother    Hypertension Father    Depression Father    Prostate cancer Father    Heart disease Father    Thyroid disease Sister    Past Surgical History:  Procedure Laterality Date   ACHILLES TENDON SURGERY Left 05/24/2014   Procedure: LEFT ACHILLES TENDON REPAIR;  Surgeon: Leandrew Koyanagi, MD;  Location: [facility];  Service: Orthopedics;  Laterality: Left;   COLPOSCOPY     2014, 2015, 2016, 2017 LGSIL, 2018   INTRAUTERINE DEVICE (IUD) INSERTION     insertion 07-18-16   WISDOM TOOTH EXTRACTION     Social History   Social History Narrative   Not on file   Immunization History  Administered Date(s) Administered   Influenza Split 11/20/2014   Influenza,inj,Quad PF,6+ Mos 12/11/2015   PFIZER(Purple Top)SARS-COV-2 Vaccination 07/17/2019,  08/07/2019, 01/11/2020   Tdap 02/18/2007, 12/26/2016     Objective: Vital Signs: BP 124/88 (BP Location: Left Arm, Patient Position: Sitting, Cuff Size: Normal)   Pulse 61   Resp 15   Ht 5\' 5"  (1.651 m)   Wt 269 lb (122 kg)   BMI 44.76 kg/m    Physical Exam Vitals and nursing note reviewed.  Constitutional:      Appearance: She is well-developed.  HENT:     Head: Normocephalic and atraumatic.  Eyes:     Conjunctiva/sclera: Conjunctivae normal.  Cardiovascular:     Rate and Rhythm: Normal rate and regular rhythm.     Heart sounds: Normal heart sounds.  Pulmonary:     Effort: Pulmonary effort is normal.     Breath sounds: Normal breath sounds.  Abdominal:     General: Bowel sounds are normal.     Palpations: Abdomen is soft.  Musculoskeletal:     Cervical back: Normal range of motion.  Lymphadenopathy:     Cervical: No cervical adenopathy.  Skin:    General: Skin is warm and dry.     Capillary Refill: Capillary refill takes less than 2 seconds.  Neurological:     Mental Status: She is alert and oriented to person, place, and time.  Psychiatric:        Behavior: Behavior normal.      Musculoskeletal Exam: C-spine was in good range of motion.  Shoulder joints, elbow joints, wrist joints, MCPs PIPs and DIPs with good range of motion.  She has discomfort with range of motion of her right wrist joint.  No synovitis was noted.  Hip joints, knee joints, ankles, MTPs and PIPs with good range of motion with no synovitis.  CDAI Exam: CDAI Score: -- Patient Global: --; Provider Global: -- Swollen: --; Tender: -- Joint Exam 07/29/2021   No joint exam has been documented for this visit   There is currently no information documented on the homunculus. Go to the Rheumatology activity and complete the homunculus joint exam.  Investigation: No additional findings.  Imaging: No results found.  Recent Labs: Lab Results  Component Value Date   WBC 11.7 (H) 02/20/2021   HGB  13.5 02/20/2021   PLT 227 02/20/2021   NA 139 09/19/2020   K 4.3 09/19/2020   CL 105 09/19/2020   CO2 27 09/19/2020   GLUCOSE 85 09/19/2020   BUN 20 09/19/2020   CREATININE 0.99 (H) 09/19/2020   BILITOT 0.4 09/19/2020   AST 16 09/19/2020   ALT 16 09/19/2020   PROT 7.6 09/19/2020   CALCIUM 9.2 09/19/2020   GFRAA 97 04/18/2020    Speciality Comments: PLQ Eye Exam: 04/20/2020 WNL @ [facility] Follow up in 9 months Patient will have [place] Exam faxed to [facility]   Procedures:  No procedures performed Allergies: Amoxicillin and Other   Assessment / Plan:     Visit Diagnoses: Other systemic lupus erythematosus with other organ involvement ([place]) - ANA >1:1280, + Smith, + RNP, + SSA:dry  mouth , occasional rashes , photosensitivity -she complains of increased fatigue.  She also has been experiencing intermittent swelling in her wrist joints.  She states she had cervical lymphadenopathy about a month ago which resolved.  According the patient she has been taking hydroxychloroquine 200 mg p.o. twice daily on a regular basis.  She is also planning pregnancy.  I discussed the increased risk of cardiac conduction abnormality in the fetus with maternal SSA antibody.  She will need to be on hydroxychloroquine on a regular basis during the pregnancy.  She should also schedule an appointment with the high risk OB.  I will obtain autoimmune labs today.  I also discussed possible use of Imuran if her disease is active.  She is hesitant to take additional medications.  Plan: Protein / creatinine ratio, urine, Anti-DNA antibody, double-stranded, C3 and C4, Sedimentation rate.  We will contact her once the lab results are available.  High risk medication use - Plaquenil 200 mg 1 tablet by mouth twice daily.  Hydroxychloroquine blood level 740 on 09/19/2020. PLQ Eye Exam: 04/20/2020.  Patient states she had eye examination in March 2023.  We do not have the results.  Patient will have the results faxed  to [place].  Labs from February 20, 2021 showed sed rate 28, complements are normal, double-stranded DNA was 2.  Lab findings were discussed with the patient.- Plan: CBC with Differential/Platelet, COMPLETE METABOLIC PANEL WITH GFR.  Information regarding immunization was placed in the AVS.  Right wrist pain-she gives history of intermittent discomfort in her right wrist.  She had discomfort range of motion.  No synovitis was noted.  Raynaud's syndrome without gangrene-currently not active.  Other fatigue-she has been experiencing increased fatigue.  She is off melatonin and has been sleeping well per patient.  Vitamin D deficiency -her vitamin D was low normal at 30 on September 19, 2020.  She has not been taking vitamin D.  I will check vitamin D level today.  It may be contributing to fatigue.  Plan: VITAMIN D 25 Hydroxy (Vit-D Deficiency, Fractures)  Migraine -no recent episodes.  History of gastroesophageal reflux (GERD)  Dyslipidemia-increased risk of heart disease with autoimmune disease was discussed.  Dietary modifications and exercise was emphasized.  A handout heart disease prevention was also provided.  Orders: Orders Placed This Encounter  Procedures   Protein / creatinine ratio, urine   CBC with Differential/Platelet   COMPLETE METABOLIC PANEL WITH GFR   Anti-DNA antibody, double-stranded   C3 and C4   Sedimentation rate   VITAMIN D 25 Hydroxy (Vit-D Deficiency, Fractures)   No orders of the defined types were placed in this encounter.    Follow-Up Instructions: Return in about 3 months (around 10/29/2021) for Systemic lupus.   Bo Merino, MD  Note - This record has been created using Editor, commissioning.  Chart creation errors have been sought, but may not always  have been located. Such creation errors do not reflect on  the standard of medical care.

## 2021-07-22 ENCOUNTER — Ambulatory Visit: Payer: 59 | Admitting: Rheumatology

## 2021-07-29 ENCOUNTER — Telehealth: Payer: Self-pay | Admitting: Rheumatology

## 2021-07-29 ENCOUNTER — Ambulatory Visit: Payer: 59 | Admitting: Rheumatology

## 2021-07-29 ENCOUNTER — Encounter: Payer: Self-pay | Admitting: Rheumatology

## 2021-07-29 VITALS — BP 124/88 | HR 61 | Resp 15 | Ht 65.0 in | Wt 269.0 lb

## 2021-07-29 DIAGNOSIS — I7300 Raynaud's syndrome without gangrene: Secondary | ICD-10-CM

## 2021-07-29 DIAGNOSIS — R5383 Other fatigue: Secondary | ICD-10-CM | POA: Diagnosis not present

## 2021-07-29 DIAGNOSIS — M3219 Other organ or system involvement in systemic lupus erythematosus: Secondary | ICD-10-CM

## 2021-07-29 DIAGNOSIS — G43001 Migraine without aura, not intractable, with status migrainosus: Secondary | ICD-10-CM

## 2021-07-29 DIAGNOSIS — E559 Vitamin D deficiency, unspecified: Secondary | ICD-10-CM

## 2021-07-29 DIAGNOSIS — Z8719 Personal history of other diseases of the digestive system: Secondary | ICD-10-CM

## 2021-07-29 DIAGNOSIS — E785 Hyperlipidemia, unspecified: Secondary | ICD-10-CM

## 2021-07-29 DIAGNOSIS — M25531 Pain in right wrist: Secondary | ICD-10-CM

## 2021-07-29 DIAGNOSIS — Z79899 Other long term (current) drug therapy: Secondary | ICD-10-CM | POA: Diagnosis not present

## 2021-07-29 NOTE — Patient Instructions (Signed)
Vaccines You are taking a medication(s) that can suppress your immune system.  The following immunizations are recommended: Flu annually Covid-19  Td/Tdap (tetanus, diphtheria, pertussis) every 10 years Pneumonia (Prevnar 15 then Pneumovax 23 at least 1 year apart.  Alternatively, can take Prevnar 20 without needing additional dose) Shingrix: 2 doses from 4 weeks to 6 months apart  Please check with your PCP to make sure you are up to date.   Heart Disease Prevention   Your inflammatory disease increases your risk of heart disease which includes heart attack, stroke, atrial fibrillation (irregular heartbeats), high blood pressure, heart failure and atherosclerosis (plaque in the arteries).  It is important to reduce your risk by:   Keep blood pressure, cholesterol, and blood sugar at healthy levels   Smoking Cessation   Maintain a healthy weight  BMI 20-25   Eat a healthy diet  Plenty of fresh fruit, vegetables, and whole grains  Limit saturated fats, foods high in sodium, and added sugars  DASH and Mediterranean diet   Increase physical activity  Recommend moderate physically activity for 150 minutes per week/ 30 minutes a day for five days a week These can be broken up into three separate ten-minute sessions during the day.   Reduce Stress  Meditation, slow breathing exercises, yoga, coloring books  Dental visits twice a year   

## 2021-07-29 NOTE — Telephone Encounter (Signed)
Tammy Giles from [facility] called the office stating someone reached out to them to get a plaquenil report for the patient. She states the patient was not seen there but at the [facility] location. Last seen there in 2021. The number to that office is [phone].

## 2021-07-30 NOTE — Progress Notes (Signed)
CBC, CMP, urine protein, vitamin D, sed rate, complements are within normal limits.  Labs do not indicate a disease flare.

## 2021-08-08 LAB — COMPLETE METABOLIC PANEL WITH GFR
AG Ratio: 1.1 (calc) (ref 1.0–2.5)
ALT: 14 U/L (ref 6–29)
AST: 18 U/L (ref 10–30)
Albumin: 3.8 g/dL (ref 3.6–5.1)
Alkaline phosphatase (APISO): 66 U/L (ref 31–125)
BUN: 14 mg/dL (ref 7–25)
CO2: 26 mmol/L (ref 20–32)
Calcium: 9.1 mg/dL (ref 8.6–10.2)
Chloride: 103 mmol/L (ref 98–110)
Creat: 0.81 mg/dL (ref 0.50–0.97)
Globulin: 3.4 g/dL (calc) (ref 1.9–3.7)
Glucose, Bld: 79 mg/dL (ref 65–99)
Potassium: 4.1 mmol/L (ref 3.5–5.3)
Sodium: 136 mmol/L (ref 135–146)
Total Bilirubin: 0.4 mg/dL (ref 0.2–1.2)
Total Protein: 7.2 g/dL (ref 6.1–8.1)
eGFR: 98 mL/min/{1.73_m2} (ref >=60)

## 2021-08-08 LAB — CBC WITH DIFFERENTIAL/PLATELET
Absolute Monocytes: 626 cells/uL (ref 200–950)
Basophils Absolute: 20 cells/uL (ref 0–200)
Basophils Relative: 0.3 %
Eosinophils Absolute: 102 cells/uL (ref 15–500)
Eosinophils Relative: 1.5 %
HCT: 40.5 % (ref 35.0–45.0)
Hemoglobin: 13.6 g/dL (ref 11.7–15.5)
Lymphs Abs: 1816 cells/uL (ref 850–3900)
MCH: 33.2 pg — ABNORMAL HIGH (ref 27.0–33.0)
MCHC: 33.6 g/dL (ref 32.0–36.0)
MCV: 98.8 fL (ref 80.0–100.0)
MPV: 11.4 fL (ref 7.5–12.5)
Monocytes Relative: 9.2 %
Neutro Abs: 4236 cells/uL (ref 1500–7800)
Neutrophils Relative %: 62.3 %
Platelets: 231 10*3/uL (ref 140–400)
RBC: 4.1 10*6/uL (ref 3.80–5.10)
RDW: 12.2 % (ref 11.0–15.0)
Total Lymphocyte: 26.7 %
WBC: 6.8 10*3/uL (ref 3.8–10.8)

## 2021-08-08 LAB — PROTEIN / CREATININE RATIO, URINE
Creatinine, Urine: 95 mg/dL (ref 20–275)
Protein/Creat Ratio: 63 mg/g creat (ref 24–184)
Protein/Creatinine Ratio: 0.063 mg/mg creat (ref 0.024–0.184)
Total Protein, Urine: 6 mg/dL (ref 5–24)

## 2021-08-08 LAB — SEDIMENTATION RATE: Sed Rate: 17 mm/h (ref 0–20)

## 2021-08-08 LAB — C3 AND C4
C3 Complement: 131 mg/dL (ref 83–193)
C4 Complement: 20 mg/dL (ref 15–57)

## 2021-08-08 LAB — HYDROXYCHLOROQUINE,BLOOD: HYDROXYCHLOROQUINE, (B): 890 ng/mL — ABNORMAL HIGH

## 2021-08-08 LAB — ANTI-DNA ANTIBODY, DOUBLE-STRANDED: ds DNA Ab: 2 IU/mL

## 2021-08-08 LAB — VITAMIN D 25 HYDROXY (VIT D DEFICIENCY, FRACTURES): Vit D, 25-Hydroxy: 32 ng/mL (ref 30–100)

## 2021-09-11 ENCOUNTER — Other Ambulatory Visit: Payer: Self-pay | Admitting: Rheumatology

## 2021-09-11 DIAGNOSIS — M3219 Other organ or system involvement in systemic lupus erythematosus: Secondary | ICD-10-CM

## 2021-09-11 NOTE — Telephone Encounter (Signed)
Next Visit: 11/04/2021  Last Visit: 07/29/2021  Labs: 07/29/2021, CBC, CMP, urine protein, vitamin D, sed rate, complements are within normal limits.  Labs do not indicate a disease flare.  Eye exam: 04/20/2020 WNL,  [facility] for patient to have eye exam performed  Current Dose per office note 07/29/2021: Plaquenil 200 mg 1 tablet by mouth twice daily  EN:MMHWK systemic lupus erythematosus with other organ involvement   Last Fill: 06/14/2021  Okay to refill Plaquenil?

## 2021-10-22 NOTE — Progress Notes (Deleted)
Office Visit Note  Patient: Tammy Giles             Date of Birth: Jul 13, 1985           MRN: [id number]             PCP: Janie Morning, DO Referring: Janie Morning, DO Visit Date: 11/04/2021 Occupation: '@GUAROCC' @  Subjective:    History of Present Illness: Kaytelyn Glore is a 36 y.o. female with history of systemic lupus erythematosus.  She is taking plaquenil 200 mg 1 tablet by mouth twice daily.   PLQ Eye Exam on 08/21/2021 at [facility]  Call to obtain records for eye exam   CBC and CMP updated on 07/29/2021.  Orders for CBC and CMP were released today.  Autoimmune lab work from 07/29/2021 was reviewed today in the office: Hydroxychloroquine blood level 890, vitamin D 32, ESR 17, complements within normal limits, double-stranded DNA 2, CMP within normal limits, protein creatinine ratio within normal limits, white blood cell count 6.8, hemoglobin 13.6, platelet count 231.   Activities of Daily Living:  Patient reports morning stiffness for *** {minute/hour:19697}.   Patient {ACTIONS;DENIES/REPORTS:21021675::"Denies"} nocturnal pain.  Difficulty dressing/grooming: {ACTIONS;DENIES/REPORTS:21021675::"Denies"} Difficulty climbing stairs: {ACTIONS;DENIES/REPORTS:21021675::"Denies"} Difficulty getting out of chair: {ACTIONS;DENIES/REPORTS:21021675::"Denies"} Difficulty using hands for taps, buttons, cutlery, and/or writing: {ACTIONS;DENIES/REPORTS:21021675::"Denies"}  No Rheumatology ROS completed.   PMFS History:  Patient Active Problem List   Diagnosis Date Noted   Class 2 obesity without serious comorbidity with body mass index (BMI) of 35.0 to 35.9 in adult 02/13/2017   Raynaud's syndrome without gangrene 02/13/2017   High risk medication use 02/13/2017   Dyslipidemia 12/12/2015   Vitamin D deficiency 12/12/2015   Annual physical exam 12/11/2015   Migraine without aura and with status migrainosus, not intractable 12/11/2015   Obesity (BMI 35.0-39.9 without comorbidity)  12/11/2015   Encounter for long-term (current) use of high-risk medication 04/26/2014   Abnormal Pap smear of cervix 06/16/2013    Class: History of   Lupus (systemic lupus erythematosus) ([place]) 10/22/2011    Past Medical History:  Diagnosis Date   Abnormal Pap smear of cervix    LGSIL 2014 & 2015, ASCUS HPV HR+ 2016, ASCUS HPV HR+ 2017, 12-26-16 LGSIL   Achilles rupture, left 05/13/2014   Irregular periods    has IUD   Lupus ([place])    Migraines    STD (sexually transmitted disease)    HSV2    Family History  Problem Relation Age of Onset   Hypertension Mother    Hypertension Father    Depression Father    Prostate cancer Father    Heart disease Father    Thyroid disease Sister    Past Surgical History:  Procedure Laterality Date   ACHILLES TENDON SURGERY Left 05/24/2014   Procedure: LEFT ACHILLES TENDON REPAIR;  Surgeon: Leandrew Koyanagi, MD;  Location: [place];  Service: Orthopedics;  Laterality: Left;   COLPOSCOPY     2014, 2015, 2016, 2017 LGSIL, 2018   INTRAUTERINE DEVICE (IUD) INSERTION     insertion 07-18-16   WISDOM TOOTH EXTRACTION     Social History   Social History Narrative   Not on file   Immunization History  Administered Date(s) Administered   Influenza Split 11/20/2014   Influenza,inj,Quad PF,6+ Mos 12/11/2015   PFIZER(Purple Top)SARS-COV-2 Vaccination 07/17/2019, 08/07/2019, 01/11/2020   Tdap 02/18/2007, 12/26/2016     Objective: Vital Signs: There were no vitals taken for this visit.   Physical Exam Vitals and nursing note  reviewed.  Constitutional:      Appearance: She is well-developed.  HENT:     Head: Normocephalic and atraumatic.  Eyes:     Conjunctiva/sclera: Conjunctivae normal.  Cardiovascular:     Rate and Rhythm: Normal rate and regular rhythm.     Heart sounds: Normal heart sounds.  Pulmonary:     Effort: Pulmonary effort is normal.     Breath sounds: Normal breath sounds.  Abdominal:     General: Bowel sounds are  normal.     Palpations: Abdomen is soft.  Musculoskeletal:     Cervical back: Normal range of motion.  Lymphadenopathy:     Cervical: No cervical adenopathy.  Skin:    General: Skin is warm and dry.     Capillary Refill: Capillary refill takes less than 2 seconds.  Neurological:     Mental Status: She is alert and oriented to person, place, and time.  Psychiatric:        Behavior: Behavior normal.      Musculoskeletal Exam: ***  CDAI Exam: CDAI Score: -- Patient Global: --; Provider Global: -- Swollen: --; Tender: -- Joint Exam 11/04/2021   No joint exam has been documented for this visit   There is currently no information documented on the homunculus. Go to the Rheumatology activity and complete the homunculus joint exam.  Investigation: No additional findings.  Imaging: No results found.  Recent Labs: Lab Results  Component Value Date   WBC 6.8 07/29/2021   HGB 13.6 07/29/2021   PLT 231 07/29/2021   NA 136 07/29/2021   K 4.1 07/29/2021   CL 103 07/29/2021   CO2 26 07/29/2021   GLUCOSE 79 07/29/2021   BUN 14 07/29/2021   CREATININE 0.81 07/29/2021   BILITOT 0.4 07/29/2021   AST 18 07/29/2021   ALT 14 07/29/2021   PROT 7.2 07/29/2021   CALCIUM 9.1 07/29/2021   GFRAA 97 04/18/2020    Speciality Comments: PLQ Eye Exam: 04/20/2020 WNL @ [place] Follow up in 9 months Patients last name is Derrington   Patient stated she had PLQ Eye Exam on 08/21/2021 at  [facility], patient wil call to have eye exam faxed to office.  Procedures:  No procedures performed Allergies: Amoxicillin and Other   Assessment / Plan:     Visit Diagnoses: No diagnosis found.  Orders: No orders of the defined types were placed in this encounter.  No orders of the defined types were placed in this encounter.   Face-to-face time spent with patient was *** minutes. Greater than 50% of time was spent in counseling and coordination of care.  Follow-Up Instructions: No  follow-ups on file.   Earnestine Mealing, CMA  Note - This record has been created using Editor, commissioning.  Chart creation errors have been sought, but may not always  have been located. Such creation errors do not reflect on  the standard of medical care.

## 2021-11-04 ENCOUNTER — Ambulatory Visit: Payer: 59 | Admitting: Physician Assistant

## 2021-11-04 DIAGNOSIS — Z8719 Personal history of other diseases of the digestive system: Secondary | ICD-10-CM

## 2021-11-04 DIAGNOSIS — I7300 Raynaud's syndrome without gangrene: Secondary | ICD-10-CM

## 2021-11-04 DIAGNOSIS — M3219 Other organ or system involvement in systemic lupus erythematosus: Secondary | ICD-10-CM

## 2021-11-04 DIAGNOSIS — G43001 Migraine without aura, not intractable, with status migrainosus: Secondary | ICD-10-CM

## 2021-11-04 DIAGNOSIS — E785 Hyperlipidemia, unspecified: Secondary | ICD-10-CM

## 2021-11-04 DIAGNOSIS — Z79899 Other long term (current) drug therapy: Secondary | ICD-10-CM

## 2021-11-04 DIAGNOSIS — E559 Vitamin D deficiency, unspecified: Secondary | ICD-10-CM

## 2021-11-04 DIAGNOSIS — R5383 Other fatigue: Secondary | ICD-10-CM

## 2021-12-05 ENCOUNTER — Other Ambulatory Visit: Payer: Self-pay | Admitting: Physician Assistant

## 2021-12-05 DIAGNOSIS — M3219 Other organ or system involvement in systemic lupus erythematosus: Secondary | ICD-10-CM

## 2021-12-10 ENCOUNTER — Encounter: Payer: Self-pay | Admitting: Orthopaedic Surgery

## 2021-12-10 ENCOUNTER — Ambulatory Visit (INDEPENDENT_AMBULATORY_CARE_PROVIDER_SITE_OTHER): Payer: 59 | Admitting: Orthopaedic Surgery

## 2021-12-10 DIAGNOSIS — S161XXA Strain of muscle, fascia and tendon at neck level, initial encounter: Secondary | ICD-10-CM
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
## 2021-12-11 DIAGNOSIS — S161XXA Strain of muscle, fascia and tendon at neck level, initial encounter: Secondary | ICD-10-CM | POA: Insufficient documentation

## 2021-12-16 NOTE — Progress Notes (Deleted)
Office Visit Note  Patient: Tammy Giles             Date of Birth: [birth year]           MRN: [id number]             PCP: Tammy Morning, DO Referring: Tammy Morning, DO Visit Date: 12/18/2021 Occupation: _0 @  Subjective:    History of Present Illness: Tammy Giles is a 36 y.o. female with history of systemic lupus erythematosus.  She remains on plaquenil 200 mg 1 tablet by mouth twice daily.    Need record of plaquenil eye exam on 08/21/21.   Lab work from 07/29/21: CBC and CMP WNL, protein creatinine ratio WNL, vitamin D 32, ESR WNL, complements WNL, dsDNA negative, and Hydroxychloroquine 890.   Activities of Daily Living:  Patient reports Giles stiffness for *** {minute/hour:19697}.   Patient {ACTIONS;DENIES/REPORTS:21021675::"Denies"} nocturnal pain.  Difficulty dressing/grooming: {ACTIONS;DENIES/REPORTS:21021675::"Denies"} Difficulty climbing stairs: {ACTIONS;DENIES/REPORTS:21021675::"Denies"} Difficulty getting out of chair: {ACTIONS;DENIES/REPORTS:21021675::"Denies"} Difficulty using hands for taps, buttons, cutlery, and/or writing: {ACTIONS;DENIES/REPORTS:21021675::"Denies"}  No Rheumatology ROS completed.   PMFS History:  Patient Active Problem List   Diagnosis Date Noted   Cervical strain 12/11/2021   Class 2 obesity without serious comorbidity with body mass index (BMI) of 35.0 to 35.9 in adult 02/13/2017   Raynaud's syndrome without gangrene 02/13/2017   High risk medication use 02/13/2017   Dyslipidemia 12/12/2015   Vitamin D deficiency 12/12/2015   Annual physical exam 12/11/2015   Migraine without aura and with status migrainosus, not intractable 12/11/2015   Obesity (BMI 35.0-39.9 without comorbidity) 12/11/2015   Encounter for long-term (current) use of high-risk medication 04/26/2014   Abnormal Pap smear of cervix 06/16/2013    Class: History of   Lupus (systemic lupus erythematosus) ([place]) 10/22/2011    Past Medical History:  Diagnosis  Date   Abnormal Pap smear of cervix    LGSIL 2014 & 2015, ASCUS HPV HR+ 2016, ASCUS HPV HR+ 2017, 12-26-16 LGSIL   Achilles rupture, left 05/13/2014   Irregular periods    has IUD   Lupus ([place])    Migraines    STD (sexually transmitted disease)    HSV2    Family History  Problem Relation Age of Onset   Hypertension Mother    Hypertension Father    Depression Father    Prostate cancer Father    Heart disease Father    Thyroid disease Sister    Past Surgical History:  Procedure Laterality Date   ACHILLES TENDON SURGERY Left 05/24/2014   Procedure: LEFT ACHILLES TENDON REPAIR;  Surgeon: Tammy Koyanagi, MD;  Location: [place];  Service: Orthopedics;  Laterality: Left;   COLPOSCOPY     2014, 2015, 2016, 2017 LGSIL, 2018   INTRAUTERINE DEVICE (IUD) INSERTION     insertion 07-18-16   WISDOM TOOTH EXTRACTION     Social History   Social History Narrative   Not on file   Immunization History  Administered Date(s) Administered   Influenza Split 11/20/2014   Influenza,inj,Quad PF,6+ Mos 12/11/2015   PFIZER(Purple Top)SARS-COV-2 Vaccination 07/17/2019, 08/07/2019, 01/11/2020   Tdap 02/18/2007, 12/26/2016     Objective: Vital Signs: There were no vitals taken for this visit.   Physical Exam Vitals and nursing note reviewed.  Constitutional:      Appearance: She is well-developed.  HENT:     Head: Normocephalic and atraumatic.  Eyes:     Conjunctiva/sclera: Conjunctivae normal.  Cardiovascular:     Rate and Rhythm: Normal rate  and regular rhythm.     Heart sounds: Normal heart sounds.  Pulmonary:     Effort: Pulmonary effort is normal.     Breath sounds: Normal breath sounds.  Abdominal:     General: Bowel sounds are normal.     Palpations: Abdomen is soft.  Musculoskeletal:     Cervical back: Normal range of motion.  Skin:    General: Skin is warm and dry.     Capillary Refill: Capillary refill takes less than 2 seconds.  Neurological:     Mental  Status: She is alert and oriented to person, place, and time.  Psychiatric:        Behavior: Behavior normal.      Musculoskeletal Exam: ***  CDAI Exam: CDAI Score: -- Patient Global: --; Provider Global: -- Swollen: --; Tender: -- Joint Exam 12/18/2021   No joint exam has been documented for this visit   There is currently no information documented on the homunculus. Go to the Rheumatology activity and complete the homunculus joint exam.  Investigation: No additional findings.  Imaging: No results found.  Recent Labs: Lab Results  Component Value Date   WBC 6.8 07/29/2021   HGB 13.6 07/29/2021   PLT 231 07/29/2021   NA 136 07/29/2021   K 4.1 07/29/2021   CL 103 07/29/2021   CO2 26 07/29/2021   GLUCOSE 79 07/29/2021   BUN 14 07/29/2021   CREATININE 0.81 07/29/2021   BILITOT 0.4 07/29/2021   AST 18 07/29/2021   ALT 14 07/29/2021   PROT 7.2 07/29/2021   CALCIUM 9.1 07/29/2021   GFRAA 97 04/18/2020    Speciality Comments: PLQ Eye Exam: 04/20/2020 WNL @ [place] Follow up in 9 months Patients last name is Ruberg   Patient stated she had PLQ Eye Exam on 08/21/2021 at  [facility], patient wil call to have eye exam faxed to office.  Procedures:  No procedures performed Allergies: Amoxicillin and Other   Assessment / Plan:     Visit Diagnoses: No diagnosis found.  Orders: No orders of the defined types were placed in this encounter.  No orders of the defined types were placed in this encounter.   Face-to-face time spent with patient was *** minutes. Greater than 50% of time was spent in counseling and coordination of care.  Follow-Up Instructions: No follow-ups on file.   Earnestine Mealing, CMA  Note - This record has been created using Editor, commissioning.  Chart creation errors have been sought, but may not always  have been located. Such creation errors do not reflect on  the standard of medical care.

## 2021-12-18 ENCOUNTER — Other Ambulatory Visit: Payer: Self-pay | Admitting: *Deleted

## 2021-12-18 ENCOUNTER — Ambulatory Visit: Payer: 59 | Admitting: Physician Assistant

## 2021-12-18 DIAGNOSIS — M3219 Other organ or system involvement in systemic lupus erythematosus: Secondary | ICD-10-CM

## 2021-12-18 DIAGNOSIS — G43001 Migraine without aura, not intractable, with status migrainosus: Secondary | ICD-10-CM

## 2021-12-18 DIAGNOSIS — Z79899 Other long term (current) drug therapy: Secondary | ICD-10-CM

## 2021-12-18 DIAGNOSIS — M25531 Pain in right wrist: Secondary | ICD-10-CM

## 2021-12-18 DIAGNOSIS — R5383 Other fatigue: Secondary | ICD-10-CM

## 2021-12-18 DIAGNOSIS — Z8719 Personal history of other diseases of the digestive system: Secondary | ICD-10-CM

## 2021-12-18 DIAGNOSIS — I7300 Raynaud's syndrome without gangrene: Secondary | ICD-10-CM

## 2021-12-18 DIAGNOSIS — E785 Hyperlipidemia, unspecified: Secondary | ICD-10-CM

## 2021-12-18 DIAGNOSIS — E559 Vitamin D deficiency, unspecified: Secondary | ICD-10-CM

## 2021-12-18 MED ORDER — HYDROXYCHLOROQUINE SULFATE 200 MG PO TABS: 200.0000 mg | ORAL_TABLET | Freq: Two times a day (BID) | ORAL | 0 refills | Status: DC

## 2021-12-18 NOTE — Telephone Encounter (Signed)
Patient requesting refill on PLQ.  Next Visit: 01/29/2022   Last Visit: 07/29/2021   Labs: 07/29/2021, CBC, CMP, urine protein, vitamin D, sed rate, complements are within normal limits.  Labs do not indicate a disease flare.   Eye exam: 08/21/2021 WNL   Current Dose per office note 07/29/2021: Plaquenil 200 mg 1 tablet by mouth twice daily   RX:YVOPF systemic lupus erythematosus with other organ involvement    Last Fill: 09/11/2021   Okay to refill Plaquenil?

## 2022-01-04 ENCOUNTER — Emergency Department: Payer: 59

## 2022-01-04 ENCOUNTER — Emergency Department
Admission: EM | Admit: 2022-01-04 | Discharge: 2022-01-04 | Disposition: A | Discharge status: Home / Self Care | Payer: 59 | Attending: Emergency Medicine | Admitting: Emergency Medicine

## 2022-01-04 ENCOUNTER — Other Ambulatory Visit: Payer: Self-pay

## 2022-01-04 DIAGNOSIS — R079 Chest pain, unspecified: Secondary | ICD-10-CM

## 2022-01-04 DIAGNOSIS — R0789 Other chest pain: Secondary | ICD-10-CM | POA: Diagnosis present

## 2022-01-04 LAB — TROPONIN I (HIGH SENSITIVITY)
Troponin I (High Sensitivity): 2 ng/L (ref <18)
Troponin I (High Sensitivity): 3 ng/L (ref <18)

## 2022-01-04 LAB — CBC
HCT: 39.4 % (ref 36.0–46.0)
Hemoglobin: 13.4 g/dL (ref 12.0–15.0)
MCH: 31.9 pg (ref 26.0–34.0)
MCHC: 34 g/dL (ref 30.0–36.0)
MCV: 93.8 fL (ref 80.0–100.0)
Platelets: 240 10*3/uL (ref 150–400)
RBC: 4.2 MIL/uL (ref 3.87–5.11)
RDW: 12.1 % (ref 11.5–15.5)
WBC: 9.4 10*3/uL (ref 4.0–10.5)
nRBC: 0 % (ref 0.0–0.2)

## 2022-01-04 LAB — D-DIMER, QUANTITATIVE: D-Dimer, Quant: <0.27 ug/mL-FEU (ref 0.00–0.50)

## 2022-01-04 LAB — BASIC METABOLIC PANEL
Anion gap: 7 (ref 5–15)
BUN: 11 mg/dL (ref 6–20)
CO2: 26 mmol/L (ref 22–32)
Calcium: 9.5 mg/dL (ref 8.9–10.3)
Chloride: 103 mmol/L (ref 98–111)
Creatinine, Ser: 0.87 mg/dL (ref 0.44–1.00)
GFR, Estimated: >60 mL/min (ref >60)
Glucose, Bld: 110 mg/dL — ABNORMAL HIGH (ref 70–99)
Potassium: 3.6 mmol/L (ref 3.5–5.1)
Sodium: 136 mmol/L (ref 135–145)

## 2022-01-04 LAB — POC URINE PREG, ED: Preg Test, Ur: NEGATIVE

## 2022-01-04 NOTE — ED Triage Notes (Signed)
Pt with central chest pain, shortness of breath, nausea and back pain since 2030. Pt is tearful, appears uncomfortable.

## 2022-01-04 NOTE — ED Provider Notes (Signed)
[facility] Provider Note    Event Date/Time   First MD Initiated Contact with Patient 01/04/22 [phone]     (approximate)  History   Chief Complaint: Chest Pain  HPI  Tammy Giles is a 36 y.o. female with a past medical history of lupus, presents emergency department for chest pain.  According to the patient around 8:30 PM tonight after eating a hot dog she developed pain in the center of her chest.  Patient states she thought the pain was more indigestion or reflux so she tried to wait it out at home but her symptoms did not go away so she came to the emergency department for evaluation.  Patient states she has had chest pain in the past, but no cardiac disease known to her.  Patient states she recently came off of her birth control several weeks ago no other estrogen therapy.  No history of blood clots.  Physical Exam   Triage Vital Signs: ED Triage Vitals  Enc Vitals Group     BP 01/04/22 0145 (!) 144/87     Pulse Rate 01/04/22 0145 91     Resp 01/04/22 0145 20     Temp 01/04/22 0145 97.8 F (36.6 C)     Temp Source 01/04/22 0145 Oral     SpO2 01/04/22 0145 100 %     Weight 01/04/22 0146 265 lb (120.2 kg)     Height 01/04/22 0146 5\' 6"  (1.676 m)     Head Circumference --      Peak Flow --      Pain Score 01/04/22 0145 6     Pain Loc --      Pain Edu? --      Excl. in GC? --     Most recent vital signs: Vitals:   01/04/22 0145  BP: (!) 144/87  Pulse: 91  Resp: 20  Temp: 97.8 F (36.6 C)  SpO2: 100%    General: Awake, no distress.  CV:  Good peripheral perfusion.  Regular rate and rhythm  Resp:  Normal effort.  Equal breath sounds bilaterally.  Abd:  No distention.  Soft, nontender.  No rebound or guarding.   ED Results / Procedures / Treatments   EKG  EKG viewed and interpreted by myself shows a normal sinus rhythm at 88 bpm with a narrow QRS, normal axis, normal intervals, no concerning ST changes.  RADIOLOGY  I have  reviewed and interpreted the chest x-ray images I do not see any obvious consolidation on my evaluation. Radiology has read the chest x-ray as negative  MEDICATIONS ORDERED IN ED: Medications - No data to display   IMPRESSION / MDM / ASSESSMENT AND PLAN / ED COURSE  I reviewed the triage vital signs and the nursing notes.  Patient's presentation is most consistent with acute presentation with potential threat to life or bodily function.  Patient presents emergency department for chest pain starting around 8:30 PM.  States it is somewhat improved now.  Patient has a history of lupus.  Patient was concerned that it could be her heart causing her pain.  No pleuritic pain.  Patient's EKG is reassuring, chest x-ray is clear.  We will check labs including cardiac enzymes and a D-dimer as a precaution.  Differential would include ACS, PE, anxiety, chest wall/musculoskeletal pain, reflux or esophagitis.  Patient's work-up shows a normal CBC, normal chemistry, negative pregnancy test.  Patient's troponin is negative.  D-dimer is negative.  Patient states she is feeling  much better.  Given the patient's reassuring work-up I believe the patient will be safe for discharge home with PCP follow-up.  Patient agreeable to plan.  FINAL CLINICAL IMPRESSION(S) / ED DIAGNOSES   Chest pain   Note:  This document was prepared using Dragon voice recognition software and may include unintentional dictation errors.   Minna Antis, MD 01/04/22 [phone]

## 2022-01-16 ENCOUNTER — Other Ambulatory Visit: Payer: Self-pay | Admitting: *Deleted

## 2022-01-16 DIAGNOSIS — M3219 Other organ or system involvement in systemic lupus erythematosus: Secondary | ICD-10-CM

## 2022-01-16 MED ORDER — HYDROXYCHLOROQUINE SULFATE 200 MG PO TABS: 200.0000 mg | ORAL_TABLET | Freq: Two times a day (BID) | ORAL | 0 refills | Status: DC

## 2022-01-16 NOTE — Telephone Encounter (Signed)
Refill request received via fax from [facility] for PLQ   Next Visit: 01/29/2022   Last Visit: 07/29/2021   Labs: 01/04/2022 Glucose 110   Eye exam: 08/21/2021 WNL   Current Dose per office note 07/29/2021: Plaquenil 200 mg 1 tablet by mouth twice daily   TD:HRCBU systemic lupus erythematosus with other organ involvement    Last Fill: 12/18/2021 (30 day supply)   Okay to refill Plaquenil?
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
## 2022-01-29 ENCOUNTER — Encounter: Payer: Self-pay | Admitting: Physician Assistant

## 2022-01-29 ENCOUNTER — Ambulatory Visit: Payer: 59 | Attending: Physician Assistant | Admitting: Physician Assistant

## 2022-01-29 VITALS — BP 111/84 | HR 72 | Resp 16 | Ht 65.0 in | Wt 263.6 lb

## 2022-01-29 DIAGNOSIS — M25531 Pain in right wrist: Secondary | ICD-10-CM

## 2022-01-29 DIAGNOSIS — Z79899 Other long term (current) drug therapy: Secondary | ICD-10-CM | POA: Diagnosis not present

## 2022-01-29 DIAGNOSIS — Z8719 Personal history of other diseases of the digestive system: Secondary | ICD-10-CM

## 2022-01-29 DIAGNOSIS — G43001 Migraine without aura, not intractable, with status migrainosus: Secondary | ICD-10-CM

## 2022-01-29 DIAGNOSIS — M3219 Other organ or system involvement in systemic lupus erythematosus: Secondary | ICD-10-CM | POA: Diagnosis not present

## 2022-01-29 DIAGNOSIS — I7300 Raynaud's syndrome without gangrene: Secondary | ICD-10-CM | POA: Diagnosis not present

## 2022-01-29 DIAGNOSIS — M545 Low back pain, unspecified: Secondary | ICD-10-CM

## 2022-01-29 DIAGNOSIS — R5383 Other fatigue: Secondary | ICD-10-CM

## 2022-01-29 DIAGNOSIS — E559 Vitamin D deficiency, unspecified: Secondary | ICD-10-CM

## 2022-01-29 DIAGNOSIS — E785 Hyperlipidemia, unspecified: Secondary | ICD-10-CM

## 2022-01-29 DIAGNOSIS — G8929 Other chronic pain: Secondary | ICD-10-CM

## 2022-01-29 DIAGNOSIS — Z131 Encounter for screening for diabetes mellitus: Secondary | ICD-10-CM

## 2022-01-30 LAB — COMPLETE METABOLIC PANEL WITH GFR
AG Ratio: 1 (calc) (ref 1.0–2.5)
ALT: 16 U/L (ref 6–29)
AST: 18 U/L (ref 10–30)
Albumin: 3.9 g/dL (ref 3.6–5.1)
Alkaline phosphatase (APISO): 78 U/L (ref 31–125)
BUN: 13 mg/dL (ref 7–25)
CO2: 28 mmol/L (ref 20–32)
Calcium: 9.3 mg/dL (ref 8.6–10.2)
Chloride: 101 mmol/L (ref 98–110)
Creat: 0.88 mg/dL (ref 0.50–0.97)
Globulin: 3.9 g/dL (calc) — ABNORMAL HIGH (ref 1.9–3.7)
Glucose, Bld: 78 mg/dL (ref 65–99)
Potassium: 4.4 mmol/L (ref 3.5–5.3)
Sodium: 137 mmol/L (ref 135–146)
Total Bilirubin: 0.4 mg/dL (ref 0.2–1.2)
Total Protein: 7.8 g/dL (ref 6.1–8.1)
eGFR: 88 mL/min/{1.73_m2} (ref >=60)

## 2022-01-30 LAB — CBC WITH DIFFERENTIAL/PLATELET
Absolute Monocytes: 482 cells/uL (ref 200–950)
Basophils Absolute: 37 cells/uL (ref 0–200)
Basophils Relative: 0.5 %
Eosinophils Absolute: 102 cells/uL (ref 15–500)
Eosinophils Relative: 1.4 %
HCT: 39.9 % (ref 35.0–45.0)
Hemoglobin: 13.6 g/dL (ref 11.7–15.5)
Lymphs Abs: 1796 cells/uL (ref 850–3900)
MCH: 32.5 pg (ref 27.0–33.0)
MCHC: 34.1 g/dL (ref 32.0–36.0)
MCV: 95.5 fL (ref 80.0–100.0)
MPV: 11.8 fL (ref 7.5–12.5)
Monocytes Relative: 6.6 %
Neutro Abs: 4884 cells/uL (ref 1500–7800)
Neutrophils Relative %: 66.9 %
Platelets: 214 10*3/uL (ref 140–400)
RBC: 4.18 10*6/uL (ref 3.80–5.10)
RDW: 11.8 % (ref 11.0–15.0)
Total Lymphocyte: 24.6 %
WBC: 7.3 10*3/uL (ref 3.8–10.8)

## 2022-01-30 LAB — SEDIMENTATION RATE: Sed Rate: 28 mm/h — ABNORMAL HIGH (ref 0–20)

## 2022-01-30 LAB — ANTI-DNA ANTIBODY, DOUBLE-STRANDED: ds DNA Ab: 2 IU/mL

## 2022-01-30 LAB — HEMOGLOBIN A1C
Hgb A1c MFr Bld: 5.1 % of total Hgb (ref <5.7)
Mean Plasma Glucose: 100 mg/dL
eAG (mmol/L): 5.5 mmol/L

## 2022-01-30 LAB — C3 AND C4
C3 Complement: 138 mg/dL (ref 83–193)
C4 Complement: 20 mg/dL (ref 15–57)

## 2022-01-30 LAB — ANTI-SMITH ANTIBODY: ENA SM Ab Ser-aCnc: 3.9 AI — AB

## 2022-01-30 LAB — PROTEIN / CREATININE RATIO, URINE
Creatinine, Urine: 84 mg/dL (ref 20–275)
Protein/Creat Ratio: 48 mg/g creat (ref 24–184)
Protein/Creatinine Ratio: 0.048 mg/mg creat (ref 0.024–0.184)
Total Protein, Urine: 4 mg/dL — ABNORMAL LOW (ref 5–24)

## 2022-01-30 LAB — RNP ANTIBODY: Ribonucleic Protein(ENA) Antibody, IgG: >8 AI — AB

## 2022-01-30 LAB — SJOGRENS SYNDROME-A EXTRACTABLE NUCLEAR ANTIBODY: SSA (Ro) (ENA) Antibody, IgG: 6.4 AI — AB

## 2022-01-30 NOTE — Progress Notes (Signed)
No proteinuria.  Globulin is borderline elevated-3.9. rest of CMP WNL.  We will continue to monitor.  CBC WNL.  ESR is borderline elevated-28.  Complements WNL.  Hgb A1c is within desirable range-5.1%.

## 2022-01-30 NOTE — Progress Notes (Signed)
dsDNA negative.  Smith, Ro antibody, and RNP remain positive.   Continue on current treatment regimen.

## 2022-02-04 ENCOUNTER — Ambulatory Visit: Payer: 59 | Admitting: Orthopaedic Surgery

## 2022-03-14 ENCOUNTER — Telehealth: Payer: Self-pay

## 2022-03-14 NOTE — Telephone Encounter (Signed)
Spoke with patient and scheduled OV for 03/17/22

## 2022-03-14 NOTE — Telephone Encounter (Signed)
-----  Message from Lorine Bears, NP sent at 03/13/2022 10:03 AM EST ----- OV. Thanks, Lumbar MRI in snapshot is from [place].  ----- Message ----- From: Casilda Carls, CMA Sent: 03/13/2022   9:25 AM EST To: Magnus Sinning, MD; Lorine Bears, NP   ----- Message ----- From: Gladstone Lions Sent: 03/13/2022   9:14 AM EST To: Casilda Carls, CMA  Referral for Dr. Ernestina Patches

## 2022-03-17 ENCOUNTER — Ambulatory Visit: Payer: 59 | Admitting: Physical Medicine and Rehabilitation

## 2022-03-17 DIAGNOSIS — M5442 Lumbago with sciatica, left side: Secondary | ICD-10-CM

## 2022-03-17 DIAGNOSIS — G8929 Other chronic pain: Secondary | ICD-10-CM

## 2022-03-17 DIAGNOSIS — M5441 Lumbago with sciatica, right side: Secondary | ICD-10-CM

## 2022-03-17 DIAGNOSIS — M5416 Radiculopathy, lumbar region: Secondary | ICD-10-CM | POA: Diagnosis not present

## 2022-03-17 DIAGNOSIS — M3219 Other organ or system involvement in systemic lupus erythematosus: Secondary | ICD-10-CM
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
## 2022-03-17 NOTE — Progress Notes (Unsigned)
Functional Pain Scale - descriptive words and definitions  Moderate (4)   Constantly aware of pain, can complete ADLs with modification/sleep marginally affected at times/passive distraction is of no use, but active distraction gives some relief. Moderate range order  Average Pain 5-6  Lower back pain on both sides that radiates into the buttocks area

## 2022-03-18 ENCOUNTER — Ambulatory Visit (INDEPENDENT_AMBULATORY_CARE_PROVIDER_SITE_OTHER): Payer: 59

## 2022-03-18 ENCOUNTER — Ambulatory Visit (INDEPENDENT_AMBULATORY_CARE_PROVIDER_SITE_OTHER): Payer: 59 | Admitting: Physician Assistant

## 2022-03-18 ENCOUNTER — Encounter: Payer: Self-pay | Admitting: Physician Assistant

## 2022-03-18 ENCOUNTER — Encounter: Payer: Self-pay | Admitting: Physical Medicine and Rehabilitation

## 2022-03-18 DIAGNOSIS — M25572 Pain in left ankle and joints of left foot: Secondary | ICD-10-CM

## 2022-03-18 NOTE — Progress Notes (Signed)
Office Visit Note   Patient: Tammy Giles           Date of Birth: 04/12/1985           MRN: [id number] Visit Date: 03/18/2022              Requested by: Janie Morning, DO [address] [place],  [place] [id number] PCP: Janie Morning, DO   Assessment & Plan: Visit Diagnoses:  1. Pain in left ankle and joints of left foot     Plan: Patient is 3 months status post injury to her left ankle.  She was in [place] with her husband and she actually stepped into a hole on the right ankle.  This caused her to fall.  While the right was okay she did the next day developed pain and inability to bear weight on the medial side of her left ankle.  She has treated this symptomatically with a over-the-counter elastic brace.  She is concerned because she is 3 months since the injury and she still has some pain.  X-rays do demonstrate a subacute fracture of the medial malleolus with some fragmentation.  This coordinates to where all of her pain is.  She has no lateral or proximal pain.  Given the time out I recommend an ASO brace and some physical therapy.  If she continued to have problems could order an MRI to evaluate her deltoid  Follow-Up Instructions: No follow-ups on file.   Orders:  Orders Placed This Encounter  Procedures   XR Ankle Complete Left   No orders of the defined types were placed in this encounter.     Procedures: No procedures performed   Clinical Data: No additional findings.   Subjective: Chief Complaint  Patient presents with   Left Ankle - Pain    HPI patient is a pleasant 37 year old woman who comes in today complaining of left ankle pain and instability after an ankle sprain in October.  She thought it would get better but she is wearing an ankle support and still has quite a bit of pain in the ankle.  She has a history of an Achilles tendon repair in 2016  Review of Systems  All other systems reviewed and are negative.    Objective: Vital  Signs: There were no vitals taken for this visit.  Physical Exam Constitutional:      Appearance: Normal appearance.  Pulmonary:     Effort: Pulmonary effort is normal.  Skin:    General: Skin is warm and dry.  Neurological:     Mental Status: She is alert.     Ortho Exam Examination of her left ankle she has mild soft tissue swelling she has good dorsiflexion plantarflexion eversion and inversion.  No tenderness laterally no tenderness proximally she does have tenderness over the medial malleolus sensation is intact compartments are soft and nontender Specialty Comments:  INDICATION: Low back pain, unspecified back pain laterality, unspecified chronicity, unspecified whether sciatica present   COMPARISON: Plain films of the lumbar spine 11/07/2021   TECHNIQUE: MRI SPINE LUMBAR WO IV CONTRAST.   FINDINGS:  # Osseous structures: Vertebral body heights are maintained. No acute fracture. No pars defect appreciated. No destructive bony changes.  #  Alignment:No significant subluxation.  #  Conus medullaris/cauda equina: Conus appears normal and terminates at L1-2.   #  Lower thoracic spine: No significant spinal canal or foraminal stenosis.   #  T12-L1: Preservation of disc height. No focal disc herniation, significant  spinal canal or foraminal stenosis.  #  L1-L2: Preservation of disc height. No focal disc herniation, significant spinal canal or foraminal stenosis.  #  L2-L3: Preservation of disc height. No focal disc herniation, significant spinal canal or foraminal stenosis.  #  L3-L4: Preservation of disc height. No focal disc herniation, significant spinal canal or foraminal stenosis.  #  L4-L5: Preservation of disc height. No focal disc herniation, significant spinal canal or foraminal stenosis.  #  L5-S1: Preservation of disc height. No focal disc herniation, significant spinal canal or foraminal stenosis.   #  Paraspinal tissues: Unremarkable   #  Contrast: None given.   #   Additional comments: None.    IMPRESSION:  1. No focal disc herniation or significant stenosis. No nerve root impingement appreciated.  2.  No acute bony abnormality.   Electronically Signed by: Eddie Dibbles, MD on 03/07/2022 1:08 PM  Imaging: No results found.   PMFS History: Patient Active Problem List   Diagnosis Date Noted   Pain in left ankle and joints of left foot 03/18/2022   Cervical strain 12/11/2021   Class 2 obesity without serious comorbidity with body mass index (BMI) of 35.0 to 35.9 in adult 02/13/2017   Raynaud's syndrome without gangrene 02/13/2017   High risk medication use 02/13/2017   Dyslipidemia 12/12/2015   Vitamin D deficiency 12/12/2015   Annual physical exam 12/11/2015   Migraine without aura and with status migrainosus, not intractable 12/11/2015   Obesity (BMI 35.0-39.9 without comorbidity) 12/11/2015   Encounter for long-term (current) use of high-risk medication 04/26/2014   Abnormal Pap smear of cervix 06/16/2013    Class: History of   Lupus (systemic lupus erythematosus) ([place]) 10/22/2011   Past Medical History:  Diagnosis Date   Abnormal Pap smear of cervix    LGSIL 2014 & 2015, ASCUS HPV HR+ 2016, ASCUS HPV HR+ 2017, 12-26-16 LGSIL   Achilles rupture, left 05/13/2014   Irregular periods    has IUD   Lupus ([place])    Migraines    STD (sexually transmitted disease)    HSV2    Family History  Problem Relation Age of Onset   Hypertension Mother    Hypertension Father    Depression Father    Prostate cancer Father    Heart disease Father    Thyroid disease Sister     Past Surgical History:  Procedure Laterality Date   ACHILLES TENDON SURGERY Left 05/24/2014   Procedure: LEFT ACHILLES TENDON REPAIR;  Surgeon: Leandrew Koyanagi, MD;  Location: [facility];  Service: Orthopedics;  Laterality: Left;   COLPOSCOPY     2014, 2015, 2016, 2017 LGSIL, 2018   INTRAUTERINE DEVICE (IUD) INSERTION     insertion 07-18-16   WISDOM TOOTH  EXTRACTION     Social History   Occupational History    Employer: [employer]  Tobacco Use   Smoking status: Never    Passive exposure: Never   Smokeless tobacco: Never  Vaping Use   Vaping Use: Never used  Substance and Sexual Activity   Alcohol use: Yes    Alcohol/week: 3.0 standard drinks of alcohol    Types: 3 Standard drinks or equivalent per week    Comment: occ   Drug use: No   Sexual activity: Yes    Partners: Male    Birth control/protection: I.U.D.

## 2022-04-07 ENCOUNTER — Other Ambulatory Visit: Payer: Self-pay

## 2022-04-07 ENCOUNTER — Ambulatory Visit (INDEPENDENT_AMBULATORY_CARE_PROVIDER_SITE_OTHER): Payer: 59 | Admitting: Physical Therapy

## 2022-04-07 ENCOUNTER — Ambulatory Visit: Payer: 59 | Admitting: Physical Therapy

## 2022-04-07 ENCOUNTER — Encounter: Payer: Self-pay | Admitting: Physical Therapy

## 2022-04-07 DIAGNOSIS — M25672 Stiffness of left ankle, not elsewhere classified: Secondary | ICD-10-CM | POA: Diagnosis not present

## 2022-04-07 DIAGNOSIS — R2681 Unsteadiness on feet: Secondary | ICD-10-CM | POA: Diagnosis not present

## 2022-04-07 DIAGNOSIS — R600 Localized edema: Secondary | ICD-10-CM

## 2022-04-07 DIAGNOSIS — M25572 Pain in left ankle and joints of left foot: Secondary | ICD-10-CM

## 2022-04-07 DIAGNOSIS — M6281 Muscle weakness (generalized): Secondary | ICD-10-CM | POA: Diagnosis not present

## 2022-04-07 NOTE — Therapy (Signed)
OUTPATIENT PHYSICAL THERAPY LOWER EXTREMITY EVALUATION   Patient Name: Tammy Giles MRN: [id number] DOB:Feb 02, 1986, 37 y.o., female Today's Date: 04/07/2022  END OF SESSION:  PT End of Session - 04/07/22 1205     Visit Number 1    Number of Visits 6    Date for PT Re-Evaluation 05/19/22    Authorization Type [employer] 20% coinsurance; 60 visit limit    PT Start Time 1108    PT Stop Time 1145    PT Time Calculation (min) 37 min    Activity Tolerance Patient tolerated treatment well    Behavior During Therapy [facility] for tasks assessed/performed             Past Medical History:  Diagnosis Date   Abnormal Pap smear of cervix    LGSIL 2014 & 2015, ASCUS HPV HR+ 2016, ASCUS HPV HR+ 2017, 12-26-16 LGSIL   Achilles rupture, left 05/13/2014   Irregular periods    has IUD   Lupus ([place])    Migraines    STD (sexually transmitted disease)    HSV2   Past Surgical History:  Procedure Laterality Date   ACHILLES TENDON SURGERY Left 05/24/2014   Procedure: LEFT ACHILLES TENDON REPAIR;  Surgeon: Leandrew Koyanagi, MD;  Location: [facility];  Service: Orthopedics;  Laterality: Left;   COLPOSCOPY     2014, 2015, 2016, 2017 LGSIL, 2018   INTRAUTERINE DEVICE (IUD) INSERTION     insertion 07-18-16   WISDOM TOOTH EXTRACTION     Patient Active Problem List   Diagnosis Date Noted   Pain in left ankle and joints of left foot 03/18/2022   Cervical strain 12/11/2021   Class 2 obesity without serious comorbidity with body mass index (BMI) of 35.0 to 35.9 in adult 02/13/2017   Raynaud's syndrome without gangrene 02/13/2017   High risk medication use 02/13/2017   Dyslipidemia 12/12/2015   Vitamin D deficiency 12/12/2015   Annual physical exam 12/11/2015   Migraine without aura and with status migrainosus, not intractable 12/11/2015   Obesity (BMI 35.0-39.9 without comorbidity) 12/11/2015   Encounter for long-term (current) use of high-risk medication 04/26/2014   Abnormal Pap smear of  cervix 06/16/2013    Class: History of   Lupus (systemic lupus erythematosus) ([place]) 10/22/2011    PCP: Janie Morning, DO  REFERRING PROVIDER: Persons, Bevely Palmer, PA  REFERRING DIAG: [phone] (ICD-10-CM) - Pain in left ankle and joints of left foot  THERAPY DIAG:  Pain in left ankle and joints of left foot - Plan: PT plan of care cert/re-cert  Stiffness of left ankle, not elsewhere classified - Plan: PT plan of care cert/re-cert  Unsteadiness on feet - Plan: PT plan of care cert/re-cert  Muscle weakness (generalized) - Plan: PT plan of care cert/re-cert  Localized edema - Plan: PT plan of care cert/re-cert  Rationale for Evaluation and Treatment: Rehabilitation  ONSET DATE: Oct 2023  SUBJECTIVE:   SUBJECTIVE STATEMENT: Patient is 3 months status post injury to her left ankle.  She was in [place] with her husband and she actually stepped into a hole on the right ankle.  This caused her to fall.  While the right was okay she did the next day developed pain and inability to bear weight on the medial side of her left ankle which had recently recovered from a sprain.  She continued to have pain and recent x-rays demonstrated subacute fx.  PERTINENT HISTORY: Lupus, obesity, raynaud's, migraines, Lt achilles tendon repair  PAIN:  Are you  having pain? Yes: NPRS scale: 0 currently, up to 4-5/10 Pain location: Lt ankle medially Pain description: sharp, dull Aggravating factors: standing/walking too long, stairs Relieving factors: wearing the brace, ice, tylenol  PRECAUTIONS: None  WEIGHT BEARING RESTRICTIONS: No  FALLS:  Has patient fallen in last 6 months? Yes. 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  PLOF: Independent and Leisure: social events, strength  training/cardio (has equipment at home)  PATIENT GOALS: return to exercise  NEXT MD VISIT: 04/16/22  OBJECTIVE:   DIAGNOSTIC FINDINGS:  X-Rays: subacute fracture of the medial malleolus with some fragmentation  PATIENT SURVEYS:  04/07/22: FOTO 64 (predicted 25)  COGNITION: Overall cognitive status: Within functional limits for tasks assessed     SENSATION: 04/07/22:WFL  EDEMA:  04/07/22: Rt ankle with increased lateral swelling due to recent sprain of Rt ankle  PALPATION: 04/07/22: tenderness medial malleolus on Lt  LOWER EXTREMITY ROM:  A/P ROM Right Eval (Active only) Left eval  Ankle dorsiflexion 0 -10/-8  Ankle plantarflexion 67 65  Ankle inversion 30 16 (P)/30  Ankle eversion 25 25/30   (Blank rows = not tested)  LOWER EXTREMITY MMT:  MMT Right eval Left eval  Ankle dorsiflexion 5/5 5/5  Ankle plantarflexion 4/5 2/5  Ankle inversion 5/5 3+/5  Ankle eversion 5/5 4+/5   (Blank rows = not tested)  FUNCTIONAL TESTS:  04/07/22:  SLS: RLE: 16 sec; LLE: 1 sec  GAIT: 04/07/22: Comments: no significant signs of instability on level surfaces; pt amb with ASO on Lt   TODAY'S TREATMENT:                                                                                                                              DATE:  04/07/22 See HEP - performed trial reps with min cues for comprehension    PATIENT EDUCATION:  Education details: HEP Person educated: Patient Education method: Explanation, Demonstration, and Handouts Education comprehension: verbalized understanding, returned demonstration, and needs further education  HOME EXERCISE PROGRAM: Access Code: [email or web address] URL: [email or web address] Date: 04/07/2022 Prepared by: Faustino Congress  Exercises - Seated Ankle Inversion with Resistance  - 2 x daily - 7 x weekly - 1 sets - 20 reps - Seated Ankle Eversion with Resistance  - 2 x daily - 7 x weekly - 1 sets - 20 reps - Heel Raises with Counter  Support  - 2 x daily - 7 x weekly - 1 sets - 20 reps - Standing Single Leg Stance with Unilateral Counter Support  - 2 x daily - 7 x weekly - 1 sets - 5 reps - 10-15 sec hold  ASSESSMENT:  CLINICAL IMPRESSION: Patient is a 37 y.o. female who was seen today for physical therapy evaluation and treatment for Lt ankle  pain following injury in October 2023.  She demonstrates decreased strength and balance, ROM deficits including hypermobility in some motions, and continued pain affecting functional mobility.  She will benefit from PT to address deficits listed.   OBJECTIVE IMPAIRMENTS: decreased balance, difficulty walking, decreased ROM, decreased strength, increased edema, and pain.   ACTIVITY LIMITATIONS: sitting, standing, squatting, stairs, and locomotion level  PARTICIPATION LIMITATIONS: meal prep, cleaning, laundry, driving, shopping, community activity, and occupation  PERSONAL FACTORS: Past/current experiences, Time since onset of injury/illness/exacerbation, and 3+ comorbidities: Lupus, obesity, raynaud's, migraines, Lt achilles tendon repair  are also affecting patient's functional outcome.   REHAB POTENTIAL: Good  CLINICAL DECISION MAKING: Evolving/moderate complexity  EVALUATION COMPLEXITY: Moderate   GOALS: Goals reviewed with patient? Yes  SHORT TERM GOALS: Target date: 04/28/2022  Independent with initial HEP Goal status: INITIAL   LONG TERM GOALS: Target date: 05/19/2022  Independent with final HEP Goal status: INITIAL  2.  FOTO score improved to 71 Goal status: INITIAL  3.  Lt ankle strength improved to 5/5 inversion and eversion for improved function and mobility Goal status: INIITAL  4.  Report pain < 3/10 with standing and walking activities for improved function Goal status: INITIAL  5.  Improved Lt ankle strength and balance by performing SLS > 15 sec Goal status: INITIAL    PLAN:  PT FREQUENCY: 1x/week  PT DURATION: 6 weeks  PLANNED  INTERVENTIONS: Therapeutic exercises, Therapeutic activity, Neuromuscular re-education, Balance training, Gait training, Patient/Family education, Self Care, Joint mobilization, Stair training, Aquatic Therapy, Dry Needling, Electrical stimulation, Cryotherapy, Moist heat, Taping, Ultrasound, Ionotophoresis 58m/ml Dexamethasone, Manual therapy, and Re-evaluation  PLAN FOR NEXT SESSION: review HEP, progress balance activities (compliant surface), bil ankle strengthening   SLaureen Abrahams PT, DPT 04/07/22 12:06 PM

## 2022-04-16 ENCOUNTER — Ambulatory Visit: Payer: 59 | Admitting: Physician Assistant

## 2022-04-18 ENCOUNTER — Encounter: Payer: Self-pay | Admitting: Physical Therapy

## 2022-04-18 ENCOUNTER — Ambulatory Visit (INDEPENDENT_AMBULATORY_CARE_PROVIDER_SITE_OTHER): Payer: 59 | Admitting: Physical Therapy

## 2022-04-18 DIAGNOSIS — M25672 Stiffness of left ankle, not elsewhere classified: Secondary | ICD-10-CM

## 2022-04-18 DIAGNOSIS — R2681 Unsteadiness on feet: Secondary | ICD-10-CM | POA: Diagnosis not present

## 2022-04-18 DIAGNOSIS — R600 Localized edema: Secondary | ICD-10-CM

## 2022-04-18 DIAGNOSIS — M25572 Pain in left ankle and joints of left foot: Secondary | ICD-10-CM

## 2022-04-18 DIAGNOSIS — M6281 Muscle weakness (generalized): Secondary | ICD-10-CM | POA: Diagnosis not present

## 2022-04-18 NOTE — Therapy (Signed)
OUTPATIENT PHYSICAL THERAPY LOWER EXTREMITY TREATMENT   Patient Name: Tammy Giles MRN: [id number] DOB:Jan 13, 1986, 37 y.o., female Today's Date: 04/18/2022  END OF SESSION:  PT End of Session - 04/18/22 0813     Visit Number 2    Number of Visits 6    Date for PT Re-Evaluation 05/19/22    Authorization Type [employer] 20% coinsurance; 60 visit limit    PT Start Time 0810    PT Stop Time 0841    PT Time Calculation (min) 31 min    Activity Tolerance Patient tolerated treatment well    Behavior During Therapy [facility] for tasks assessed/performed              Past Medical History:  Diagnosis Date   Abnormal Pap smear of cervix    LGSIL 2014 & 2015, ASCUS HPV HR+ 2016, ASCUS HPV HR+ 2017, 12-26-16 LGSIL   Achilles rupture, left 05/13/2014   Irregular periods    has IUD   Lupus ([place])    Migraines    STD (sexually transmitted disease)    HSV2   Past Surgical History:  Procedure Laterality Date   ACHILLES TENDON SURGERY Left 05/24/2014   Procedure: LEFT ACHILLES TENDON REPAIR;  Surgeon: Leandrew Koyanagi, MD;  Location: [facility];  Service: Orthopedics;  Laterality: Left;   COLPOSCOPY     2014, 2015, 2016, 2017 LGSIL, 2018   INTRAUTERINE DEVICE (IUD) INSERTION     insertion 07-18-16   WISDOM TOOTH EXTRACTION     Patient Active Problem List   Diagnosis Date Noted   Pain in left ankle and joints of left foot 03/18/2022   Cervical strain 12/11/2021   Class 2 obesity without serious comorbidity with body mass index (BMI) of 35.0 to 35.9 in adult 02/13/2017   Raynaud's syndrome without gangrene 02/13/2017   High risk medication use 02/13/2017   Dyslipidemia 12/12/2015   Vitamin D deficiency 12/12/2015   Annual physical exam 12/11/2015   Migraine without aura and with status migrainosus, not intractable 12/11/2015   Obesity (BMI 35.0-39.9 without comorbidity) 12/11/2015   Encounter for long-term (current) use of high-risk medication 04/26/2014   Abnormal Pap smear of  cervix 06/16/2013    Class: History of   Lupus (systemic lupus erythematosus) ([place]) 10/22/2011    PCP: Janie Morning, DO  REFERRING PROVIDER: Persons, Bevely Palmer, PA  REFERRING DIAG: [phone] (ICD-10-CM) - Pain in left ankle and joints of left foot  THERAPY DIAG:  Pain in left ankle and joints of left foot  Stiffness of left ankle, not elsewhere classified  Unsteadiness on feet  Muscle weakness (generalized)  Localized edema  Rationale for Evaluation and Treatment: Rehabilitation  ONSET DATE: Oct 2023  SUBJECTIVE:   SUBJECTIVE STATEMENT: She was going down the stairs two days ago and had sudden pain, has been more uncomfortable since that time.  Pain is about the same since that time.  PERTINENT HISTORY: Lupus, obesity, raynaud's, migraines, Lt achilles tendon repair  PAIN:  Are you having pain? Yes: NPRS scale: 0 currently, up to 5-6/10 Pain location: Lt ankle medially Pain description: sharp, dull Aggravating factors: standing/walking too long, stairs Relieving factors: wearing the brace, ice, tylenol  PRECAUTIONS: None  WEIGHT BEARING RESTRICTIONS: No  FALLS:  Has patient fallen in last 6 months? Yes. 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  PLOF: Independent and Leisure: social events, strength training/cardio (has equipment at home)  PATIENT GOALS: return to exercise  NEXT MD VISIT: 04/16/22  OBJECTIVE:   DIAGNOSTIC FINDINGS:  X-Rays: subacute fracture of the medial malleolus with some fragmentation  PATIENT SURVEYS:  04/07/22: FOTO 64 (predicted 92)  COGNITION: Overall cognitive status: Within functional limits for tasks assessed     SENSATION: 04/07/22:WFL  EDEMA:  04/07/22: Rt ankle with increased lateral swelling due to recent  sprain of Rt ankle  PALPATION: 04/07/22: tenderness medial malleolus on Lt  LOWER EXTREMITY ROM:  A/P ROM Right Eval (Active only) Left eval  Ankle dorsiflexion 0 -10/-8  Ankle plantarflexion 67 65  Ankle inversion 30 16 (P)/30  Ankle eversion 25 25/30   (Blank rows = not tested)  LOWER EXTREMITY MMT:  MMT Right eval Left eval  Ankle dorsiflexion 5/5 5/5  Ankle plantarflexion 4/5 2/5  Ankle inversion 5/5 3+/5  Ankle eversion 5/5 4+/5   (Blank rows = not tested)  FUNCTIONAL TESTS:  04/07/22:  SLS: RLE: 16 sec; LLE: 1 sec  GAIT: 04/07/22: Comments: no significant signs of instability on level surfaces; pt amb with ASO on Lt   TODAY'S TREATMENT:                                                                                                                              DATE:  04/18/22 TherEx Bike L3 x 6 min INV/EV bil x 20 reps L3 band Calf raises x 20 reps; light UE support SLS bil 3x15 sec; increased UE support needed on Lt SLS on compliant surface 3x15 sec bil; UE support needed on Lt Side shuffle (with calf raised and toe raises) x 3 laps at treadmill handrail Slant board stretch 3x30 sec [place] 10' x 3 each direction Sidestepping with L3 band 10' x 3 each direction Demonstrated modification to squats with heels elevated to decrease stress on ankles and back; pt tolerated well and encouraged working on getting back to gym for regular exercise  04/07/22 See HEP - performed trial reps with min cues for comprehension    PATIENT EDUCATION:  Education details: HEP Person educated: Patient Education method: Consulting civil engineer, Demonstration, and Handouts Education comprehension: verbalized understanding, returned demonstration, and needs further education  HOME EXERCISE PROGRAM: Access Code: [email or web address] URL: [email or web address] Date: 04/07/2022 Prepared by: Faustino Congress  Exercises - Seated Ankle Inversion with Resistance  - 2 x daily - 7 x weekly  - 1 sets - 20 reps - Seated Ankle Eversion with Resistance  - 2 x daily - 7 x weekly - 1 sets - 20 reps - Heel Raises with Counter Support  - 2 x daily - 7 x weekly - 1 sets - 20 reps - Standing Single Leg Stance with Unilateral Counter Support  - 2 x daily - 7 x weekly - 1 sets - 5 reps - 10-15 sec hold  ASSESSMENT:  CLINICAL IMPRESSION: Pt tolerated session well today, demonstrating improved SLS on LLE to at least 5-10 sec without UE support.  Overall progressing well with PT and will continue to benefit from PT to maximize function.  OBJECTIVE IMPAIRMENTS: decreased balance, difficulty walking, decreased ROM, decreased strength, increased edema, and pain.   ACTIVITY LIMITATIONS: sitting, standing, squatting, stairs, and locomotion level  PARTICIPATION LIMITATIONS: meal prep, cleaning, laundry, driving, shopping, community activity, and occupation  PERSONAL FACTORS: Past/current experiences, Time since onset of injury/illness/exacerbation, and 3+ comorbidities: Lupus, obesity, raynaud's, migraines, Lt achilles tendon repair  are also affecting patient's functional outcome.   REHAB POTENTIAL: Good  CLINICAL DECISION MAKING: Evolving/moderate complexity  EVALUATION COMPLEXITY: Moderate   GOALS: Goals reviewed with patient? Yes  SHORT TERM GOALS: Target date: 04/28/2022  Independent with initial HEP Goal status: MET 04/18/22   LONG TERM GOALS: Target date: 05/19/2022  Independent with final HEP Goal status: INITIAL  2.  FOTO score improved to 71 Goal status: INITIAL  3.  Lt ankle strength improved to 5/5 inversion and eversion for improved function and mobility Goal status: INIITAL  4.  Report pain < 3/10 with standing and walking activities for improved function Goal status: INITIAL  5.  Improved Lt ankle strength and balance by performing SLS > 15 sec Goal status: INITIAL    PLAN:  PT FREQUENCY: 1x/week  PT DURATION: 6 weeks  PLANNED INTERVENTIONS: Therapeutic  exercises, Therapeutic activity, Neuromuscular re-education, Balance training, Gait training, Patient/Family education, Self Care, Joint mobilization, Stair training, Aquatic Therapy, Dry Needling, Electrical stimulation, Cryotherapy, Moist heat, Taping, Ultrasound, Ionotophoresis '4mg'$ /ml Dexamethasone, Manual therapy, and Re-evaluation  PLAN FOR NEXT SESSION: progress HEP PRN, progress balance activities (compliant surface), bil ankle strengthening   Laureen Abrahams, PT, DPT 04/18/22 8:54 AM

## 2022-04-21 NOTE — Progress Notes (Deleted)
Office Visit Note  Patient: Tammy Giles             Date of Birth: 01-06-1986           MRN: [id number]             PCP: Janie Morning, DO Referring: Janie Morning, DO Visit Date: 05/02/2022 Occupation: '@GUAROCC'$ @  Subjective:  No chief complaint on file.   History of Present Illness: Tammy Giles is a 37 y.o. female ***     Activities of Daily Living:  Patient reports morning stiffness for *** {minute/hour:19697}.   Patient {ACTIONS;DENIES/REPORTS:21021675::"Denies"} nocturnal pain.  Difficulty dressing/grooming: {ACTIONS;DENIES/REPORTS:21021675::"Denies"} Difficulty climbing stairs: {ACTIONS;DENIES/REPORTS:21021675::"Denies"} Difficulty getting out of chair: {ACTIONS;DENIES/REPORTS:21021675::"Denies"} Difficulty using hands for taps, buttons, cutlery, and/or writing: {ACTIONS;DENIES/REPORTS:21021675::"Denies"}  No Rheumatology ROS completed.   PMFS History:  Patient Active Problem List   Diagnosis Date Noted   Pain in left ankle and joints of left foot 03/18/2022   Cervical strain 12/11/2021   Class 2 obesity without serious comorbidity with body mass index (BMI) of 35.0 to 35.9 in adult 02/13/2017   Raynaud's syndrome without gangrene 02/13/2017   High risk medication use 02/13/2017   Dyslipidemia 12/12/2015   Vitamin D deficiency 12/12/2015   Annual physical exam 12/11/2015   Migraine without aura and with status migrainosus, not intractable 12/11/2015   Obesity (BMI 35.0-39.9 without comorbidity) 12/11/2015   Encounter for long-term (current) use of high-risk medication 04/26/2014   Abnormal Pap smear of cervix 06/16/2013    Class: History of   Lupus (systemic lupus erythematosus) ([place]) 10/22/2011    Past Medical History:  Diagnosis Date   Abnormal Pap smear of cervix    LGSIL 2014 & 2015, ASCUS HPV HR+ 2016, ASCUS HPV HR+ 2017, 12-26-16 LGSIL   Achilles rupture, left 05/13/2014   Irregular periods    has IUD   Lupus ([place])    Migraines    STD  (sexually transmitted disease)    HSV2    Family History  Problem Relation Age of Onset   Hypertension Mother    Hypertension Father    Depression Father    Prostate cancer Father    Heart disease Father    Thyroid disease Sister    Past Surgical History:  Procedure Laterality Date   ACHILLES TENDON SURGERY Left 05/24/2014   Procedure: LEFT ACHILLES TENDON REPAIR;  Surgeon: Leandrew Koyanagi, MD;  Location: [facility];  Service: Orthopedics;  Laterality: Left;   COLPOSCOPY     2014, 2015, 2016, 2017 LGSIL, 2018   INTRAUTERINE DEVICE (IUD) INSERTION     insertion 07-18-16   WISDOM TOOTH EXTRACTION     Social History   Social History Narrative   Not on file   Immunization History  Administered Date(s) Administered   Influenza Split 11/20/2014   Influenza,inj,Quad PF,6+ Mos 12/11/2015   PFIZER(Purple Top)SARS-COV-2 Vaccination 07/17/2019, 08/07/2019, 01/11/2020   Tdap 02/18/2007, 12/26/2016     Objective: Vital Signs: There were no vitals taken for this visit.   Physical Exam   Musculoskeletal Exam: ***  CDAI Exam: CDAI Score: -- Patient Global: --; Provider Global: -- Swollen: --; Tender: -- Joint Exam 05/02/2022   No joint exam has been documented for this visit   There is currently no information documented on the homunculus. Go to the Rheumatology activity and complete the homunculus joint exam.  Investigation: No additional findings.  Imaging: No results found.  Recent Labs: Lab Results  Component Value Date   WBC 7.3 01/29/2022  HGB 13.6 01/29/2022   PLT 214 01/29/2022   NA 137 01/29/2022   K 4.4 01/29/2022   CL 101 01/29/2022   CO2 28 01/29/2022   GLUCOSE 78 01/29/2022   BUN 13 01/29/2022   CREATININE 0.88 01/29/2022   BILITOT 0.4 01/29/2022   AST 18 01/29/2022   ALT 16 01/29/2022   PROT 7.8 01/29/2022   CALCIUM 9.3 01/29/2022   GFRAA 97 04/18/2020    Speciality Comments: PLQ Eye Exam: 08/21/2021 WNL @ [facility] Follow up in  12 months  Procedures:  No procedures performed Allergies: Amoxicillin, Other, and Ibuprofen   Assessment / Plan:     Visit Diagnoses: Other systemic lupus erythematosus with other organ involvement ([place])  High risk medication use  Raynaud's syndrome without gangrene  Other fatigue  Vitamin D deficiency  Migraine   History of gastroesophageal reflux (GERD)  Dyslipidemia  Orders: No orders of the defined types were placed in this encounter.  No orders of the defined types were placed in this encounter.   Face-to-face time spent with patient was *** minutes. Greater than 50% of time was spent in counseling and coordination of care.  Follow-Up Instructions: No follow-ups on file.   Ofilia Neas, PA-C  Note - This record has been created using Dragon software.  Chart creation errors have been sought, but may not always  have been located. Such creation errors do not reflect on  the standard of medical care.

## 2022-04-25 ENCOUNTER — Ambulatory Visit: Payer: 59 | Admitting: Physical Therapy

## 2022-04-25 ENCOUNTER — Encounter: Payer: Self-pay | Admitting: Physical Therapy

## 2022-04-25 DIAGNOSIS — M25572 Pain in left ankle and joints of left foot: Secondary | ICD-10-CM | POA: Diagnosis not present

## 2022-04-25 DIAGNOSIS — R600 Localized edema: Secondary | ICD-10-CM | POA: Diagnosis not present

## 2022-04-25 DIAGNOSIS — R2681 Unsteadiness on feet: Secondary | ICD-10-CM

## 2022-04-25 DIAGNOSIS — M25672 Stiffness of left ankle, not elsewhere classified: Secondary | ICD-10-CM

## 2022-04-25 DIAGNOSIS — M6281 Muscle weakness (generalized): Secondary | ICD-10-CM

## 2022-04-25 NOTE — Therapy (Signed)
OUTPATIENT PHYSICAL THERAPY LOWER EXTREMITY TREATMENT   Patient Name: Tammy Giles MRN: [id number] DOB:Sep 20, 1985, 37 y.o., female Today's Date: 04/25/2022  END OF SESSION:  PT End of Session - 04/25/22 0815     Visit Number 3    Number of Visits 6    Date for PT Re-Evaluation 05/19/22    Authorization Type [employer] 20% coinsurance; 60 visit limit    PT Start Time 0815   pt late again   PT Stop Time 0841    PT Time Calculation (min) 26 min    Activity Tolerance Patient tolerated treatment well    Behavior During Therapy [facility] for tasks assessed/performed               Past Medical History:  Diagnosis Date   Abnormal Pap smear of cervix    LGSIL 2014 & 2015, ASCUS HPV HR+ 2016, ASCUS HPV HR+ 2017, 12-26-16 LGSIL   Achilles rupture, left 05/13/2014   Irregular periods    has IUD   Lupus ([place])    Migraines    STD (sexually transmitted disease)    HSV2   Past Surgical History:  Procedure Laterality Date   ACHILLES TENDON SURGERY Left 05/24/2014   Procedure: LEFT ACHILLES TENDON REPAIR;  Surgeon: Leandrew Koyanagi, MD;  Location: [place];  Service: Orthopedics;  Laterality: Left;   COLPOSCOPY     2014, 2015, 2016, 2017 LGSIL, 2018   INTRAUTERINE DEVICE (IUD) INSERTION     insertion 07-18-16   WISDOM TOOTH EXTRACTION     Patient Active Problem List   Diagnosis Date Noted   Pain in left ankle and joints of left foot 03/18/2022   Cervical strain 12/11/2021   Class 2 obesity without serious comorbidity with body mass index (BMI) of 35.0 to 35.9 in adult 02/13/2017   Raynaud's syndrome without gangrene 02/13/2017   High risk medication use 02/13/2017   Dyslipidemia 12/12/2015   Vitamin D deficiency 12/12/2015   Annual physical exam 12/11/2015   Migraine without aura and with status migrainosus, not intractable 12/11/2015   Obesity (BMI 35.0-39.9 without comorbidity) 12/11/2015   Encounter for long-term (current) use of high-risk medication 04/26/2014    Abnormal Pap smear of cervix 06/16/2013    Class: History of   Lupus (systemic lupus erythematosus) ([place]) 10/22/2011    PCP: Janie Morning, DO  REFERRING PROVIDER: Persons, Bevely Palmer, PA  REFERRING DIAG: [phone] (ICD-10-CM) - Pain in left ankle and joints of left foot  THERAPY DIAG:  Pain in left ankle and joints of left foot  Localized edema  Stiffness of left ankle, not elsewhere classified  Unsteadiness on feet  Muscle weakness (generalized)  Rationale for Evaluation and Treatment: Rehabilitation  ONSET DATE: Oct 2023  SUBJECTIVE:   SUBJECTIVE STATEMENT:  The past two days I wanted to be in a boot because my ankle was feeling terrible. I was trying to move more in general this week but it hurts to be up on it, I don't know if that was it but some part of me makes me feel like the brace is making me weaker. Slightly turned ankle on a small weight in the garage. Don't have the brace this morning but will put it on for an event later. I don't remember how I felt after last session but it was like a workout ache I don't think it was painful.   PERTINENT HISTORY: Lupus, obesity, raynaud's, migraines, Lt achilles tendon repair  PAIN:  Are you having pain? Yes: NPRS  scale: 3/10 Pain location: Lt ankle medially Pain description: aching pain, dull  Aggravating factors: steps, feels like it tweaks  Relieving factors: time/rest   PRECAUTIONS: None  WEIGHT BEARING RESTRICTIONS: No  FALLS:  Has patient fallen in last 6 months? Yes. 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  PLOF: Independent and Leisure: social events, strength training/cardio (has equipment at home)  PATIENT GOALS: return to exercise  NEXT MD VISIT: 04/16/22  OBJECTIVE:   DIAGNOSTIC  FINDINGS:  X-Rays: subacute fracture of the medial malleolus with some fragmentation  PATIENT SURVEYS:  04/07/22: FOTO 64 (predicted 80)  COGNITION: Overall cognitive status: Within functional limits for tasks assessed     SENSATION: 04/07/22:WFL  EDEMA:  04/07/22: Rt ankle with increased lateral swelling due to recent sprain of Rt ankle  PALPATION: 04/07/22: tenderness medial malleolus on Lt  LOWER EXTREMITY ROM:  A/P ROM Right Eval (Active only) Left eval  Ankle dorsiflexion 0 -10/-8  Ankle plantarflexion 67 65  Ankle inversion 30 16 (P)/30  Ankle eversion 25 25/30   (Blank rows = not tested)  LOWER EXTREMITY MMT:  MMT Right eval Left eval  Ankle dorsiflexion 5/5 5/5  Ankle plantarflexion 4/5 2/5  Ankle inversion 5/5 3+/5  Ankle eversion 5/5 4+/5   (Blank rows = not tested)  FUNCTIONAL TESTS:  04/07/22:  SLS: RLE: 16 sec; LLE: 1 sec  GAIT: 04/07/22: Comments: no significant signs of instability on level surfaces; pt amb with ASO on Lt   TODAY'S TREATMENT:                                                                                                                              DATE:   04/25/22  TherEx  SLS on blue foam pad for ankle stability 4x30 seconds L  Rockerboard AP x20, inversion/eversion L ankle x8  assist from R foot PRN (but encouraged to minimize this as pain allowed) CW rocks L LE x9 pain limited Tandem stance on blue foam pad 4x30 seconds L LE in the back  Attempted eccentric gastroc work but limited by pain Forward and lateral  lunges onto BOSU L LE x15    04/18/22 TherEx Bike L3 x 6 min INV/EV bil x 20 reps L3 band Calf raises x 20 reps; light UE support SLS bil 3x15 sec; increased UE support needed on Lt SLS on compliant surface 3x15 sec bil; UE support needed on Lt Side shuffle (with calf raised and toe raises) x 3 laps at treadmill handrail Slant board stretch 3x30 sec Grapevine 10' x 3 each direction Sidestepping with L3 band  10' x 3 each direction Demonstrated modification to squats with heels elevated to decrease stress on ankles and back; pt tolerated well and encouraged working on  getting back to gym for regular exercise  04/07/22 See HEP - performed trial reps with min cues for comprehension    PATIENT EDUCATION:  Education details: HEP Person educated: Patient Education method: Explanation, Demonstration, and Handouts Education comprehension: verbalized understanding, returned demonstration, and needs further education  HOME EXERCISE PROGRAM: Access Code: [email or web address] URL: [email or web address] Date: 04/07/2022 Prepared by: Faustino Congress  Exercises - Seated Ankle Inversion with Resistance  - 2 x daily - 7 x weekly - 1 sets - 20 reps - Seated Ankle Eversion with Resistance  - 2 x daily - 7 x weekly - 1 sets - 20 reps - Heel Raises with Counter Support  - 2 x daily - 7 x weekly - 1 sets - 20 reps - Standing Single Leg Stance with Unilateral Counter Support  - 2 x daily - 7 x weekly - 1 sets - 5 reps - 10-15 sec hold  ASSESSMENT:  CLINICAL IMPRESSION:  Bri arrives late today, tells me she did tweak her ankle in the past couple days and has been having increased pain. We worked on ankle stability and NMR control as time allowed today, did discuss how braces are helpful in healing process but can contribute to pain if worn for an excessively long period. Will continue to progress as able.    OBJECTIVE IMPAIRMENTS: decreased balance, difficulty walking, decreased ROM, decreased strength, increased edema, and pain.   ACTIVITY LIMITATIONS: sitting, standing, squatting, stairs, and locomotion level  PARTICIPATION LIMITATIONS: meal prep, cleaning, laundry, driving, shopping, community activity, and occupation  PERSONAL FACTORS: Past/current experiences, Time since onset of injury/illness/exacerbation, and 3+ comorbidities: Lupus, obesity, raynaud's, migraines, Lt achilles tendon repair  are  also affecting patient's functional outcome.   REHAB POTENTIAL: Good  CLINICAL DECISION MAKING: Evolving/moderate complexity  EVALUATION COMPLEXITY: Moderate   GOALS: Goals reviewed with patient? Yes  SHORT TERM GOALS: Target date: 04/28/2022  Independent with initial HEP Goal status: MET 04/18/22   LONG TERM GOALS: Target date: 05/19/2022  Independent with final HEP Goal status: INITIAL  2.  FOTO score improved to 71 Goal status: INITIAL  3.  Lt ankle strength improved to 5/5 inversion and eversion for improved function and mobility Goal status: INIITAL  4.  Report pain < 3/10 with standing and walking activities for improved function Goal status: INITIAL  5.  Improved Lt ankle strength and balance by performing SLS > 15 sec Goal status: INITIAL    PLAN:  PT FREQUENCY: 1x/week  PT DURATION: 6 weeks  PLANNED INTERVENTIONS: Therapeutic exercises, Therapeutic activity, Neuromuscular re-education, Balance training, Gait training, Patient/Family education, Self Care, Joint mobilization, Stair training, Aquatic Therapy, Dry Needling, Electrical stimulation, Cryotherapy, Moist heat, Taping, Ultrasound, Ionotophoresis '4mg'$ /ml Dexamethasone, Manual therapy, and Re-evaluation  PLAN FOR NEXT SESSION: progress HEP PRN, progress balance activities (compliant surface), bil ankle strengthening   Deniece Ree PT DPT PN2

## 2022-04-30 ENCOUNTER — Ambulatory Visit (INDEPENDENT_AMBULATORY_CARE_PROVIDER_SITE_OTHER): Payer: 59 | Admitting: Physical Therapy

## 2022-04-30 ENCOUNTER — Encounter: Payer: Self-pay | Admitting: Physical Therapy

## 2022-04-30 DIAGNOSIS — M25672 Stiffness of left ankle, not elsewhere classified: Secondary | ICD-10-CM

## 2022-04-30 DIAGNOSIS — R600 Localized edema: Secondary | ICD-10-CM

## 2022-04-30 DIAGNOSIS — R2681 Unsteadiness on feet: Secondary | ICD-10-CM | POA: Diagnosis not present

## 2022-04-30 DIAGNOSIS — M25572 Pain in left ankle and joints of left foot: Secondary | ICD-10-CM

## 2022-04-30 DIAGNOSIS — M6281 Muscle weakness (generalized): Secondary | ICD-10-CM

## 2022-04-30 NOTE — Therapy (Signed)
OUTPATIENT PHYSICAL THERAPY LOWER EXTREMITY TREATMENT   Patient Name: Tammy Giles MRN: [id number] DOB:04-17-1985, 37 y.o., female Today's Date: 04/30/2022  END OF SESSION:  PT End of Session - 04/30/22 0901     Visit Number 4    Number of Visits 6    Date for PT Re-Evaluation 05/19/22    Authorization Type [employer] 20% coinsurance; 60 visit limit    PT Start Time 0900   pt late   PT Stop Time 0926    PT Time Calculation (min) 26 min    Activity Tolerance Patient tolerated treatment well    Behavior During Therapy [facility] for tasks assessed/performed                Past Medical History:  Diagnosis Date   Abnormal Pap smear of cervix    LGSIL 2014 & 2015, ASCUS HPV HR+ 2016, ASCUS HPV HR+ 2017, 12-26-16 LGSIL   Achilles rupture, left 05/13/2014   Irregular periods    has IUD   Lupus ([place])    Migraines    STD (sexually transmitted disease)    HSV2   Past Surgical History:  Procedure Laterality Date   ACHILLES TENDON SURGERY Left 05/24/2014   Procedure: LEFT ACHILLES TENDON REPAIR;  Surgeon: Tammy Koyanagi, MD;  Location: [place];  Service: Orthopedics;  Laterality: Left;   COLPOSCOPY     2014, 2015, 2016, 2017 LGSIL, 2018   INTRAUTERINE DEVICE (IUD) INSERTION     insertion 07-18-16   WISDOM TOOTH EXTRACTION     Patient Active Problem List   Diagnosis Date Noted   Pain in left ankle and joints of left foot 03/18/2022   Cervical strain 12/11/2021   Class 2 obesity without serious comorbidity with body mass index (BMI) of 35.0 to 35.9 in adult 02/13/2017   Raynaud's syndrome without gangrene 02/13/2017   High risk medication use 02/13/2017   Dyslipidemia 12/12/2015   Vitamin D deficiency 12/12/2015   Annual physical exam 12/11/2015   Migraine without aura and with status migrainosus, not intractable 12/11/2015   Obesity (BMI 35.0-39.9 without comorbidity) 12/11/2015   Encounter for long-term (current) use of high-risk medication 04/26/2014   Abnormal  Pap smear of cervix 06/16/2013    Class: History of   Lupus (systemic lupus erythematosus) ([place]) 10/22/2011    PCP: Tammy Morning, DO  REFERRING PROVIDER: Persons, Bevely Palmer, PA  REFERRING DIAG: [phone] (ICD-10-CM) - Pain in left ankle and joints of left foot  THERAPY DIAG:  Pain in left ankle and joints of left foot  Localized edema  Stiffness of left ankle, not elsewhere classified  Unsteadiness on feet  Muscle weakness (generalized)  Rationale for Evaluation and Treatment: Rehabilitation  ONSET DATE: Oct 2023  SUBJECTIVE:   SUBJECTIVE STATEMENT: Her back has been more uncomfortable ("I have a pinched nerve in my back") since doing some balance work last week.   Rt side back is painful.   PERTINENT HISTORY: Lupus, obesity, raynaud's, migraines, Lt achilles tendon repair  PAIN:  Are you having pain? Yes: NPRS scale: 2 (ankle); 5-6 in back/10 Pain location: Lt ankle medially Pain description: aching pain, dull  Aggravating factors: steps, feels like it tweaks  Relieving factors: time/rest   PRECAUTIONS: None  WEIGHT BEARING RESTRICTIONS: No  FALLS:  Has patient fallen in last 6 months? Yes. 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  PLOF: Independent and Leisure: social events, strength training/cardio (has equipment at home)  PATIENT GOALS: return to exercise  NEXT MD VISIT: 04/16/22  OBJECTIVE:   DIAGNOSTIC FINDINGS:  X-Rays: subacute fracture of the medial malleolus with some fragmentation  PATIENT SURVEYS:  04/07/22: FOTO 64 (predicted 71)  COGNITION: Overall cognitive status: Within functional limits for tasks assessed     SENSATION: 04/07/22:WFL  EDEMA:  04/07/22: Rt ankle with increased lateral swelling due to recent sprain of Rt  ankle  PALPATION: 04/07/22: tenderness medial malleolus on Lt  LOWER EXTREMITY ROM:  A/P ROM Right Eval (Active only) Left eval  Ankle dorsiflexion 0 -10/-8  Ankle plantarflexion 67 65  Ankle inversion 30 16 (P)/30  Ankle eversion 25 25/30   (Blank rows = not tested)  LOWER EXTREMITY MMT:  MMT Right eval Left eval  Ankle dorsiflexion 5/5 5/5  Ankle plantarflexion 4/5 2/5  Ankle inversion 5/5 3+/5  Ankle eversion 5/5 4+/5   (Blank rows = not tested)  FUNCTIONAL TESTS:  04/07/22:  SLS: RLE: 16 sec; LLE: 1 sec  GAIT: 04/07/22: Comments: no significant signs of instability on level surfaces; pt amb with ASO on Lt   TODAY'S TREATMENT:                                                                                                                              DATE:  04/30/22 NuStep NuStep L5 x 5 min Step ups onto balance disc (gray disc) x 10 reps bil Calf raises with quarter turn and L4 band around ankles x 10 each direction  Slant board stretch 3x30 sec Tandem stand on foam beam 2x30 sec bil Tandem walking on foam fwd/bwd with intermittent UE support x 3 laps Side stepping on foam balance beam x 3 laps   04/25/22 TherEx  SLS on blue foam pad for ankle stability 4x30 seconds L  Rockerboard AP x20, inversion/eversion L ankle x8  assist from R foot PRN (but encouraged to minimize this as pain allowed) CW rocks L LE x9 pain limited Tandem stance on blue foam pad 4x30 seconds L LE in the back  Attempted eccentric gastroc work but limited by pain Forward and lateral  lunges onto BOSU L LE x15   04/18/22 TherEx Bike L3 x 6 min INV/EV bil x 20 reps L3 band Calf raises x 20 reps; light UE support SLS bil 3x15 sec; increased UE support needed on Lt SLS on compliant surface 3x15 sec bil; UE support needed on Lt Side shuffle (with calf raised and toe raises) x 3 laps at treadmill handrail Slant board stretch 3x30 sec G[place]vine 10' x 3 each direction Sidestepping with L3  band 10' x 3 each direction Demonstrated modification to squats with heels elevated to decrease stress on ankles and back; pt tolerated well and encouraged working on getting back to gym for regular exercise  04/07/22 See HEP - performed trial reps with min cues for comprehension    PATIENT EDUCATION:  Education details: HEP Person educated: Patient Education method: Explanation, Demonstration, and Handouts Education comprehension: verbalized understanding, returned demonstration, and needs further education  HOME EXERCISE PROGRAM: Access Code: [email or web address] URL: [email or web address] Date: 04/07/2022 Prepared by: Faustino Congress  Exercises - Seated Ankle Inversion with Resistance  - 2 x daily - 7 x weekly - 1 sets - 20 reps - Seated Ankle Eversion with Resistance  - 2 x daily - 7 x weekly - 1 sets - 20 reps - Heel Raises with Counter Support  - 2 x daily - 7 x weekly - 1 sets - 20 reps - Standing Single Leg Stance with Unilateral Counter Support  - 2 x daily - 7 x weekly - 1 sets - 5 reps - 10-15 sec hold  ASSESSMENT:  CLINICAL IMPRESSION: Pt tolerated session well today without increase in ankle pain.  No c/o increased back pain today with activities.  Recommended frequent breaks as she will be traveling over the next few days.  Will continue to benefit from PT to maximize function.   OBJECTIVE IMPAIRMENTS: decreased balance, difficulty walking, decreased ROM, decreased strength, increased edema, and pain.   ACTIVITY LIMITATIONS: sitting, standing, squatting, stairs, and locomotion level  PARTICIPATION LIMITATIONS: meal prep, cleaning, laundry, driving, shopping, community activity, and occupation  PERSONAL FACTORS: Past/current experiences, Time since onset of injury/illness/exacerbation, and 3+ comorbidities: Lupus, obesity, raynaud's, migraines, Lt achilles tendon repair  are also affecting patient's functional outcome.   REHAB POTENTIAL: Good  CLINICAL  DECISION MAKING: Evolving/moderate complexity  EVALUATION COMPLEXITY: Moderate   GOALS: Goals reviewed with patient? Yes  SHORT TERM GOALS: Target date: 04/28/2022  Independent with initial HEP Goal status: MET 04/18/22   LONG TERM GOALS: Target date: 05/19/2022  Independent with final HEP Goal status: INITIAL  2.  FOTO score improved to 71 Goal status: INITIAL  3.  Lt ankle strength improved to 5/5 inversion and eversion for improved function and mobility Goal status: INIITAL  4.  Report pain < 3/10 with standing and walking activities for improved function Goal status: INITIAL  5.  Improved Lt ankle strength and balance by performing SLS > 15 sec Goal status: INITIAL    PLAN:  PT FREQUENCY: 1x/week  PT DURATION: 6 weeks  PLANNED INTERVENTIONS: Therapeutic exercises, Therapeutic activity, Neuromuscular re-education, Balance training, Gait training, Patient/Family education, Self Care, Joint mobilization, Stair training, Aquatic Therapy, Dry Needling, Electrical stimulation, Cryotherapy, Moist heat, Taping, Ultrasound, Ionotophoresis '4mg'$ /ml Dexamethasone, Manual therapy, and Re-evaluation  PLAN FOR NEXT SESSION: continue balance activities (compliant surface), bil ankle strengthening, check goals and plan for continuation or hold based on how she's doing   Laureen Abrahams, PT, DPT 04/30/22 9:28 AM

## 2022-05-02 ENCOUNTER — Ambulatory Visit: Payer: 59 | Admitting: Physician Assistant

## 2022-05-02 ENCOUNTER — Encounter: Payer: 59 | Admitting: Physical Therapy
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
## 2022-05-09 ENCOUNTER — Encounter: Payer: Self-pay | Admitting: Physical Therapy

## 2022-05-09 ENCOUNTER — Ambulatory Visit (INDEPENDENT_AMBULATORY_CARE_PROVIDER_SITE_OTHER): Payer: 59 | Admitting: Physical Therapy

## 2022-05-09 DIAGNOSIS — R2681 Unsteadiness on feet: Secondary | ICD-10-CM

## 2022-05-09 DIAGNOSIS — R600 Localized edema: Secondary | ICD-10-CM

## 2022-05-09 DIAGNOSIS — M25672 Stiffness of left ankle, not elsewhere classified: Secondary | ICD-10-CM

## 2022-05-09 DIAGNOSIS — M25572 Pain in left ankle and joints of left foot: Secondary | ICD-10-CM

## 2022-05-09 DIAGNOSIS — M6281 Muscle weakness (generalized): Secondary | ICD-10-CM

## 2022-05-09 NOTE — Therapy (Addendum)
OUTPATIENT PHYSICAL THERAPY LOWER EXTREMITY TREATMENT /DISCHARGE   Patient Name: Tammy Giles MRN: [id number] DOB:Aug 12, 1985, 37 y.o., female Today's Date: 05/09/2022  END OF SESSION:  PT End of Session - 05/09/22 0817     Visit Number 5    Number of Visits 6    Date for PT Re-Evaluation 05/19/22    Authorization Type [employer] 20% coinsurance; 60 visit limit    PT Start Time 0813    PT Stop Time 0851    PT Time Calculation (min) 38 min    Activity Tolerance Patient tolerated treatment well    Behavior During Therapy [facility] for tasks assessed/performed                 Past Medical History:  Diagnosis Date   Abnormal Pap smear of cervix    LGSIL 2014 & 2015, ASCUS HPV HR+ 2016, ASCUS HPV HR+ 2017, 12-26-16 LGSIL   Achilles rupture, left 05/13/2014   Irregular periods    has IUD   Lupus ([facility])    Migraines    STD (sexually transmitted disease)    HSV2   Past Surgical History:  Procedure Laterality Date   ACHILLES TENDON SURGERY Left 05/24/2014   Procedure: LEFT ACHILLES TENDON REPAIR;  Surgeon: Tarry Kos, MD;  Location: [facility];  Service: Orthopedics;  Laterality: Left;   COLPOSCOPY     2014, 2015, 2016, 2017 LGSIL, 2018   INTRAUTERINE DEVICE (IUD) INSERTION     insertion 07-18-16   WISDOM TOOTH EXTRACTION     Patient Active Problem List   Diagnosis Date Noted   Pain in left ankle and joints of left foot 03/18/2022   Cervical strain 12/11/2021   Class 2 obesity without serious comorbidity with body mass index (BMI) of 35.0 to 35.9 in adult 02/13/2017   Raynaud's syndrome without gangrene 02/13/2017   High risk medication use 02/13/2017   Dyslipidemia 12/12/2015   Vitamin D deficiency 12/12/2015   Annual physical exam 12/11/2015   Migraine without aura and with status migrainosus, not intractable 12/11/2015   Obesity (BMI 35.0-39.9 without comorbidity) 12/11/2015   Encounter for long-term (current) use of high-risk medication 04/26/2014    Abnormal Pap smear of cervix 06/16/2013    Class: History of   Lupus (systemic lupus erythematosus) ([facility]) 10/22/2011    PCP: Irena Reichmann, DO  REFERRING PROVIDER: Persons, [place], [place]  REFERRING DIAG: [phone] (ICD-10-CM) - Pain in left ankle and joints of left foot  THERAPY DIAG:  Pain in left ankle and joints of left foot  Localized edema  Stiffness of left ankle, not elsewhere classified  Unsteadiness on feet  Muscle weakness (generalized)  Rationale for Evaluation and Treatment: Rehabilitation  ONSET DATE: Oct 2023  SUBJECTIVE:   SUBJECTIVE STATEMENT: Ankle is a little more swollen; but not painful   PERTINENT HISTORY: Lupus, obesity, raynaud's, migraines, Lt achilles tendon repair  PAIN:  Are you having pain? Yes: NPRS scale: 0/10 Pain location: Lt ankle medially Pain description: aching pain, dull  Aggravating factors: steps, feels like it tweaks  Relieving factors: time/rest   PRECAUTIONS: None  WEIGHT BEARING RESTRICTIONS: No  FALLS:  Has patient fallen in last 6 months? Yes. 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  PLOF: Independent and Leisure: social events, strength training/cardio (has  equipment at home)  PATIENT GOALS: return to exercise  NEXT MD VISIT: 04/16/22  OBJECTIVE:   DIAGNOSTIC FINDINGS:  X-Rays: subacute fracture of the medial malleolus with some fragmentation  PATIENT SURVEYS:  04/07/22: FOTO 64 (predicted 71) 05/09/22: FOTO 65  COGNITION: Overall cognitive status: Within functional limits for tasks assessed     SENSATION: 04/07/22:WFL  EDEMA:  04/07/22: Rt ankle with increased lateral swelling due to recent sprain of Rt ankle  PALPATION: 04/07/22: tenderness medial malleolus on Lt  LOWER EXTREMITY  ROM:  A/P ROM Right Eval (Active only) Left eval  Ankle dorsiflexion 0 -10/-8  Ankle plantarflexion 67 65  Ankle inversion 30 16 (P)/30  Ankle eversion 25 25/30   (Blank rows = not tested)  LOWER EXTREMITY MMT:  MMT Right eval Left eval Left 05/09/22  Ankle dorsiflexion 5/5 5/5   Ankle plantarflexion 4/5 2/5 3/5  Ankle inversion 5/5 3+/5 4/5  Ankle eversion 5/5 4+/5 5/5   (Blank rows = not tested)  FUNCTIONAL TESTS:  04/07/22:  SLS: RLE: 16 sec; LLE: 1 sec 05/09/22: SLS: LLE 20 sec   GAIT: 04/07/22: Comments: no significant signs of instability on level surfaces; pt amb with ASO on Lt   TODAY'S TREATMENT:                                                                                                                              DATE:  05/09/22 NuStep NuStep L6 x 5 min; LEs only SLS on level and compliant surface - discussed EC progression Calf raises with L3 band x 20 reps Discussed other exercise progressions and options including squats with calf raises progressing to squat jumps MMT - see above   04/30/22 NuStep NuStep L5 x 5 min Step ups onto balance disc (gray disc) x 10 reps bil Calf raises with quarter turn and L4 band around ankles x 10 each direction  Slant board stretch 3x30 sec Tandem stand on foam beam 2x30 sec bil Tandem walking on foam fwd/bwd with intermittent UE support x 3 laps Side stepping on foam balance beam x 3 laps   04/25/22 TherEx  SLS on blue foam pad for ankle stability 4x30 seconds L  Rockerboard AP x20, inversion/eversion L ankle x8  assist from R foot PRN (but encouraged to minimize this as pain allowed) CW rocks L LE x9 pain limited Tandem stance on blue foam pad 4x30 seconds L LE in the back  Attempted eccentric gastroc work but limited by pain Forward and lateral  lunges onto BOSU L LE x15   04/18/22 TherEx Bike L3 x 6 min INV/EV bil x 20 reps L3 band Calf raises x 20 reps; light UE support SLS bil 3x15 sec; increased UE  support needed on Lt SLS on compliant surface 3x15 sec bil; UE support needed on Lt Side shuffle (with calf raised and toe raises) x 3 laps at treadmill handrail Slant board stretch 3x30 sec [place] 10' x 3  each direction Sidestepping with L3 band 10' x 3 each direction Demonstrated modification to squats with heels elevated to decrease stress on ankles and back; pt tolerated well and encouraged working on getting back to gym for regular exercise  04/07/22 See HEP - performed trial reps with min cues for comprehension    PATIENT EDUCATION:  Education details: HEP Person educated: Patient Education method: Programmer, multimedia, Demonstration, and Handouts Education comprehension: verbalized understanding, returned demonstration, and needs further education  HOME EXERCISE PROGRAM: Access Code: [email or web address] URL: [email or web address] Date: 04/07/2022 Prepared by: Moshe Cipro  Exercises - Seated Ankle Inversion with Resistance  - 2 x daily - 7 x weekly - 1 sets - 20 reps - Seated Ankle Eversion with Resistance  - 2 x daily - 7 x weekly - 1 sets - 20 reps - Heel Raises with Counter Support  - 2 x daily - 7 x weekly - 1 sets - 20 reps - Standing Single Leg Stance with Unilateral Counter Support  - 2 x daily - 7 x weekly - 1 sets - 5 reps - 10-15 sec hold  ASSESSMENT:  CLINICAL IMPRESSION: Pt has met/partially met most LTGs at this time, and has demonstrated progress towards all goals.  At this time feel she can transition to community based fitness with home program.  Will hold PT and pt will call if any additional visits are needed.   OBJECTIVE IMPAIRMENTS: decreased balance, difficulty walking, decreased ROM, decreased strength, increased edema, and pain.   ACTIVITY LIMITATIONS: sitting, standing, squatting, stairs, and locomotion level  PARTICIPATION LIMITATIONS: meal prep, cleaning, laundry, driving, shopping, community activity, and occupation  PERSONAL FACTORS:  Past/current experiences, Time since onset of injury/illness/exacerbation, and 3+ comorbidities: Lupus, obesity, raynaud's, migraines, Lt achilles tendon repair  are also affecting patient's functional outcome.   REHAB POTENTIAL: Good  CLINICAL DECISION MAKING: Evolving/moderate complexity  EVALUATION COMPLEXITY: Moderate   GOALS: Goals reviewed with patient? Yes  SHORT TERM GOALS: Target date: 04/28/2022  Independent with initial HEP Goal status: MET 04/18/22   LONG TERM GOALS: Target date: 05/19/2022  Independent with final HEP Goal status: Met to date 05/09/22  2.  FOTO score improved to 71 Goal status: ongoing 05/09/22  3.  Lt ankle strength improved to 5/5 inversion and eversion for improved function and mobility Goal status: partially met 05/09/22  4.  Report pain < 3/10 with standing and walking activities for improved function Goal status: MET 05/09/22  5.  Improved Lt ankle strength and balance by performing SLS > 15 sec Goal status: MET 05/09/22    PLAN:  PT FREQUENCY: 1x/week  PT DURATION: 6 weeks  PLANNED INTERVENTIONS: Therapeutic exercises, Therapeutic activity, Neuromuscular re-education, Balance training, Gait training, Patient/Family education, Self Care, Joint mobilization, Stair training, Aquatic Therapy, Dry Needling, Electrical stimulation, Cryotherapy, Moist heat, Taping, Ultrasound, Ionotophoresis 4mg /ml Dexamethasone, Manual therapy, and Re-evaluation  PLAN FOR NEXT SESSION: hold PT x 30 days; reassess if pt returns or d/c if she doesn't   Clarita Crane, PT, DPT 05/09/22 8:54 AM   PHYSICAL THERAPY DISCHARGE SUMMARY  Visits from Start of Care: 5  Current functional level related to goals / functional outcomes: See note   Remaining deficits: See note   Education / Equipment: HEP  Patient goals were  mostly met . Patient is being discharged due to not returning since the last visit.  Chyrel Masson, PT, DPT, OCS, ATC 06/19/22  1:22  PM

## 2022-05-19 ENCOUNTER — Encounter: Payer: Self-pay | Admitting: Physician Assistant

## 2022-05-19 ENCOUNTER — Ambulatory Visit: Payer: 59 | Attending: Physician Assistant | Admitting: Physician Assistant

## 2022-05-19 VITALS — BP 124/84 | HR 87 | Resp 12 | Ht 65.0 in | Wt 276.0 lb

## 2022-05-19 DIAGNOSIS — G43001 Migraine without aura, not intractable, with status migrainosus: Secondary | ICD-10-CM

## 2022-05-19 DIAGNOSIS — E559 Vitamin D deficiency, unspecified: Secondary | ICD-10-CM

## 2022-05-19 DIAGNOSIS — Z8719 Personal history of other diseases of the digestive system: Secondary | ICD-10-CM

## 2022-05-19 DIAGNOSIS — Z131 Encounter for screening for diabetes mellitus: Secondary | ICD-10-CM

## 2022-05-19 DIAGNOSIS — Z79899 Other long term (current) drug therapy: Secondary | ICD-10-CM

## 2022-05-19 DIAGNOSIS — I7300 Raynaud's syndrome without gangrene: Secondary | ICD-10-CM

## 2022-05-19 DIAGNOSIS — R5383 Other fatigue: Secondary | ICD-10-CM | POA: Diagnosis not present

## 2022-05-19 DIAGNOSIS — M3219 Other organ or system involvement in systemic lupus erythematosus: Secondary | ICD-10-CM

## 2022-05-19 DIAGNOSIS — E785 Hyperlipidemia, unspecified: Secondary | ICD-10-CM

## 2022-05-19 MED ORDER — HYDROXYCHLOROQUINE SULFATE 200 MG PO TABS: 200.0000 mg | ORAL_TABLET | Freq: Two times a day (BID) | ORAL | 0 refills | Status: DC

## 2022-05-20 ENCOUNTER — Ambulatory Visit: Payer: 59 | Admitting: Physician Assistant

## 2022-05-20 LAB — CBC WITH DIFFERENTIAL/PLATELET
Absolute Monocytes: 672 cells/uL (ref 200–950)
Basophils Absolute: 50 cells/uL (ref 0–200)
Basophils Relative: 0.6 %
Eosinophils Absolute: 58 cells/uL (ref 15–500)
Eosinophils Relative: 0.7 %
HCT: 38.8 % (ref 35.0–45.0)
Hemoglobin: 13.2 g/dL (ref 11.7–15.5)
Lymphs Abs: 1751.3 cells/uL (ref 850–3900)
MCH: 32.6 pg (ref 27.0–33.0)
MCHC: 34 g/dL (ref 32.0–36.0)
MCV: 95.8 fL (ref 80.0–100.0)
MPV: 11.5 fL (ref 7.5–12.5)
Monocytes Relative: 8.1 %
Neutro Abs: 5769 cells/uL (ref 1500–7800)
Neutrophils Relative %: 69.5 %
Platelets: 216 10*3/uL (ref 140–400)
RBC: 4.05 10*6/uL (ref 3.80–5.10)
RDW: 12 % (ref 11.0–15.0)
Total Lymphocyte: 21.1 %
WBC: 8.3 10*3/uL (ref 3.8–10.8)

## 2022-05-20 LAB — COMPLETE METABOLIC PANEL WITH GFR
AG Ratio: 1.1 (calc) (ref 1.0–2.5)
ALT: 14 U/L (ref 6–29)
AST: 21 U/L (ref 10–30)
Albumin: 4.2 g/dL (ref 3.6–5.1)
Alkaline phosphatase (APISO): 74 U/L (ref 31–125)
BUN: 13 mg/dL (ref 7–25)
CO2: 26 mmol/L (ref 20–32)
Calcium: 9.4 mg/dL (ref 8.6–10.2)
Chloride: 103 mmol/L (ref 98–110)
Creat: 0.93 mg/dL (ref 0.50–0.97)
Globulin: 3.7 g/dL (calc) (ref 1.9–3.7)
Glucose, Bld: 75 mg/dL (ref 65–99)
Potassium: 3.9 mmol/L (ref 3.5–5.3)
Sodium: 138 mmol/L (ref 135–146)
Total Bilirubin: 0.4 mg/dL (ref 0.2–1.2)
Total Protein: 7.9 g/dL (ref 6.1–8.1)
eGFR: 83 mL/min/{1.73_m2} (ref >=60)

## 2022-05-20 LAB — HEMOGLOBIN A1C
Hgb A1c MFr Bld: 5.2 % of total Hgb (ref <5.7)
Mean Plasma Glucose: 103 mg/dL
eAG (mmol/L): 5.7 mmol/L

## 2022-05-20 LAB — PROTEIN / CREATININE RATIO, URINE
Creatinine, Urine: 15 mg/dL — ABNORMAL LOW (ref 20–275)
Total Protein, Urine: <4 mg/dL — ABNORMAL LOW (ref 5–24)

## 2022-05-20 LAB — C3 AND C4
C3 Complement: 145 mg/dL (ref 83–193)
C4 Complement: 19 mg/dL (ref 15–57)

## 2022-05-20 LAB — SEDIMENTATION RATE: Sed Rate: 28 mm/h — ABNORMAL HIGH (ref 0–20)

## 2022-05-20 LAB — ANTI-DNA ANTIBODY, DOUBLE-STRANDED: ds DNA Ab: 2 IU/mL

## 2022-05-20 NOTE — Progress Notes (Signed)
CMP WNL

## 2022-05-20 NOTE — Progress Notes (Signed)
No proteinuria.  ESR remains slightly elevated-28.   CBC WNL.  hgbA1C is WNL-5.2%

## 2022-05-20 NOTE — Progress Notes (Signed)
Complements WNL

## 2022-05-21 ENCOUNTER — Ambulatory Visit: Payer: 59 | Admitting: Physician Assistant

## 2022-05-21 NOTE — Progress Notes (Signed)
dsDNA is negative.  Labs are not consistent with a flare.

## 2022-05-22 ENCOUNTER — Ambulatory Visit (INDEPENDENT_AMBULATORY_CARE_PROVIDER_SITE_OTHER): Payer: 59 | Admitting: Physician Assistant

## 2022-05-22 ENCOUNTER — Encounter: Payer: Self-pay | Admitting: Physician Assistant

## 2022-05-22 DIAGNOSIS — M25572 Pain in left ankle and joints of left foot: Secondary | ICD-10-CM | POA: Diagnosis not present

## 2022-05-22 NOTE — Progress Notes (Signed)
Office Visit Note   Patient: Tammy Giles           Date of Birth: [birth year]           MRN: [id number] Visit Date: 05/22/2022              Requested by: Janie Morning, DO [place],  [place] [address] PCP: Janie Morning, DO  Chief Complaint  Patient presents with   Left Ankle - Pain, Follow-up      HPI: Tammy Giles is a pleasant 37 year old woman who I been following for left medial ankle pain.  This occurred in October when she stepped in a hole.  She did have findings with a subacute fragmented fracture of the medial malleolus.  Given the time out we recommended physical therapy.  She is now been doing this for about 2 months and she feels most of the time 60% better though today she only felt 40% better but she admits she did workout yesterday.  Overall she is doing better focus pain is only on the medial side of the ankle  Assessment & Plan: Visit Diagnoses:  1. Pain in left ankle and joints of left foot     Plan: I had a discussion with her today.  We could order an MRI if she thinks this was not really improving.  She states she would like to continue to do her home exercise program.  She does know that if it gets worse or she is not pleased with her progress she can contact me by phone or message and I will order an MRI and follow-up with Dr. Sharol Given  Follow-Up Instructions: Return if symptoms worsen or fail to improve.   Ortho Exam  Patient is alert, oriented, no adenopathy, well-dressed, normal affect, normal respiratory effort. Examination of her left ankle no swelling no erythema she has a strong palpable pulse brisk capillary refill sensation is intact good plantarflexion dorsiflexion eversion inversion.  Has some tenderness at the interface between the medial malleolus and the deltoid.  No tenderness laterally good endpoint on anterior draw  Imaging: No results found. No images are attached to the encounter.  Labs: Lab Results  Component  Value Date   HGBA1C 5.2 05/19/2022   HGBA1C 5.1 01/29/2022   ESRSEDRATE 28 (H) 05/19/2022   ESRSEDRATE 28 (H) 01/29/2022   ESRSEDRATE 17 07/29/2021   LABURIC 5.4 10/14/2019   LABORGA No Neisseria gonorrhoeae Isolated 11/06/2015     No results found for: "ALBUMIN", "PREALBUMIN", "CBC"  No results found for: "MG" Lab Results  Component Value Date   VD25OH 32 07/29/2021   VD25OH 30 09/19/2020   VD25OH 30 10/14/2019    No results found for: "PREALBUMIN"    Latest Ref Rng & Units 05/19/2022    1:42 PM 01/29/2022    9:46 AM 01/04/2022    1:53 AM  CBC EXTENDED  WBC 3.8 - 10.8 Thousand/uL 8.3  7.3  9.4   RBC 3.80 - 5.10 Million/uL 4.05  4.18  4.20   Hemoglobin 11.7 - 15.5 g/dL 13.2  13.6  13.4   HCT 35.0 - 45.0 % 38.8  39.9  39.4   Platelets 140 - 400 Thousand/uL 216  214  240   NEUT# 1,500 - 7,800 cells/uL 5,769  4,884    Lymph# 850 - 3,900 cells/uL 1,751.3  1,796       There is no height or weight on file to calculate BMI.  Orders:  No orders of the defined  types were placed in this encounter.  No orders of the defined types were placed in this encounter.    Procedures: No procedures performed  Clinical Data: No additional findings.  ROS:  All other systems negative, except as noted in the HPI. Review of Systems  Objective: Vital Signs: LMP 04/03/2022   Specialty Comments:  INDICATION: Low back pain, unspecified back pain laterality, unspecified chronicity, unspecified whether sciatica present   COMPARISON: Plain films of the lumbar spine 11/07/2021   TECHNIQUE: MRI SPINE LUMBAR WO IV CONTRAST.   FINDINGS:  # Osseous structures: Vertebral body heights are maintained. No acute fracture. No pars defect appreciated. No destructive bony changes.  #  Alignment:No significant subluxation.  #  Conus medullaris/cauda equina: Conus appears normal and terminates at L1-2.   #  Lower thoracic spine: No significant spinal canal or foraminal stenosis.   #  T12-L1:  Preservation of disc height. No focal disc herniation, significant spinal canal or foraminal stenosis.  #  L1-L2: Preservation of disc height. No focal disc herniation, significant spinal canal or foraminal stenosis.  #  L2-L3: Preservation of disc height. No focal disc herniation, significant spinal canal or foraminal stenosis.  #  L3-L4: Preservation of disc height. No focal disc herniation, significant spinal canal or foraminal stenosis.  #  L4-L5: Preservation of disc height. No focal disc herniation, significant spinal canal or foraminal stenosis.  #  L5-S1: Preservation of disc height. No focal disc herniation, significant spinal canal or foraminal stenosis.   #  Paraspinal tissues: Unremarkable   #  Contrast: None given.   #  Additional comments: None.    IMPRESSION:  1. No focal disc herniation or significant stenosis. No nerve root impingement appreciated.  2.  No acute bony abnormality.   Electronically Signed by: Eddie Dibbles, MD on 03/07/2022 1:08 PM  PMFS History: Patient Active Problem List   Diagnosis Date Noted   Pain in left ankle and joints of left foot 03/18/2022   Cervical strain 12/11/2021   Class 2 obesity without serious comorbidity with body mass index (BMI) of 35.0 to 35.9 in adult 02/13/2017   Raynaud's syndrome without gangrene 02/13/2017   High risk medication use 02/13/2017   Dyslipidemia 12/12/2015   Vitamin D deficiency 12/12/2015   Annual physical exam 12/11/2015   Migraine without aura and with status migrainosus, not intractable 12/11/2015   Obesity (BMI 35.0-39.9 without comorbidity) 12/11/2015   Encounter for long-term (current) use of high-risk medication 04/26/2014   Abnormal Pap smear of cervix 06/16/2013    Class: History of   Lupus (systemic lupus erythematosus) ([facility]) 10/22/2011   Past Medical History:  Diagnosis Date   Abnormal Pap smear of cervix    LGSIL 2014 & 2015, ASCUS HPV HR+ 2016, ASCUS HPV HR+ 2017, 12-26-16 LGSIL   Achilles  rupture, left 05/13/2014   Irregular periods    has IUD   Lupus    Migraines    STD (sexually transmitted disease)    HSV2    Family History  Problem Relation Age of Onset   Hypertension Mother    Hypertension Father    Depression Father    Prostate cancer Father    Heart disease Father    Thyroid disease Sister     Past Surgical History:  Procedure Laterality Date   ACHILLES TENDON SURGERY Left 05/24/2014   Procedure: LEFT ACHILLES TENDON REPAIR;  Surgeon: Leandrew Koyanagi, MD;  Location: [facility];  Service: Orthopedics;  Laterality: Left;  COLPOSCOPY     2014, 2015, 2016, 2017 LGSIL, 2018   INTRAUTERINE DEVICE (IUD) INSERTION     insertion 07-18-16   WISDOM TOOTH EXTRACTION     Social History   Occupational History    Employer: [employer]  Tobacco Use   Smoking status: Never    Passive exposure: Never   Smokeless tobacco: Never  Vaping Use   Vaping Use: Never used  Substance and Sexual Activity   Alcohol use: Yes    Alcohol/week: 3.0 standard drinks of alcohol    Types: 3 Standard drinks or equivalent per week    Comment: occ   Drug use: No   Sexual activity: Yes    Partners: Male    Birth control/protection: I.U.D.

## 2022-08-04 NOTE — Progress Notes (Signed)
Office Visit Note  Patient: Tammy Giles             Date of Birth: 02/28/1985           MRN: [id number]             PCP: Irena Reichmann, DO Referring: Irena Reichmann, DO Visit Date: 08/18/2022 Occupation: @GUAROCC @  Subjective:  Medication monitoring   History of Present Illness: Tammy Giles is a 37 y.o. female with history of systemic lupus.  Patient remains on Plaquenil 200 mg 1 tablet by mouth twice daily.  She is tolerating Plaquenil without any side effects and has not missed any doses recently.  Patient states that she has not noticed any signs or symptoms of a systemic lupus flare.  Patient states that she has been working on weight loss by increasing her activity level including performing high intensity workouts as well as making dietary changes.  She has been trying to cut out gluten and processed sugars which she feels has helped with her general wellbeing and inflammation.  She states her wrist joint pain and inflammation has improved.  She still has occasional arthralgias in her shoulders and knee joints but denies any joint swelling.  Patient states that she was traveling to [facility] for work and had sudden exposure which led to hives.  She states that she has now started to wear sunscreen lotion on daily basis.  She denies any other recent rashes.  She denies any symptoms of Raynaud's phenomenon.  She denies any sores in her mouth.  Activities of Daily Living:  Patient reports morning stiffness for a few minutes.   Patient Reports nocturnal pain.  Difficulty dressing/grooming: Denies Difficulty climbing stairs: Reports Difficulty getting out of chair: Denies Difficulty using hands for taps, buttons, cutlery, and/or writing: Denies  Review of Systems  Constitutional:  Positive for fatigue.  HENT:  Positive for mouth dryness. Negative for mouth sores.   Eyes:  Negative for dryness.  Respiratory:  Negative for shortness of breath.   Cardiovascular:  Negative for  chest pain and palpitations.  Gastrointestinal:  Positive for constipation. Negative for blood in stool and diarrhea.  Endocrine: Negative for increased urination.  Genitourinary:  Negative for involuntary urination.  Musculoskeletal:  Positive for joint pain, joint pain, joint swelling and morning stiffness. Negative for gait problem, myalgias, muscle weakness, muscle tenderness and myalgias.  Skin:  Positive for sensitivity to sunlight. Negative for color change, rash and hair loss.  Allergic/Immunologic: Positive for susceptible to infections.  Neurological:  Positive for headaches. Negative for dizziness.  Hematological:  Negative for swollen glands.  Psychiatric/Behavioral:  Positive for sleep disturbance. Negative for depressed mood. The patient is not nervous/anxious.     PMFS History:  Patient Active Problem List   Diagnosis Date Noted   Pain in left ankle and joints of left foot 03/18/2022   Cervical strain 12/11/2021   Class 2 obesity without serious comorbidity with body mass index (BMI) of 35.0 to 35.9 in adult 02/13/2017   Raynaud's syndrome without gangrene 02/13/2017   High risk medication use 02/13/2017   Dyslipidemia 12/12/2015   Vitamin D deficiency 12/12/2015   Annual physical exam 12/11/2015   Migraine without aura and with status migrainosus, not intractable 12/11/2015   Obesity (BMI 35.0-39.9 without comorbidity) 12/11/2015   Encounter for long-term (current) use of high-risk medication 04/26/2014   Abnormal Pap smear of cervix 06/16/2013    Class: History of   Lupus (systemic lupus erythematosus) ([facility]) 10/22/2011  Past Medical History:  Diagnosis Date   Abnormal Pap smear of cervix    LGSIL 2014 & 2015, ASCUS HPV HR+ 2016, ASCUS HPV HR+ 2017, 12-26-16 LGSIL   Achilles rupture, left 05/13/2014   Irregular periods    has IUD   Lupus ([facility])    Migraines    STD (sexually transmitted disease)    HSV2    Family History  Problem Relation Age of Onset    Hypertension Mother    Hypertension Father    Depression Father    Prostate cancer Father    Heart disease Father    Thyroid disease Sister    Past Surgical History:  Procedure Laterality Date   ACHILLES TENDON SURGERY Left 05/24/2014   Procedure: LEFT ACHILLES TENDON REPAIR;  Surgeon: Tarry Kos, MD;  Location: [facility];  Service: Orthopedics;  Laterality: Left;   COLPOSCOPY     2014, 2015, 2016, 2017 LGSIL, 2018   INTRAUTERINE DEVICE (IUD) INSERTION     insertion 07-18-16   WISDOM TOOTH EXTRACTION     Social History   Social History Narrative   Not on file   Immunization History  Administered Date(s) Administered   Influenza Split 11/20/2014   Influenza,inj,Quad PF,6+ Mos 12/11/2015   PFIZER(Purple Top)SARS-COV-2 Vaccination 07/17/2019, 08/07/2019, 01/11/2020   Tdap 02/18/2007, 12/26/2016     Objective: Vital Signs: BP 119/87 (BP Location: Left Arm, Patient Position: Sitting, Cuff Size: Large)   Pulse 67   Resp 17   Ht 5\' 5"  (1.651 m)   Wt 269 lb (122 kg)   BMI 44.76 kg/m    Physical Exam Vitals and nursing note reviewed.  Constitutional:      Appearance: She is well-developed.  HENT:     Head: Normocephalic and atraumatic.  Eyes:     Conjunctiva/sclera: Conjunctivae normal.  Cardiovascular:     Rate and Rhythm: Normal rate and regular rhythm.     Heart sounds: Normal heart sounds.  Pulmonary:     Effort: Pulmonary effort is normal.     Breath sounds: Normal breath sounds.  Abdominal:     General: Bowel sounds are normal.     Palpations: Abdomen is soft.  Musculoskeletal:     Cervical back: Normal range of motion.  Lymphadenopathy:     Cervical: No cervical adenopathy.  Skin:    General: Skin is warm and dry.     Capillary Refill: Capillary refill takes less than 2 seconds.  Neurological:     Mental Status: She is alert and oriented to person, place, and time.  Psychiatric:        Behavior: Behavior normal.      Musculoskeletal  Exam: C-spine, thoracic spine, and lumbar spine good ROM.  No midline spinal tenderness.  Shoulder joints, elbow joints, wrist joints, MCPs, PIPs, DIPs have good range of motion with no synovitis.  Complete fist formation bilaterally.  Hip joints have good range of motion with no groin pain.  Knee joints have good range of motion with no warmth or effusion.  Ankle joints have good range of motion with no tenderness or synovitis.  No tenderness over MTP joints.  CDAI Exam: CDAI Score: -- Patient Global: --; Provider Global: -- Swollen: --; Tender: -- Joint Exam 08/18/2022   No joint exam has been documented for this visit   There is currently no information documented on the homunculus. Go to the Rheumatology activity and complete the homunculus joint exam.  Investigation: No additional findings.  Imaging: No  results found.  Recent Labs: Lab Results  Component Value Date   WBC 8.3 05/19/2022   HGB 13.2 05/19/2022   PLT 216 05/19/2022   NA 138 05/19/2022   K 3.9 05/19/2022   CL 103 05/19/2022   CO2 26 05/19/2022   GLUCOSE 75 05/19/2022   BUN 13 05/19/2022   CREATININE 0.93 05/19/2022   BILITOT 0.4 05/19/2022   AST 21 05/19/2022   ALT 14 05/19/2022   PROT 7.9 05/19/2022   CALCIUM 9.4 05/19/2022   GFRAA 97 04/18/2020    Speciality Comments: PLQ Eye Exam: 08/21/2021 WNL @ [facility] Follow up in 12 months  Procedures:  No procedures performed Allergies: Amoxicillin, Other, and Ibuprofen       Assessment / Plan:     Visit Diagnoses: 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  She has clinically been doing well taking Plaquenil 200 mg 1 tablet by mouth twice daily.  She continues to tolerate Plaquenil without any side effects and has not had any interruptions in therapy recently.  She has no  synovitis on examination today.  No cervical lymphadenopathy noted.  No Malar rash noted.  Discussed the importance of wearing sunscreen SPF greater than 50 on a daily basis as well as wearing sun protective clothing and a hat if she is planning on being outdoors.  She has not had any symptoms of Raynaud's phenomenon.  Capillary refill is less than 2 seconds today. Patient has been working on weight loss by making dietary changes as well as performing high intensity workouts.  She has started to cut out gluten and processed sugars which has helped with her general wellbeing and inflammation. Lab work from 05/19/22 was reviewed today in the office: ESR 28, no proteinuria, dsDNA is negative, complements WNL, and CBC and CMP WNL.  The following lab work will be obtained today for further evaluation.  She will remain on Plaquenil as prescribed.  She was vies notify us if she develops signs or symptoms of a flare.  She will follow-up in the office in 3 months or sooner if needed.  - Plan: Protein / creatinine ratio, urine, CBC with Differential/Platelet, COMPLETE METABOLIC PANEL WITH GFR, Anti-DNA antibody, double-stranded, C3 and C4, Sedimentation rate  High risk medication use - Plaquenil 200 mg 1 tablet by mouth twice daily.  Her IUD was removed in October 2023--planning pregnancy in the future. PLQ Eye Exam: 08/21/2021.  Patient was given a pocket eye examination form to take with her to her upcoming appointment. CBC and CMP WNL on 05/19/22.  CBC and CMP will be updated today. - Plan: CBC with Differential/Platelet, COMPLETE METABOLIC PANEL WITH GFR  Raynaud's syndrome without gangrene: Not currently symptomatic.  No digital ulcerations or signs of gangrene noted.  Capillary refill less than 2 seconds.  No signs of sclerodactyly noted.  Other fatigue: Improved.  She has been performing high intensity workouts and has been working on weight loss with dietary changes.  Other medical conditions are listed as  follows:  Vitamin D deficiency: She is taking vitamin D supplement daily.  Migraine   Dyslipidemia  History of gastroesophageal reflux (GERD)  Orders: Orders Placed This Encounter  Procedures   Protein / creatinine ratio, urine   CBC with Differential/Platelet   COMPLETE METABOLIC PANEL WITH GFR   Anti-DNA antibody,  double-stranded   C3 and C4   Sedimentation rate   No orders of the defined types were placed in this encounter.    Follow-Up Instructions: Return in about 3 months (around 11/18/2022) for Systemic lupus erythematosus.   Gearldine Bienenstock, PA-C  Note - This record has been created using Dragon software.  Chart creation errors have been sought, but may not always  have been located. Such creation errors do not reflect on  the standard of medical care.

## 2022-08-15 ENCOUNTER — Other Ambulatory Visit: Payer: Self-pay | Admitting: Physician Assistant

## 2022-08-15 DIAGNOSIS — M3219 Other organ or system involvement in systemic lupus erythematosus: Secondary | ICD-10-CM

## 2022-08-15 NOTE — Telephone Encounter (Signed)
Last Fill: 05/19/2022  Eye exam: 08/21/2021 WNL   Labs: 05/19/2022 CMP WNL CBC WNL.   Next Visit: 08/18/2022  Last Visit: 05/19/2022  DX:Other systemic lupus erythematosus with other organ involvement   Current Dose per office note 05/19/2022: Plaquenil 200 mg 1 tablet by mouth twice daily   Patient aware she is due to update her PLQ eye exam. Patient will call to make an appointment.   Okay to refill Plaquenil?

## 2022-08-18 ENCOUNTER — Ambulatory Visit: Payer: 59 | Attending: Physician Assistant | Admitting: Physician Assistant

## 2022-08-18 ENCOUNTER — Encounter: Payer: Self-pay | Admitting: Physician Assistant

## 2022-08-18 VITALS — BP 119/87 | HR 67 | Resp 17 | Ht 65.0 in | Wt 269.0 lb

## 2022-08-18 DIAGNOSIS — I7300 Raynaud's syndrome without gangrene: Secondary | ICD-10-CM | POA: Diagnosis not present

## 2022-08-18 DIAGNOSIS — M3219 Other organ or system involvement in systemic lupus erythematosus: Secondary | ICD-10-CM

## 2022-08-18 DIAGNOSIS — R5383 Other fatigue: Secondary | ICD-10-CM

## 2022-08-18 DIAGNOSIS — E559 Vitamin D deficiency, unspecified: Secondary | ICD-10-CM

## 2022-08-18 DIAGNOSIS — Z79899 Other long term (current) drug therapy: Secondary | ICD-10-CM

## 2022-08-18 DIAGNOSIS — Z8719 Personal history of other diseases of the digestive system: Secondary | ICD-10-CM

## 2022-08-18 DIAGNOSIS — G43001 Migraine without aura, not intractable, with status migrainosus: Secondary | ICD-10-CM

## 2022-08-18 DIAGNOSIS — E785 Hyperlipidemia, unspecified: Secondary | ICD-10-CM

## 2022-08-19 LAB — COMPLETE METABOLIC PANEL WITH GFR
AG Ratio: 1.2 (calc) (ref 1.0–2.5)
ALT: 13 U/L (ref 6–29)
AST: 16 U/L (ref 10–30)
Albumin: 4.2 g/dL (ref 3.6–5.1)
Alkaline phosphatase (APISO): 73 U/L (ref 31–125)
BUN/Creatinine Ratio: 11 (calc) (ref 6–22)
BUN: 13 mg/dL (ref 7–25)
CO2: 26 mmol/L (ref 20–32)
Calcium: 9.7 mg/dL (ref 8.6–10.2)
Chloride: 104 mmol/L (ref 98–110)
Creat: 1.15 mg/dL — ABNORMAL HIGH (ref 0.50–0.97)
Globulin: 3.6 g/dL (calc) (ref 1.9–3.7)
Glucose, Bld: 82 mg/dL (ref 65–99)
Potassium: 4.5 mmol/L (ref 3.5–5.3)
Sodium: 138 mmol/L (ref 135–146)
Total Bilirubin: 0.3 mg/dL (ref 0.2–1.2)
Total Protein: 7.8 g/dL (ref 6.1–8.1)
eGFR: 64 mL/min/{1.73_m2} (ref >=60)

## 2022-08-19 LAB — CBC WITH DIFFERENTIAL/PLATELET
Absolute Monocytes: 643 cells/uL (ref 200–950)
Basophils Absolute: 50 cells/uL (ref 0–200)
Basophils Relative: 0.8 %
Eosinophils Absolute: 113 cells/uL (ref 15–500)
Eosinophils Relative: 1.8 %
HCT: 40 % (ref 35.0–45.0)
Hemoglobin: 13.4 g/dL (ref 11.7–15.5)
Lymphs Abs: 1865 cells/uL (ref 850–3900)
MCH: 32.1 pg (ref 27.0–33.0)
MCHC: 33.5 g/dL (ref 32.0–36.0)
MCV: 95.7 fL (ref 80.0–100.0)
MPV: 12.1 fL (ref 7.5–12.5)
Monocytes Relative: 10.2 %
Neutro Abs: 3629 cells/uL (ref 1500–7800)
Neutrophils Relative %: 57.6 %
Platelets: 233 10*3/uL (ref 140–400)
RBC: 4.18 10*6/uL (ref 3.80–5.10)
RDW: 12.2 % (ref 11.0–15.0)
Total Lymphocyte: 29.6 %
WBC: 6.3 10*3/uL (ref 3.8–10.8)

## 2022-08-19 LAB — PROTEIN / CREATININE RATIO, URINE
Creatinine, Urine: 140 mg/dL (ref 20–275)
Protein/Creat Ratio: 50 mg/g creat (ref 24–184)
Protein/Creatinine Ratio: 0.05 mg/mg creat (ref 0.024–0.184)
Total Protein, Urine: 7 mg/dL (ref 5–24)

## 2022-08-19 LAB — C3 AND C4
C3 Complement: 130 mg/dL (ref 83–193)
C4 Complement: 15 mg/dL (ref 15–57)

## 2022-08-19 LAB — ANTI-DNA ANTIBODY, DOUBLE-STRANDED: ds DNA Ab: 2 IU/mL

## 2022-08-19 LAB — SEDIMENTATION RATE: Sed Rate: 29 mm/h — ABNORMAL HIGH (ref 0–20)

## 2022-08-19 NOTE — Progress Notes (Signed)
Complements WNL

## 2022-08-19 NOTE — Progress Notes (Signed)
Creatinine is elevated-1.15.  GFR is 64.  Rest of CMP WNL.  Please clarify if she has been taking any NSAIDs? Encourage patient to increase water intake and avoid NSAIDs.  CBC WNL.  ESR remains borderline elevated but stable.  Protein creatinine ratio WNL

## 2022-08-20 NOTE — Progress Notes (Signed)
dsDNA is negative.  Labs are not consistent with a flare.

## 2022-09-09 ENCOUNTER — Ambulatory Visit: Payer: 59 | Admitting: Physician Assistant

## 2022-09-09 ENCOUNTER — Encounter: Payer: Self-pay | Admitting: Physician Assistant

## 2022-09-09 DIAGNOSIS — M25572 Pain in left ankle and joints of left foot: Secondary | ICD-10-CM | POA: Diagnosis not present

## 2022-09-09 NOTE — Progress Notes (Signed)
Office Visit Note   Patient: Tammy Giles           Date of Birth: 1986/01/03           MRN: [id number] Visit Date: 09/09/2022              Requested by: Tammy Reichmann, DO [address] [place],  [place] [address] PCP: Tammy Reichmann, DO  Chief Complaint  Patient presents with   Left Ankle - Pain, Follow-up      HPI: Tammy Giles is a pleasant 37 year old woman with a 84-month history of left medial ankle pain.  6 months ago she did sustain a trauma to her medial ankle when she stepped in a hole.  She did have an x-ray which demonstrated a subacute fragmented fracture of the medial malleolus.  She followed up with me a few months after the injury because she had continued pain.  She had tenderness over the medial malleolus and the deltoid.  We did try bracing and physical therapy for which she has been quite compliant.  She still comes in today and struggles with medial sided ankle pain which makes it difficult for her to do activities she enjoys  Assessment & Plan: Visit Diagnoses:  1. Pain in left ankle and joints of left foot     Plan: 84-month history of medial ankle injury without improvement despite bracing and physical therapy.  Had a discussion with her about this I think at this point we need to get an MRI scan of her ankle she can then follow-up with Dr. Lajoyce Giles to review this and therapeutic suggestions.  Follow-Up Instructions: With Dr. Lajoyce Giles after MRI  Ortho Exam  Patient is alert, oriented, no adenopathy, well-dressed, normal affect, normal respiratory effort. Examination of her left ankle her foot is warm she has a strong pulse compartments are soft and nontender she has fairly good deltoid strength and external rotation she is tender over the medial malleolus no real tenderness laterally.  She is neurovascular intact  Imaging: No results found. No images are attached to the encounter.  Labs: Lab Results  Component Value Date   HGBA1C 5.2 05/19/2022    HGBA1C 5.1 01/29/2022   ESRSEDRATE 29 (H) 08/18/2022   ESRSEDRATE 28 (H) 05/19/2022   ESRSEDRATE 28 (H) 01/29/2022   LABURIC 5.4 10/14/2019   LABORGA No Neisseria gonorrhoeae Isolated 11/06/2015     No results found for: "ALBUMIN", "PREALBUMIN", "CBC"  No results found for: "MG" Lab Results  Component Value Date   VD25OH 32 07/29/2021   VD25OH 30 09/19/2020   VD25OH 30 10/14/2019    No results found for: "PREALBUMIN"    Latest Ref Rng & Units 08/18/2022    2:02 PM 05/19/2022    1:42 PM 01/29/2022    9:46 AM  CBC EXTENDED  WBC 3.8 - 10.8 Thousand/uL 6.3  8.3  7.3   RBC 3.80 - 5.10 Million/uL 4.18  4.05  4.18   Hemoglobin 11.7 - 15.5 g/dL 19.1  47.8  29.5   HCT 35.0 - 45.0 % 40.0  38.8  39.9   Platelets 140 - 400 Thousand/uL 233  216  214   NEUT# 1,500 - 7,800 cells/uL 3,629  5,769  4,884   Lymph# 850 - 3,900 cells/uL 1,865  1,751.3  1,796      There is no height or weight on file to calculate BMI.  Orders:  Orders Placed This Encounter  Procedures   MR Ankle Left w/o contrast   Ambulatory  referral to Orthopedic Surgery   No orders of the defined types were placed in this encounter.    Procedures: No procedures performed  Clinical Data: No additional findings.  ROS:  All other systems negative, except as noted in the HPI. Review of Systems  Objective: Vital Signs: There were no vitals taken for this visit.  Specialty Comments:  INDICATION: Low back pain, unspecified back pain laterality, unspecified chronicity, unspecified whether sciatica present   COMPARISON: Plain films of the lumbar spine 11/07/2021   TECHNIQUE: MRI SPINE LUMBAR WO IV CONTRAST.   FINDINGS:  # Osseous structures: Vertebral body heights are maintained. No acute fracture. No pars defect appreciated. No destructive bony changes.  #  Alignment:No significant subluxation.  #  Conus medullaris/cauda equina: Conus appears normal and terminates at L1-2.   #  Lower thoracic spine: No  significant spinal canal or foraminal stenosis.   #  T12-L1: Preservation of disc height. No focal disc herniation, significant spinal canal or foraminal stenosis.  #  L1-L2: Preservation of disc height. No focal disc herniation, significant spinal canal or foraminal stenosis.  #  L2-L3: Preservation of disc height. No focal disc herniation, significant spinal canal or foraminal stenosis.  #  L3-L4: Preservation of disc height. No focal disc herniation, significant spinal canal or foraminal stenosis.  #  L4-L5: Preservation of disc height. No focal disc herniation, significant spinal canal or foraminal stenosis.  #  L5-S1: Preservation of disc height. No focal disc herniation, significant spinal canal or foraminal stenosis.   #  Paraspinal tissues: Unremarkable   #  Contrast: None given.   #  Additional comments: None.    IMPRESSION:  1. No focal disc herniation or significant stenosis. No nerve root impingement appreciated.  2.  No acute bony abnormality.   Electronically Signed by: Tammy Fava, MD on 03/07/2022 1:08 PM  PMFS History: Patient Active Problem List   Diagnosis Date Noted   Pain in left ankle and joints of left foot 03/18/2022   Cervical strain 12/11/2021   Class 2 obesity without serious comorbidity with body mass index (BMI) of 35.0 to 35.9 in adult 02/13/2017   Raynaud's syndrome without gangrene 02/13/2017   High risk medication use 02/13/2017   Dyslipidemia 12/12/2015   Vitamin D deficiency 12/12/2015   Annual physical exam 12/11/2015   Migraine without aura and with status migrainosus, not intractable 12/11/2015   Obesity (BMI 35.0-39.9 without comorbidity) 12/11/2015   Encounter for long-term (current) use of high-risk medication 04/26/2014   Abnormal Pap smear of cervix 06/16/2013    Class: History of   Lupus (systemic lupus erythematosus) (HCC) 10/22/2011   Past Medical History:  Diagnosis Date   Abnormal Pap smear of cervix    LGSIL 2014 & 2015,  ASCUS HPV HR+ 2016, ASCUS HPV HR+ 2017, 12-26-16 LGSIL   Achilles rupture, left 05/13/2014   Irregular periods    has IUD   Lupus (HCC)    Migraines    STD (sexually transmitted disease)    HSV2    Family History  Problem Relation Age of Onset   Hypertension Mother    Hypertension Father    Depression Father    Prostate cancer Father    Heart disease Father    Thyroid disease Sister     Past Surgical History:  Procedure Laterality Date   ACHILLES TENDON SURGERY Left 05/24/2014   Procedure: LEFT ACHILLES TENDON REPAIR;  Surgeon: Tarry Kos, MD;  Location: [facility];  Service:  Orthopedics;  Laterality: Left;   COLPOSCOPY     2014, 2015, 2016, 2017 LGSIL, 2018   INTRAUTERINE DEVICE (IUD) INSERTION     insertion 07-18-16   WISDOM TOOTH EXTRACTION     Social History   Occupational History    Employer: [employer]  Tobacco Use   Smoking status: Never    Passive exposure: Never   Smokeless tobacco: Never  Vaping Use   Vaping status: Never Used  Substance and Sexual Activity   Alcohol use: Yes    Alcohol/week: 3.0 standard drinks of alcohol    Types: 3 Standard drinks or equivalent per week    Comment: occ   Drug use: No   Sexual activity: Yes    Partners: Male    Birth control/protection: I.U.D.

## 2022-09-21 ENCOUNTER — Ambulatory Visit
Admission: RE | Admit: 2022-09-21 | Discharge: 2022-09-21 | Disposition: A | Discharge status: Home / Self Care | Payer: 59 | Source: Ambulatory Visit | Attending: Physician Assistant | Admitting: Physician Assistant

## 2022-09-21 DIAGNOSIS — M25572 Pain in left ankle and joints of left foot: Secondary | ICD-10-CM

## 2022-10-02 ENCOUNTER — Ambulatory Visit: Payer: 59 | Admitting: Orthopedic Surgery

## 2022-10-02 ENCOUNTER — Encounter: Payer: Self-pay | Admitting: Orthopedic Surgery

## 2022-10-02 DIAGNOSIS — M25572 Pain in left ankle and joints of left foot: Secondary | ICD-10-CM | POA: Diagnosis not present

## 2022-10-02 NOTE — Progress Notes (Signed)
Office Visit Note   Patient: Tammy Giles           Date of Birth: 01-14-1986           MRN: [id number] Visit Date: 10/02/2022              Requested by: Persons, [place], [place] [address] [place],  [place] [address] PCP: Irena Reichmann, DO  Chief Complaint  Patient presents with   Left Ankle - Follow-up    MRI review       HPI: Patient is a 37 year old woman who has pain along the medial malleolus left ankle she is status post an MRI scan.  She does have a history of lupus.  Assessment & Plan: Visit Diagnoses:  1. Pain in left ankle and joints of left foot     Plan: Recommended sole orthotics with a met pad.  Recommended fascial strengthening with toe raises.  Follow-Up Instructions: Return if symptoms worsen or fail to improve.   Ortho Exam  Patient is alert, oriented, no adenopathy, well-dressed, normal affect, normal respiratory effort. Examination patient is a good dorsalis pedis and posterior tibial pulse she has pes planus but no pronation and valgus.  With toe raising she has good hindfoot varus and reconstitutes the arch.  No point tenderness to palpation along the posterior tibial tendon.  Review of the MRI scan does show some mild tendinitis.  Patient is status post Achilles tendon repair and has some nodules consistent with retained suture.  Imaging: No results found. No images are attached to the encounter.  Labs: Lab Results  Component Value Date   HGBA1C 5.2 05/19/2022   HGBA1C 5.1 01/29/2022   ESRSEDRATE 29 (H) 08/18/2022   ESRSEDRATE 28 (H) 05/19/2022   ESRSEDRATE 28 (H) 01/29/2022   LABURIC 5.4 10/14/2019   LABORGA No Neisseria gonorrhoeae Isolated 11/06/2015     No results found for: "ALBUMIN", "PREALBUMIN", "CBC"  No results found for: "MG" Lab Results  Component Value Date   VD25OH 32 07/29/2021   VD25OH 30 09/19/2020   VD25OH 30 10/14/2019    No results found for: "PREALBUMIN"    Latest Ref Rng & Units 08/18/2022    2:02 PM  05/19/2022    1:42 PM 01/29/2022    9:46 AM  CBC EXTENDED  WBC 3.8 - 10.8 Thousand/uL 6.3  8.3  7.3   RBC 3.80 - 5.10 Million/uL 4.18  4.05  4.18   Hemoglobin 11.7 - 15.5 g/dL 62.1  30.8  65.7   HCT 35.0 - 45.0 % 40.0  38.8  39.9   Platelets 140 - 400 Thousand/uL 233  216  214   NEUT# 1,500 - 7,800 cells/uL 3,629  5,769  4,884   Lymph# 850 - 3,900 cells/uL 1,865  1,751.3  1,796      There is no height or weight on file to calculate BMI.  Orders:  No orders of the defined types were placed in this encounter.  No orders of the defined types were placed in this encounter.    Procedures: No procedures performed  Clinical Data: No additional findings.  ROS:  All other systems negative, except as noted in the HPI. Review of Systems  Objective: Vital Signs: There were no vitals taken for this visit.  Specialty Comments:  INDICATION: Low back pain, unspecified back pain laterality, unspecified chronicity, unspecified whether sciatica present   COMPARISON: Plain films of the lumbar spine 11/07/2021   TECHNIQUE: MRI SPINE LUMBAR WO IV CONTRAST.   FINDINGS:  #  Osseous structures: Vertebral body heights are maintained. No acute fracture. No pars defect appreciated. No destructive bony changes.  #  Alignment:No significant subluxation.  #  Conus medullaris/cauda equina: Conus appears normal and terminates at L1-2.   #  Lower thoracic spine: No significant spinal canal or foraminal stenosis.   #  T12-L1: Preservation of disc height. No focal disc herniation, significant spinal canal or foraminal stenosis.  #  L1-L2: Preservation of disc height. No focal disc herniation, significant spinal canal or foraminal stenosis.  #  L2-L3: Preservation of disc height. No focal disc herniation, significant spinal canal or foraminal stenosis.  #  L3-L4: Preservation of disc height. No focal disc herniation, significant spinal canal or foraminal stenosis.  #  L4-L5: Preservation of disc height.  No focal disc herniation, significant spinal canal or foraminal stenosis.  #  L5-S1: Preservation of disc height. No focal disc herniation, significant spinal canal or foraminal stenosis.   #  Paraspinal tissues: Unremarkable   #  Contrast: None given.   #  Additional comments: None.    IMPRESSION:  1. No focal disc herniation or significant stenosis. No nerve root impingement appreciated.  2.  No acute bony abnormality.   Electronically Signed by: Angie Fava, MD on 03/07/2022 1:08 PM  PMFS History: Patient Active Problem List   Diagnosis Date Noted   Pain in left ankle and joints of left foot 03/18/2022   Cervical strain 12/11/2021   Class 2 obesity without serious comorbidity with body mass index (BMI) of 35.0 to 35.9 in adult 02/13/2017   Raynaud's syndrome without gangrene 02/13/2017   High risk medication use 02/13/2017   Dyslipidemia 12/12/2015   Vitamin D deficiency 12/12/2015   Annual physical exam 12/11/2015   Migraine without aura and with status migrainosus, not intractable 12/11/2015   Obesity (BMI 35.0-39.9 without comorbidity) 12/11/2015   Encounter for long-term (current) use of high-risk medication 04/26/2014   Abnormal Pap smear of cervix 06/16/2013    Class: History of   Lupus (systemic lupus erythematosus) (HCC) 10/22/2011   Past Medical History:  Diagnosis Date   Abnormal Pap smear of cervix    LGSIL 2014 & 2015, ASCUS HPV HR+ 2016, ASCUS HPV HR+ 2017, 12-26-16 LGSIL   Achilles rupture, left 05/13/2014   Irregular periods    has IUD   Lupus (HCC)    Migraines    STD (sexually transmitted disease)    HSV2    Family History  Problem Relation Age of Onset   Hypertension Mother    Hypertension Father    Depression Father    Prostate cancer Father    Heart disease Father    Thyroid disease Sister     Past Surgical History:  Procedure Laterality Date   ACHILLES TENDON SURGERY Left 05/24/2014   Procedure: LEFT ACHILLES TENDON REPAIR;  Surgeon:  Tarry Kos, MD;  Location: [facility];  Service: Orthopedics;  Laterality: Left;   COLPOSCOPY     2014, 2015, 2016, 2017 LGSIL, 2018   INTRAUTERINE DEVICE (IUD) INSERTION     insertion 07-18-16   WISDOM TOOTH EXTRACTION     Social History   Occupational History    Employer: [employer]  Tobacco Use   Smoking status: Never    Passive exposure: Never   Smokeless tobacco: Never  Vaping Use   Vaping status: Never Used  Substance and Sexual Activity   Alcohol use: Yes    Alcohol/week: 3.0 standard drinks of alcohol  Types: 3 Standard drinks or equivalent per week    Comment: occ   Drug use: No   Sexual activity: Yes    Partners: Male    Birth control/protection: I.U.D.

## 2022-10-20 ENCOUNTER — Encounter: Payer: Self-pay | Admitting: Emergency Medicine

## 2022-10-20 ENCOUNTER — Other Ambulatory Visit: Payer: Self-pay

## 2022-10-20 DIAGNOSIS — R1033 Periumbilical pain: Secondary | ICD-10-CM | POA: Diagnosis present

## 2022-10-20 LAB — CBC WITH DIFFERENTIAL/PLATELET
Abs Immature Granulocytes: 0.01 10*3/uL (ref 0.00–0.07)
Basophils Absolute: 0 10*3/uL (ref 0.0–0.1)
Basophils Relative: 0 %
Eosinophils Absolute: 0.1 10*3/uL (ref 0.0–0.5)
Eosinophils Relative: 1 %
HCT: 40.3 % (ref 36.0–46.0)
Hemoglobin: 13.6 g/dL (ref 12.0–15.0)
Immature Granulocytes: 0 %
Lymphocytes Relative: 20 %
Lymphs Abs: 1.9 10*3/uL (ref 0.7–4.0)
MCH: 32.5 pg (ref 26.0–34.0)
MCHC: 33.7 g/dL (ref 30.0–36.0)
MCV: 96.2 fL (ref 80.0–100.0)
Monocytes Absolute: 0.6 10*3/uL (ref 0.1–1.0)
Monocytes Relative: 7 %
Neutro Abs: 6.6 10*3/uL (ref 1.7–7.7)
Neutrophils Relative %: 72 %
Platelets: 226 10*3/uL (ref 150–400)
RBC: 4.19 MIL/uL (ref 3.87–5.11)
RDW: 12.2 % (ref 11.5–15.5)
WBC: 9.3 10*3/uL (ref 4.0–10.5)
nRBC: 0 % (ref 0.0–0.2)

## 2022-10-20 LAB — POC URINE PREG, ED: Preg Test, Ur: NEGATIVE

## 2022-10-20 NOTE — ED Triage Notes (Signed)
Patient ambulatory to triage with steady gait, without difficulty or distress noted; pt reports for several wks having mid lower abd pain with no accomp symptoms; tonight pain has increased to left lower side radiating into back

## 2022-10-21 ENCOUNTER — Emergency Department
Admission: EM | Admit: 2022-10-21 | Discharge: 2022-10-21 | Disposition: A | Discharge status: Home / Self Care | Payer: 59 | Attending: Emergency Medicine | Admitting: Emergency Medicine

## 2022-10-21 ENCOUNTER — Emergency Department: Payer: 59

## 2022-10-21 DIAGNOSIS — R1030 Lower abdominal pain, unspecified: Secondary | ICD-10-CM

## 2022-10-21 LAB — COMPREHENSIVE METABOLIC PANEL
ALT: 19 U/L (ref 0–44)
AST: 30 U/L (ref 15–41)
Albumin: 3.9 g/dL (ref 3.5–5.0)
Alkaline Phosphatase: 80 U/L (ref 38–126)
Anion gap: 9 (ref 5–15)
BUN: 19 mg/dL (ref 6–20)
CO2: 26 mmol/L (ref 22–32)
Calcium: 9 mg/dL (ref 8.9–10.3)
Chloride: 101 mmol/L (ref 98–111)
Creatinine, Ser: 1.26 mg/dL — ABNORMAL HIGH (ref 0.44–1.00)
GFR, Estimated: 57 mL/min — ABNORMAL LOW (ref >60)
Glucose, Bld: 114 mg/dL — ABNORMAL HIGH (ref 70–99)
Potassium: 4 mmol/L (ref 3.5–5.1)
Sodium: 136 mmol/L (ref 135–145)
Total Bilirubin: 0.4 mg/dL (ref 0.3–1.2)
Total Protein: 8.2 g/dL — ABNORMAL HIGH (ref 6.5–8.1)

## 2022-10-21 LAB — URINALYSIS, MICROSCOPIC (REFLEX): Bacteria, UA: NONE SEEN

## 2022-10-21 LAB — URINALYSIS, ROUTINE W REFLEX MICROSCOPIC
Bilirubin Urine: NEGATIVE
Glucose, UA: NEGATIVE mg/dL
Ketones, ur: NEGATIVE mg/dL
Leukocytes,Ua: NEGATIVE
Nitrite: NEGATIVE
Protein, ur: NEGATIVE mg/dL
Specific Gravity, Urine: 1.02 (ref 1.005–1.030)
pH: 7 (ref 5.0–8.0)

## 2022-10-21 LAB — LIPASE, BLOOD: Lipase: 34 U/L (ref 11–51)

## 2022-10-21 NOTE — Discharge Instructions (Signed)
Please take Tylenol and ibuprofen/Advil for your pain.  It is safe to take them together, or to alternate them every few hours.  Take up to 1000mg of Tylenol at a time, up to 4 times per day.  Do not take more than 4000 mg of Tylenol in 24 hours.  For ibuprofen, take 400-600 mg, 3 - 4 times per day.  

## 2022-10-21 NOTE — ED Provider Notes (Signed)
[facility] Provider Note    Event Date/Time   First MD Initiated Contact with Patient 10/21/22 [phone]     (approximate)   History   Abdominal Pain   HPI  Tammy Giles is a 37 y.o. female who presents to the ED for evaluation of Abdominal Pain   Patient presents for evaluation of acute on subacute lower abdominal pain.  She reports that has been multiple weeks of intermittent pain that is periumbilical and bilateral lower abdomen.  Reports that has been worse this week alongside her menstrual cycle.  Further reports that this same quality pain was of a more severe intensity this evening, which is why she presents to the ED.  Reports tolerating p.o. without emesis or stool changes, no urinary changes.  No vaginal discharge or bleeding beyond her typical cycle that she is still on.  No history of abdominal surgeries.  No fevers.  She took some ibuprofen prior to arrival with improvement of her pain, and reports her pain is okay right now but because of the severity that was worse today she wanted to get checked out make sure nothing was wrong.   Physical Exam   Triage Vital Signs: ED Triage Vitals  Encounter Vitals Group     BP 10/20/22 2333 (!) 140/87     Systolic BP Percentile --      Diastolic BP Percentile --      Pulse Rate 10/20/22 2333 69     Resp 10/20/22 2333 18     Temp 10/20/22 2333 98 F (36.7 C)     Temp Source 10/20/22 2333 Oral     SpO2 10/20/22 2333 97 %     Weight 10/20/22 2325 275 lb (124.7 kg)     Height 10/20/22 2325 5\' 5"  (1.651 m)     Head Circumference --      Peak Flow --      Pain Score 10/20/22 2325 7     Pain Loc --      Pain Education --      Exclude from Growth Chart --     Most recent vital signs: Vitals:   10/21/22 0131 10/21/22 0254  BP:  122/78  Pulse: 84 84  Resp:  18  Temp:  98 F (36.7 C)  SpO2: 97% 98%    General: Awake, no distress.  CV:  Good peripheral perfusion.  Resp:  Normal effort.   Abd:  No distention.  Diffuse and mild lower abdominal tenderness without peritoneal features.  Benign upper abdomen. MSK:  No deformity noted.  Neuro:  No focal deficits appreciated. Other:     ED Results / Procedures / Treatments   Labs (all labs ordered are listed, but only abnormal results are displayed) Labs Reviewed  COMPREHENSIVE METABOLIC PANEL - Abnormal; Notable for the following components:      Result Value   Glucose, Bld 114 (*)    Creatinine, Ser 1.26 (*)    Total Protein 8.2 (*)    GFR, Estimated 57 (*)    All other components within normal limits  URINALYSIS, ROUTINE W REFLEX MICROSCOPIC - Abnormal; Notable for the following components:   Hgb urine dipstick MODERATE (*)    All other components within normal limits  CBC WITH DIFFERENTIAL/PLATELET  LIPASE, BLOOD  URINALYSIS, MICROSCOPIC (REFLEX)  POC URINE PREG, ED    EKG   RADIOLOGY CT abdomen/pelvis interpreted by me without evidence of acute pathology.  Official radiology report(s): No results found.  PROCEDURES  and INTERVENTIONS:  Procedures  Medications - No data to display   IMPRESSION / MDM / ASSESSMENT AND PLAN / ED COURSE  I reviewed the triage vital signs and the nursing notes.  Differential diagnosis includes, but is not limited to, appendicitis, diverticulitis, menstrual cramps, IBS or functional pain, cystitis, PID  {Patient presents with symptoms of an acute illness or injury that is potentially life-threatening.  Patient presents with acute on subacute abdominal pain without evidence of acute pathology and suitable for outpatient management.  Look systemically well.  Mild and diffuse lower abdominal tenderness without peritoneal features.  Reassuring workup with normal UA, CBC and reassuring metabolic panel.  Normal lipase.  CT obtained due to her localized tenderness without evidence of acute derangements.  Her pain is controlled here with ibuprofen she took at home.  Suitable for  outpatient management.  Clinical Course as of 10/21/22 0309  Tue Oct 21, 2022  0250 Reassessed, comfortably asleep and awakens to vocal stimulation.  Reports feeling well still.  We discussed reassuring CT scan workup overall.  We discussed possible etiologies of symptoms, management at home and return precautions.  She expresses understanding and agreement [DS]    Clinical Course User Index [DS] Delton Prairie, MD     FINAL CLINICAL IMPRESSION(S) / ED DIAGNOSES   Final diagnoses:  Lower abdominal pain     Rx / DC Orders   ED Discharge Orders     None        Note:  This document was prepared using Dragon voice recognition software and may include unintentional dictation errors.   Delton Prairie, MD 10/21/22 [phone]

## 2022-10-21 NOTE — ED Notes (Signed)
Patient resting quietly in ER stretcher, denies needs at this time. VSS, call light within reach.

## 2022-10-21 NOTE — ED Notes (Signed)
Patient back from CT.

## 2022-10-21 NOTE — ED Notes (Signed)
Patient brought back to room 6, connected to monitor now, call light within reach. Denies needs at this time.

## 2022-10-21 NOTE — ED Notes (Signed)
Patient given discharge instructions including importance of follow up appt as needed with stated understanding. Patient stable and ambulatory with steady even gait on dispo.

## 2022-11-13 ENCOUNTER — Other Ambulatory Visit: Payer: Self-pay | Admitting: Physician Assistant

## 2022-11-13 DIAGNOSIS — M3219 Other organ or system involvement in systemic lupus erythematosus: Secondary | ICD-10-CM

## 2022-11-13 NOTE — Telephone Encounter (Signed)
Last Fill: 08/15/2022  Eye exam: 08/21/2021 WNL    Labs: 10/20/2022 Glucose 114, Creat. 1.26, GFR 57, Total Protein 8.2  Next Visit: 11/24/2022  Last Visit: 08/18/2022  DX: Other systemic lupus erythematosus with other organ involvement    Current Dose per office note 08/18/2022: Plaquenil 200 mg 1 tablet by mouth twice daily.   Patient advised she is due to update her PLQ eye exam. Patient will call today to schedule and let us know when it is scheduled for.   Okay to refill Plaquenil?

## 2022-11-24 ENCOUNTER — Ambulatory Visit: Payer: 59 | Attending: Physician Assistant | Admitting: Physician Assistant

## 2022-11-24 ENCOUNTER — Encounter: Payer: Self-pay | Admitting: Physician Assistant

## 2022-11-24 VITALS — BP 121/86 | HR 81 | Resp 16 | Ht 66.0 in | Wt 275.8 lb

## 2022-11-24 DIAGNOSIS — R5383 Other fatigue: Secondary | ICD-10-CM

## 2022-11-24 DIAGNOSIS — Z79899 Other long term (current) drug therapy: Secondary | ICD-10-CM | POA: Diagnosis not present

## 2022-11-24 DIAGNOSIS — M3219 Other organ or system involvement in systemic lupus erythematosus: Secondary | ICD-10-CM | POA: Diagnosis not present

## 2022-11-24 DIAGNOSIS — E785 Hyperlipidemia, unspecified: Secondary | ICD-10-CM

## 2022-11-24 DIAGNOSIS — Z8719 Personal history of other diseases of the digestive system: Secondary | ICD-10-CM

## 2022-11-24 DIAGNOSIS — G43001 Migraine without aura, not intractable, with status migrainosus: Secondary | ICD-10-CM

## 2022-11-24 DIAGNOSIS — I7300 Raynaud's syndrome without gangrene: Secondary | ICD-10-CM | POA: Diagnosis not present

## 2022-11-24 DIAGNOSIS — E559 Vitamin D deficiency, unspecified: Secondary | ICD-10-CM

## 2022-11-25 LAB — CBC WITH DIFFERENTIAL/PLATELET
Absolute Monocytes: 456 {cells}/uL (ref 200–950)
Basophils Absolute: 29 {cells}/uL (ref 0–200)
Basophils Relative: 0.5 %
Eosinophils Absolute: 63 {cells}/uL (ref 15–500)
Eosinophils Relative: 1.1 %
HCT: 40.4 % (ref 35.0–45.0)
Hemoglobin: 13.1 g/dL (ref 11.7–15.5)
Lymphs Abs: 1300 {cells}/uL (ref 850–3900)
MCH: 32 pg (ref 27.0–33.0)
MCHC: 32.4 g/dL (ref 32.0–36.0)
MCV: 98.5 fL (ref 80.0–100.0)
MPV: 11.5 fL (ref 7.5–12.5)
Monocytes Relative: 8 %
Neutro Abs: 3853 {cells}/uL (ref 1500–7800)
Neutrophils Relative %: 67.6 %
Platelets: 199 10*3/uL (ref 140–400)
RBC: 4.1 10*6/uL (ref 3.80–5.10)
RDW: 12 % (ref 11.0–15.0)
Total Lymphocyte: 22.8 %
WBC: 5.7 10*3/uL (ref 3.8–10.8)

## 2022-11-25 LAB — PROTEIN / CREATININE RATIO, URINE
Creatinine, Urine: 230 mg/dL (ref 20–275)
Protein/Creat Ratio: 70 mg/g{creat} (ref 24–184)
Protein/Creatinine Ratio: 0.07 mg/mg{creat} (ref 0.024–0.184)
Total Protein, Urine: 16 mg/dL (ref 5–24)

## 2022-11-25 LAB — COMPLETE METABOLIC PANEL WITH GFR
AG Ratio: 1.2 (calc) (ref 1.0–2.5)
ALT: 14 U/L (ref 6–29)
AST: 20 U/L (ref 10–30)
Albumin: 4 g/dL (ref 3.6–5.1)
Alkaline phosphatase (APISO): 61 U/L (ref 31–125)
BUN/Creatinine Ratio: 15 (calc) (ref 6–22)
BUN: 16 mg/dL (ref 7–25)
CO2: 26 mmol/L (ref 20–32)
Calcium: 9.4 mg/dL (ref 8.6–10.2)
Chloride: 103 mmol/L (ref 98–110)
Creat: 1.04 mg/dL — ABNORMAL HIGH (ref 0.50–0.97)
Globulin: 3.4 g/dL (ref 1.9–3.7)
Glucose, Bld: 85 mg/dL (ref 65–99)
Potassium: 4.1 mmol/L (ref 3.5–5.3)
Sodium: 137 mmol/L (ref 135–146)
Total Bilirubin: 0.4 mg/dL (ref 0.2–1.2)
Total Protein: 7.4 g/dL (ref 6.1–8.1)
eGFR: 72 mL/min/{1.73_m2} (ref >=60)

## 2022-11-25 LAB — ANTI-DNA ANTIBODY, DOUBLE-STRANDED: ds DNA Ab: 2 [IU]/mL

## 2022-11-25 LAB — VITAMIN D 25 HYDROXY (VIT D DEFICIENCY, FRACTURES): Vit D, 25-Hydroxy: 29 ng/mL — ABNORMAL LOW (ref 30–100)

## 2022-11-25 LAB — C3 AND C4
C3 Complement: 122 mg/dL (ref 83–193)
C4 Complement: 14 mg/dL — ABNORMAL LOW (ref 15–57)

## 2022-11-25 LAB — SEDIMENTATION RATE: Sed Rate: 11 mm/h (ref 0–20)

## 2022-11-25 NOTE — Progress Notes (Signed)
Creatinine is borderline elevated-1.04--improved. rest of CMP WNL.  CBC WNL.  ESR WNL Protein creatinine ratio WNL  Vitamin D is borderline low-recommend taking vitamin D 2000 units daily.

## 2022-11-25 NOTE — Progress Notes (Signed)
C4 is borderline low-14. C3 WNL

## 2022-11-26 NOTE — Progress Notes (Signed)
dsDNA is negative.  Labs are not consistent with a flare.

## 2023-02-11 ENCOUNTER — Other Ambulatory Visit: Payer: Self-pay | Admitting: Rheumatology

## 2023-02-11 DIAGNOSIS — M3219 Other organ or system involvement in systemic lupus erythematosus: Secondary | ICD-10-CM

## 2023-02-12 NOTE — Telephone Encounter (Signed)
Last Fill: 11/13/2022   Eye exam: 08/21/2021 WNL   Labs: 11/24/2022 Creatinine is borderline elevated-1.04--improved. rest of CMP WNL. CBC WNL.  Next Visit: 05/01/2023  Last Visit: 11/24/2022  DX:Other systemic lupus erythematosus with other organ involvement   Current Dose per office note 11/24/2022: Plaquenil 200 mg 1 tablet by mouth twice daily.   Patient advised she is due to update PLQ eye exam. Patient states she has updated it and will call the eye doctor to have them send results.   Okay to refill Plaquenil?

## 2023-03-21 ENCOUNTER — Other Ambulatory Visit: Payer: Self-pay | Admitting: Physician Assistant

## 2023-03-21 DIAGNOSIS — M3219 Other organ or system involvement in systemic lupus erythematosus: Secondary | ICD-10-CM

## 2023-03-23 NOTE — Telephone Encounter (Signed)
Last Fill: 02/12/2023 (30 day supply)  Eye exam: 01/28/2023 WNL    Labs: 11/24/2022 Creatinine is borderline elevated-1.04--improved. rest of CMP WNL. CBC WNL. ESR WNL Protein creatinine ratio WNL Vitamin D is borderline low-C4 is borderline low-14. C3 WNL   Next Visit: 05/01/2023  Last Visit: 11/24/2022  DX:Other systemic lupus erythematosus with other organ involvement   Current Dose per office note 11/24/2022: Plaquenil 200 mg 1 tablet by mouth twice daily   Okay to refill Plaquenil?

## 2023-04-17 NOTE — Progress Notes (Signed)
 Office Visit Note  Patient: Tammy Giles             Date of Birth: January 22, 1986           MRN: [id number]             PCP: Irena Reichmann, DO Referring: Irena Reichmann, DO Visit Date: 05/01/2023 Occupation: @GUAROCC @  Subjective:  Lump on right wrist  History of Present Illness: Tammy Giles is a 38 y.o. female with history of systemic lupus.  She states she continues to have fatigue, dry mouth, photosensitivity.  She denies any history of Raynaud's phenomenon or lymphadenopathy.  She states she has had recurrent lump on her right wrist for the last couple of years which has been there for the last 2 weeks.  None of the joints are swollen.  She states she has been experiencing some discomfort in her right TMJ and recently saw her dentist.  There might be some alignment issue.  There is no history of shortness of breath.  She has been taking hydroxychloroquine 200 mg twice a day without any interruption.  She still plans pregnancy.    Activities of Daily Living:  Patient reports morning stiffness for 0 minutes.   Patient Reports nocturnal pain.  Difficulty dressing/grooming: Denies Difficulty climbing stairs: Denies Difficulty getting out of chair: Denies Difficulty using hands for taps, buttons, cutlery, and/or writing: Denies  Review of Systems  Constitutional:  Positive for fatigue.  HENT:  Positive for mouth dryness. Negative for mouth sores.        Patient reports jaw pain   Eyes:  Negative for dryness.  Respiratory:  Negative for shortness of breath.   Cardiovascular:  Negative for palpitations and swelling in legs/feet.  Gastrointestinal:  Positive for constipation. Negative for blood in stool and diarrhea.  Endocrine: Negative for increased urination.  Genitourinary:  Negative for involuntary urination.  Musculoskeletal:  Positive for joint pain, joint pain, joint swelling, myalgias, muscle tenderness and myalgias. Negative for gait problem, muscle weakness and  morning stiffness.  Skin:  Negative for color change, rash, hair loss and sensitivity to sunlight.  Allergic/Immunologic: Positive for susceptible to infections.  Neurological:  Positive for dizziness. Negative for headaches.  Hematological:  Negative for swollen glands.  Psychiatric/Behavioral:  Negative for depressed mood and sleep disturbance. The patient is not nervous/anxious.     PMFS History:  Patient Active Problem List   Diagnosis Date Noted   Pain in left ankle and joints of left foot 03/18/2022   Cervical strain 12/11/2021   Class 2 obesity without serious comorbidity with body mass index (BMI) of 35.0 to 35.9 in adult 02/13/2017   Raynaud's syndrome without gangrene 02/13/2017   High risk medication use 02/13/2017   Dyslipidemia 12/12/2015   Vitamin D deficiency 12/12/2015   Annual physical exam 12/11/2015   Migraine without aura and with status migrainosus, not intractable 12/11/2015   Obesity (BMI 35.0-39.9 without comorbidity) 12/11/2015   Encounter for long-term (current) use of high-risk medication 04/26/2014   Abnormal Pap smear of cervix 06/16/2013    Class: History of   Lupus (systemic lupus erythematosus) (HCC) 10/22/2011    Past Medical History:  Diagnosis Date   Abnormal Pap smear of cervix    LGSIL 2014 & 2015, ASCUS HPV HR+ 2016, ASCUS HPV HR+ 2017, 12-26-16 LGSIL   Achilles rupture, left 05/13/2014   Irregular periods    has IUD   Lupus    Migraines    STD (sexually transmitted disease)  HSV2    Family History  Problem Relation Age of Onset   Hypertension Mother    Hypertension Father    Depression Father    Prostate cancer Father    Heart disease Father    Rheum arthritis Father    Thyroid disease Sister    Past Surgical History:  Procedure Laterality Date   ACHILLES TENDON SURGERY Left 05/24/2014   Procedure: LEFT ACHILLES TENDON REPAIR;  Surgeon: Tarry Kos, MD;  Location: [facility];  Service: Orthopedics;  Laterality:  Left;   COLPOSCOPY     2014, 2015, 2016, 2017 LGSIL, 2018   INTRAUTERINE DEVICE (IUD) INSERTION     insertion 07-18-16   WISDOM TOOTH EXTRACTION     Social History   Social History Narrative   Not on file   Immunization History  Administered Date(s) Administered   Influenza Split 11/20/2014   Influenza,inj,Quad PF,6+ Mos 12/11/2015   PFIZER(Purple Top)SARS-COV-2 Vaccination 07/17/2019, 08/07/2019, 01/11/2020   Tdap 02/18/2007, 12/26/2016     Objective: Vital Signs: BP 118/80 (BP Location: Left Arm, Patient Position: Sitting, Cuff Size: Large)   Pulse 66   Resp 16   Ht 5\' 5"  (1.651 m)   Wt 274 lb 6.4 oz (124.5 kg)   BMI 45.66 kg/m    Physical Exam Vitals and nursing note reviewed.  Constitutional:      Appearance: She is well-developed.  HENT:     Head: Normocephalic and atraumatic.  Eyes:     Conjunctiva/sclera: Conjunctivae normal.  Cardiovascular:     Rate and Rhythm: Normal rate and regular rhythm.     Heart sounds: Normal heart sounds.  Pulmonary:     Effort: Pulmonary effort is normal.     Breath sounds: Normal breath sounds.  Abdominal:     General: Bowel sounds are normal.     Palpations: Abdomen is soft.  Musculoskeletal:     Cervical back: Normal range of motion.  Lymphadenopathy:     Cervical: No cervical adenopathy.  Skin:    General: Skin is warm and dry.     Capillary Refill: Capillary refill takes less than 2 seconds.  Neurological:     Mental Status: She is alert and oriented to person, place, and time.  Psychiatric:        Behavior: Behavior normal.      Musculoskeletal Exam: Cervical, thoracic and lumbar spine were in good range of motion.  Shoulders, elbows, wrists, MCPs PIPs and DIPs with good range of motion.  She had some puffiness in right wrist joint and palpable ganglion cyst was noted.  Hip joints and knee joints in good range of motion.  There was no tenderness over ankles and MTPs.  CDAI Exam: CDAI Score: -- Patient Global: --;  Provider Global: -- Swollen: --; Tender: -- Joint Exam 05/01/2023   No joint exam has been documented for this visit   There is currently no information documented on the homunculus. Go to the Rheumatology activity and complete the homunculus joint exam.  Investigation: No additional findings.  Imaging: No results found.  Recent Labs: Lab Results  Component Value Date   WBC 5.7 11/24/2022   HGB 13.1 11/24/2022   PLT 199 11/24/2022   NA 137 11/24/2022   K 4.1 11/24/2022   CL 103 11/24/2022   CO2 26 11/24/2022   GLUCOSE 85 11/24/2022   BUN 16 11/24/2022   CREATININE 1.04 (H) 11/24/2022   BILITOT 0.4 11/24/2022   ALKPHOS 80 10/20/2022   AST 20  11/24/2022   ALT 14 11/24/2022   PROT 7.4 11/24/2022   ALBUMIN 3.9 10/20/2022   CALCIUM 9.4 11/24/2022   GFRAA 97 04/18/2020    Speciality Comments: PLQ Eye Exam: 01/28/2023 WNL @ [facility] Follow up in 12 months  Procedures:  No procedures performed Allergies: Amoxicillin, Other, and Ibuprofen   Assessment / Plan:     Visit Diagnoses: Other systemic lupus erythematosus with other organ involvement (HCC) - ANA >1:1280, + Smith, + RNP, + SSA:dry mouth , occasional rashes , photosensitivity: -Patient states that she continues to have fatigue, dry mouth, joint pain.  She denies any history of malar rash, photosensitivity or lymphadenopathy.  November 24, 2022 double-stranded ENA negative, complements C4 low at 14, sed rate normal.  Will check labs today.  Plan: Protein / creatinine ratio, urine, Anti-DNA antibody, double-stranded, C3 and C4, Sedimentation rate.  I decided not to obtain x-rays of her bilateral hands and her bilateral feet today as she is not on any contraception.  I will consider x-rays at the next visit if she is on her menstrual cycle.  High risk medication use - Plaquenil 200 mg 1 tablet by mouth twice daily.  Her IUD was removed in October 2023--planning pregnancy in the future. PLQ Eye Exam: 01/28/2023 - Plan: CBC  with Differential/Platelet, COMPLETE METABOLIC PANEL WITH GFR.  Information immunization was placed in the AVS.  Raynaud's syndrome without gangrene-currently not active.  Ganglion cyst -she notices puffiness on her right wrist joint intermittently.  Her ganglion cyst was palpable.  I will refer her to hand surgery.  Plan: Ambulatory referral to Orthopedic Surgery  Other fatigue-she continues to have some fatigue.  Vitamin D deficiency-vitamin D was low at 39 on November 24, 2022.  She has not been taking vitamin D.  She was advised to take vitamin D 2000 units daily.  Will recheck vitamin D with the next labs.  Migraine   Dyslipidemia  History of gastroesophageal reflux (GERD)  Orders: Orders Placed This Encounter  Procedures   Protein / creatinine ratio, urine   CBC with Differential/Platelet   COMPLETE METABOLIC PANEL WITH GFR   Anti-DNA antibody, double-stranded   C3 and C4   Sedimentation rate   Ambulatory referral to Orthopedic Surgery   No orders of the defined types were placed in this encounter.   Follow-Up Instructions: Return in about 5 months (around 10/01/2023) for Systemic lupus.   Pollyann Savoy, MD  Note - This record has been created using Animal nutritionist.  Chart creation errors have been sought, but may not always  have been located. Such creation errors do not reflect on  the standard of medical care.

## 2023-05-01 ENCOUNTER — Encounter: Payer: Self-pay | Admitting: Rheumatology

## 2023-05-01 ENCOUNTER — Ambulatory Visit: Payer: 59 | Attending: Rheumatology | Admitting: Rheumatology

## 2023-05-01 VITALS — BP 118/80 | HR 66 | Resp 16 | Ht 65.0 in | Wt 274.4 lb

## 2023-05-01 DIAGNOSIS — E559 Vitamin D deficiency, unspecified: Secondary | ICD-10-CM

## 2023-05-01 DIAGNOSIS — R5383 Other fatigue: Secondary | ICD-10-CM

## 2023-05-01 DIAGNOSIS — M6740 Ganglion, unspecified site: Secondary | ICD-10-CM

## 2023-05-01 DIAGNOSIS — M3219 Other organ or system involvement in systemic lupus erythematosus: Secondary | ICD-10-CM | POA: Diagnosis not present

## 2023-05-01 DIAGNOSIS — I7300 Raynaud's syndrome without gangrene: Secondary | ICD-10-CM

## 2023-05-01 DIAGNOSIS — Z79899 Other long term (current) drug therapy: Secondary | ICD-10-CM | POA: Diagnosis not present

## 2023-05-01 DIAGNOSIS — E785 Hyperlipidemia, unspecified: Secondary | ICD-10-CM

## 2023-05-01 DIAGNOSIS — G43001 Migraine without aura, not intractable, with status migrainosus: Secondary | ICD-10-CM

## 2023-05-01 DIAGNOSIS — Z8719 Personal history of other diseases of the digestive system: Secondary | ICD-10-CM

## 2023-05-01 NOTE — Patient Instructions (Addendum)
 Vaccines You are taking a medication(s) that can suppress your immune system.  The following immunizations are recommended: Flu annually Covid-19  Td/Tdap (tetanus, diphtheria, pertussis) every 10 years Pneumonia (Prevnar 15 then Pneumovax 23 at least 1 year apart.  Alternatively, can take Prevnar 20 without needing additional dose) Shingrix: 2 doses from 4 weeks to 6 months apart  Please check with your PCP to make sure you are up to date.     Please take vitamin D 2000 units daily.  Please use sunscreen 50 SPF or more and sun protection.

## 2023-05-02 LAB — COMPLETE METABOLIC PANEL WITH GFR
AG Ratio: 1 (calc) (ref 1.0–2.5)
ALT: 17 U/L (ref 6–29)
AST: 22 U/L (ref 10–30)
Albumin: 4 g/dL (ref 3.6–5.1)
Alkaline phosphatase (APISO): 75 U/L (ref 31–125)
BUN/Creatinine Ratio: 10 (calc) (ref 6–22)
BUN: 11 mg/dL (ref 7–25)
CO2: 25 mmol/L (ref 20–32)
Calcium: 9.3 mg/dL (ref 8.6–10.2)
Chloride: 103 mmol/L (ref 98–110)
Creat: 1.05 mg/dL — ABNORMAL HIGH (ref 0.50–0.97)
Globulin: 4 g/dL — ABNORMAL HIGH (ref 1.9–3.7)
Glucose, Bld: 70 mg/dL (ref 65–99)
Potassium: 4.8 mmol/L (ref 3.5–5.3)
Sodium: 135 mmol/L (ref 135–146)
Total Bilirubin: 0.4 mg/dL (ref 0.2–1.2)
Total Protein: 8 g/dL (ref 6.1–8.1)
eGFR: 71 mL/min/{1.73_m2} (ref >=60)

## 2023-05-04 NOTE — Progress Notes (Signed)
 CBC is normal.  Creatinine is mildly elevated and stable.  Urine protein creatinine ratio is normal.  Sed rate is mildly elevated.  Complements are normal.  Patient should avoid use of NSAIDs.

## 2023-05-05 LAB — COMPREHENSIVE METABOLIC PANEL
AG Ratio: 1 (calc) (ref 1.0–2.5)
ALT: 17 U/L (ref 6–29)
AST: 22 U/L (ref 10–30)
Albumin: 4 g/dL (ref 3.6–5.1)
Alkaline phosphatase (APISO): 75 U/L (ref 31–125)
BUN/Creatinine Ratio: 10 (calc) (ref 6–22)
BUN: 11 mg/dL (ref 7–25)
CO2: 25 mmol/L (ref 20–32)
Calcium: 9.3 mg/dL (ref 8.6–10.2)
Chloride: 103 mmol/L (ref 98–110)
Creat: 1.05 mg/dL — ABNORMAL HIGH (ref 0.50–0.97)
Globulin: 4 g/dL — ABNORMAL HIGH (ref 1.9–3.7)
Glucose, Bld: 70 mg/dL (ref 65–99)
Potassium: 4.8 mmol/L (ref 3.5–5.3)
Sodium: 135 mmol/L (ref 135–146)
Total Bilirubin: 0.4 mg/dL (ref 0.2–1.2)
Total Protein: 8 g/dL (ref 6.1–8.1)
eGFR: 71 mL/min/{1.73_m2} (ref >=60)

## 2023-05-05 LAB — CBC WITH DIFFERENTIAL/PLATELET
Absolute Lymphocytes: 2088 {cells}/uL (ref 850–3900)
Absolute Monocytes: 468 {cells}/uL (ref 200–950)
Basophils Absolute: 30 {cells}/uL (ref 0–200)
Basophils Relative: 0.5 %
Eosinophils Absolute: 90 {cells}/uL (ref 15–500)
Eosinophils Relative: 1.5 %
HCT: 40.1 % (ref 35.0–45.0)
Hemoglobin: 13.2 g/dL (ref 11.7–15.5)
MCH: 32.2 pg (ref 27.0–33.0)
MCHC: 32.9 g/dL (ref 32.0–36.0)
MCV: 97.8 fL (ref 80.0–100.0)
MPV: 11.7 fL (ref 7.5–12.5)
Monocytes Relative: 7.8 %
Neutro Abs: 3324 {cells}/uL (ref 1500–7800)
Neutrophils Relative %: 55.4 %
Platelets: 242 10*3/uL (ref 140–400)
RBC: 4.1 10*6/uL (ref 3.80–5.10)
RDW: 12 % (ref 11.0–15.0)
Total Lymphocyte: 34.8 %
WBC: 6 10*3/uL (ref 3.8–10.8)

## 2023-05-05 LAB — PROTEIN / CREATININE RATIO, URINE
Creatinine, Urine: 34 mg/dL (ref 20–275)
Total Protein, Urine: <4 mg/dL — ABNORMAL LOW (ref 5–24)

## 2023-05-05 LAB — C3 AND C4
C3 Complement: 144 mg/dL (ref 83–193)
C4 Complement: 17 mg/dL (ref 15–57)

## 2023-05-05 LAB — ANTI-DNA ANTIBODY, DOUBLE-STRANDED: ds DNA Ab: 3 [IU]/mL

## 2023-05-05 LAB — SEDIMENTATION RATE: Sed Rate: 36 mm/h — ABNORMAL HIGH (ref 0–20)

## 2023-05-05 NOTE — Progress Notes (Signed)
Double stranded DNA negative.

## 2023-06-21 ENCOUNTER — Other Ambulatory Visit: Payer: Self-pay | Admitting: Rheumatology

## 2023-06-21 DIAGNOSIS — M3219 Other organ or system involvement in systemic lupus erythematosus: Secondary | ICD-10-CM

## 2023-06-22 ENCOUNTER — Other Ambulatory Visit (INDEPENDENT_AMBULATORY_CARE_PROVIDER_SITE_OTHER)

## 2023-06-22 ENCOUNTER — Ambulatory Visit: Admitting: Orthopedic Surgery

## 2023-06-22 DIAGNOSIS — M67431 Ganglion, right wrist: Secondary | ICD-10-CM | POA: Diagnosis not present

## 2023-06-22 DIAGNOSIS — M25531 Pain in right wrist: Secondary | ICD-10-CM | POA: Diagnosis not present

## 2023-06-22 NOTE — Progress Notes (Signed)
 Tammy Giles - 38 y.o. female MRN [id number]  Date of birth: 10/20/1985  Office Visit Note: Visit Date: 06/22/2023 PCP: Pete Brand, DO Referred by: Nicholas Bari, MD  Subjective: No chief complaint on file.  HPI: Tammy Giles is a pleasant 38 y.o. female who presents today for evaluation of right wrist dorsal and radial sided cyst that is been present now intermittently for approximately 2 years.  She states that it will periodically grow and shrink in size.  Denies any significant pain in this region, is able to do her activities of daily living and work without restriction.  She works primarily on a Anim[employer].  Does have a history of lupus, currently on Plaquenil .  Pertinent ROS were reviewed with the patient and found to be negative unless otherwise specified above in HPI.   Visit Reason: right wrist Duration of symptoms: 2 years Hand dominance: right Occupation: computer work Diabetic: No Smoking: No Heart/Lung History: none Blood Thinners: none  Prior Testing/EMG: none Injections (Date):none Treatments:none Prior Surgery:none  History of lupus; size of cyst fluctuates Minimal pain  Assessment & Plan: Visit Diagnoses:  1. Ganglion cyst of dorsum of right wrist   2. Pain in right wrist     Plan: Extensive discussion was had with the patient today regarding her right wrist ganglion cyst.  This appears to be emanating from the first extensor dorsal compartment.  She does not have any significant pain on examination today, the cyst is quite minimal in appearance today, she states that it will occasionally be larger than this.  Currently, given her minimal size of the cyst and minimal symptoms, I recommended we continue with observation for the time being given its benign presentation.  X-rays were obtained today of the right wrist which do not show any significant bony abnormalities.  I did mention that should the cyst become larger or more symptomatic in the  near future, she should return to me for repeat evaluation and we can discuss potential aspiration versus surgical excision.  Discussed the underlying etiology and pathophysiology of this condition as well as risks and benefits of the surgical intervention.  Risks and benefits of the procedure were discussed, risks including but not limited to infection, bleeding, scarring, stiffness, nerve injury, tendon injury, vascular injury, recurrence of symptoms and need for subsequent operation. Patient expressed understanding, will return as needed.  I spent 45 minutes in the care of this patient today including review of previous documentation, imaging obtained, face-to-face time discussing all options regarding treatment and documenting the encounter.   Follow-up: No follow-ups on file.   Meds & Orders: No orders of the defined types were placed in this encounter.   Orders Placed This Encounter  Procedures   XR Wrist Complete Right     Procedures: No procedures performed      Clinical History: INDICATION: Low back pain, unspecified back pain laterality, unspecified chronicity, unspecified whether sciatica present   COMPARISON: Plain films of the lumbar spine 11/07/2021   TECHNIQUE: MRI SPINE LUMBAR WO IV CONTRAST.   FINDINGS:  # Osseous structures: Vertebral body heights are maintained. No acute fracture. No pars defect appreciated. No destructive bony changes.  #  Alignment:No significant subluxation.  #  Conus medullaris/cauda equina: Conus appears normal and terminates at L1-2.   #  Lower thoracic spine: No significant spinal canal or foraminal stenosis.   #  T12-L1: Preservation of disc height. No focal disc herniation, significant spinal canal or foraminal stenosis.  #  L1-L2:  Preservation of disc height. No focal disc herniation, significant spinal canal or foraminal stenosis.  #  L2-L3: Preservation of disc height. No focal disc herniation, significant spinal canal or foraminal  stenosis.  #  L3-L4: Preservation of disc height. No focal disc herniation, significant spinal canal or foraminal stenosis.  #  L4-L5: Preservation of disc height. No focal disc herniation, significant spinal canal or foraminal stenosis.  #  L5-S1: Preservation of disc height. No focal disc herniation, significant spinal canal or foraminal stenosis.   #  Paraspinal tissues: Unremarkable   #  Contrast: None given.   #  Additional comments: None.    IMPRESSION:  1. No focal disc herniation or significant stenosis. No nerve root impingement appreciated.  2.  No acute bony abnormality.   Electronically Signed by: Dickie Found, MD on 03/07/2022 1:08 PM  She reports that she has never smoked. She has never been exposed to tobacco smoke. She has never used smokeless tobacco. No results for input(s): "HGBA1C", "LABURIC" in the last 8760 hours.  Objective:   Vital Signs: There were no vitals taken for this visit.  Physical Exam  Gen: Well-appearing, in no acute distress; non-toxic CV: Regular Rate. Well-perfused. Warm.  Resp: Breathing unlabored on room air; no wheezing. Psych: Fluid speech in conversation; appropriate affect; normal thought process  Ortho Exam Right wrist: - Flexion/extension 70/60, matches contralateral side - Full composite fist without restriction - Pronation/supination 80/80 without evidence of instability - Mildly palpable mass along the dorsal radial aspect of the wrist just proximal to the wrist crease, measures approximately 0.5 cm x 0.5 cm, no tenderness - Negative Finkelstein, negative Eickhoff maneuver   Imaging: XR Wrist Complete Right Result Date: 06/22/2023 X-rays of the right wrist taken today.  There is no evidence of fracture or dislocation. There is no evidence of arthropathy or other focal bone abnormality. Soft tissues are unremarkable.    Past Medical/Family/Surgical/Social History: Medications & Allergies reviewed per EMR, new medications  updated. Patient Active Problem List   Diagnosis Date Noted   Pain in left ankle and joints of left foot 03/18/2022   Cervical strain 12/11/2021   Class 2 obesity without serious comorbidity with body mass index (BMI) of 35.0 to 35.9 in adult 02/13/2017   Raynaud's syndrome without gangrene 02/13/2017   High risk medication use 02/13/2017   Dyslipidemia 12/12/2015   Vitamin D  deficiency 12/12/2015   Annual physical exam 12/11/2015   Migraine without aura and with status migrainosus, not intractable 12/11/2015   Obesity (BMI 35.0-39.9 without comorbidity) 12/11/2015   Encounter for long-term (current) use of high-risk medication 04/26/2014   Abnormal Pap smear of cervix 06/16/2013    Class: History of   Lupus (systemic lupus erythematosus) (HCC) 10/22/2011   Past Medical History:  Diagnosis Date   Abnormal Pap smear of cervix    LGSIL 2014 & 2015, ASCUS HPV HR+ 2016, ASCUS HPV HR+ 2017, 12-26-16 LGSIL   Achilles rupture, left 05/13/2014   Irregular periods    has IUD   Lupus    Migraines    STD (sexually transmitted disease)    HSV2   Family History  Problem Relation Age of Onset   Hypertension Mother    Hypertension Father    Depression Father    Prostate cancer Father    Heart disease Father    Rheum arthritis Father    Thyroid disease Sister    Past Surgical History:  Procedure Laterality Date   ACHILLES TENDON SURGERY Left 05/24/2014  Procedure: LEFT ACHILLES TENDON REPAIR;  Surgeon: Wes Hamman, MD;  Location:  [facility];  Service: Orthopedics;  Laterality: Left;   COLPOSCOPY     2014, 2015, 2016, 2017 LGSIL, 2018   INTRAUTERINE DEVICE (IUD) INSERTION     insertion 07-18-16   WISDOM TOOTH EXTRACTION     Social History   Occupational History    Employer: [employer]  Tobacco Use   Smoking status: Never    Passive exposure: Never   Smokeless tobacco: Never  Vaping Use   Vaping status: Never Used  Substance and Sexual Activity   Alcohol  use: Yes    Alcohol/week: 3.0 standard drinks of alcohol    Types: 3 Standard drinks or equivalent per week    Comment: rarely   Drug use: No   Sexual activity: Yes    Partners: Male    Birth control/protection: I.U.D.    Elwood Bazinet Alvia Jointer, M.D. [facility], Hand Surgery

## 2023-06-22 NOTE — Telephone Encounter (Signed)
 Last Fill: 03/23/2023  Eye exam: 01/28/2023   Labs: 05/01/2023 CBC is normal.  Creatinine is mildly elevated and stable.  Urine protein creatinine ratio is normal.  Sed rate is mildly elevated.  Complements are normal.  Patient should avoid use of NSAIDs . Double-stranded DNA negative.   Next Visit: 10/16/2023  Last Visit: 05/01/2023  DX: Other systemic lupus erythematosus with other organ involvement   Current Dose per office note 05/01/2023: Plaquenil  200 mg 1 tablet by mouth twice daily   Okay to refill Plaquenil ?

## 2023-09-22 ENCOUNTER — Other Ambulatory Visit: Payer: Self-pay | Admitting: Rheumatology

## 2023-09-22 DIAGNOSIS — M3219 Other organ or system involvement in systemic lupus erythematosus: Secondary | ICD-10-CM

## 2023-09-22 MED ORDER — HYDROXYCHLOROQUINE SULFATE 200 MG PO TABS: 200.0000 mg | ORAL_TABLET | Freq: Two times a day (BID) | ORAL | 0 refills | Status: DC

## 2023-09-22 NOTE — Telephone Encounter (Signed)
 Last Fill: 06/22/2023  Eye exam: 01/28/2023 WNL   Labs: 05/01/2023 CBC is normal.  Creatinine is mildly elevated and stable.  Urine protein creatinine ratio is normal.  Sed rate is mildly elevated.  Complements are normal.  Patient should avoid use of NSAIDs.   Next Visit: 10/16/2023   Last Visit: 05/01/2023  DX: Other systemic lupus erythematosus with other organ involvement   Current Dose per office note 05/01/2023: Plaquenil  200 mg 1 tablet by mouth twice daily   Okay to refill Plaquenil ?

## 2023-09-22 NOTE — Addendum Note (Signed)
 Addended by: Shunsuke Granzow M on: 09/22/2023 09:51 AM   Modules accepted: Orders

## 2023-09-22 NOTE — Addendum Note (Signed)
 Addended by: CENA ALFONSO CROME on: 09/22/2023 08:56 AM   Modules accepted: Orders

## 2023-10-02 NOTE — Progress Notes (Signed)
 Office Visit Note  Patient: Tammy Giles             Date of Birth: 1985-04-20           MRN: [id number]             PCP: Gerome Brunet, DO Referring: Gerome Brunet, DO Visit Date: 10/16/2023 Occupation: @GUAROCC @  Subjective:  Medication monitoring   History of Present Illness: Tammy Giles is a 38 y.o. female with history of systemic lupus. Patient remains on  Plaquenil  200 mg 1 tablet by mouth twice daily.  She continues to tolerate Plaquenil  without any side effects and has not had any recent gaps in therapy.  Patient states that she started IUI in July 2025.   Patient states that she has had several new concerns since her last office visit.  Patient states that she has had increased nausea, bloating, and appetite loss at times.  Patient states that sometimes the nausea is so severe that she will vomit.  Patient states that this was an issue even prior to starting IUI.  Patient requested a referral to GI for further evaluation and management. She continues to experience intermittent pain in both wrist joints.  She has started exercising at the gym again and has been weight lifting.  She denies any joint swelling at this time.  She denies any recent rashes.  She denies any oral or nasal ulcerations.  She is occasional bruises on her lower legs.  She denies any hair loss.  She has not had any symptoms of Raynaud's phenomenon recently.    Activities of Daily Living:  Patient reports morning stiffness for 0 minute.   Patient Reports nocturnal pain.  Difficulty dressing/grooming: Denies Difficulty climbing stairs: Denies Difficulty getting out of chair: Denies Difficulty using hands for taps, buttons, cutlery, and/or writing: Denies  Review of Systems  Constitutional:  Positive for fatigue.  HENT:  Negative for mouth sores and mouth dryness.   Eyes:  Negative for dryness.  Respiratory:  Positive for shortness of breath.   Cardiovascular:  Positive for chest pain. Negative  for palpitations.  Gastrointestinal:  Positive for constipation and diarrhea. Negative for blood in stool.  Endocrine: Positive for increased urination.  Genitourinary:  Negative for involuntary urination.  Musculoskeletal:  Positive for joint pain, joint pain, joint swelling, muscle weakness and muscle tenderness. Negative for gait problem, myalgias, morning stiffness and myalgias.  Skin:  Positive for sensitivity to sunlight. Negative for color change, rash and hair loss.  Allergic/Immunologic: Negative for susceptible to infections.  Neurological:  Positive for dizziness and headaches.  Hematological:  Positive for swollen glands.  Psychiatric/Behavioral:  Positive for sleep disturbance. Negative for depressed mood. The patient is nervous/anxious.     PMFS History:  Patient Active Problem List   Diagnosis Date Noted   Pain in left ankle and joints of left foot 03/18/2022   Cervical strain 12/11/2021   Class 2 obesity without serious comorbidity with body mass index (BMI) of 35.0 to 35.9 in adult 02/13/2017   Raynaud's syndrome without gangrene 02/13/2017   High risk medication use 02/13/2017   Dyslipidemia 12/12/2015   Vitamin D  deficiency 12/12/2015   Annual physical exam 12/11/2015   Migraine without aura and with status migrainosus, not intractable 12/11/2015   Obesity (BMI 35.0-39.9 without comorbidity) 12/11/2015   Encounter for long-term (current) use of high-risk medication 04/26/2014   Abnormal Pap smear of cervix 06/16/2013    Class: History of   Lupus (systemic lupus erythematosus) (HCC)  10/22/2011    Past Medical History:  Diagnosis Date   Abnormal Pap smear of cervix    LGSIL 2014 & 2015, ASCUS HPV HR+ 2016, ASCUS HPV HR+ 2017, 12-26-16 LGSIL   Achilles rupture, left 05/13/2014   Irregular periods    has IUD   Lupus    Migraines    STD (sexually transmitted disease)    HSV2    Family History  Problem Relation Age of Onset   Hypertension Mother    Hypertension  Father    Depression Father    Prostate cancer Father    Heart disease Father    Rheum arthritis Father    Thyroid disease Sister    Past Surgical History:  Procedure Laterality Date   ACHILLES TENDON SURGERY Left 05/24/2014   Procedure: LEFT ACHILLES TENDON REPAIR;  Surgeon: Kay CHRISTELLA Cummins, MD;  Location: [facility];  Service: Orthopedics;  Laterality: Left;   COLPOSCOPY     2014, 2015, 2016, 2017 LGSIL, 2018   INTRAUTERINE DEVICE (IUD) INSERTION     insertion 07-18-16   WISDOM TOOTH EXTRACTION     Social History   Social History Narrative   Not on file   Immunization History  Administered Date(s) Administered   Influenza Split 11/20/2014   Influenza,inj,Quad PF,6+ Mos 12/11/2015   PFIZER(Purple Top)SARS-COV-2 Vaccination 07/17/2019, 08/07/2019, 01/11/2020   Tdap 02/18/2007, 12/26/2016     Objective: Vital Signs: BP 131/87 (BP Location: Left Arm, Patient Position: Sitting, Cuff Size: Large)   Pulse 69   Resp 14   Ht 5' 5 (1.651 m)   Wt 278 lb 6.4 oz (126.3 kg)   LMP 10/01/2023 (Exact Date)   BMI 46.33 kg/m    Physical Exam Vitals and nursing note reviewed.  Constitutional:      Appearance: She is well-developed.  HENT:     Head: Normocephalic and atraumatic.  Eyes:     Conjunctiva/sclera: Conjunctivae normal.  Cardiovascular:     Rate and Rhythm: Normal rate and regular rhythm.     Heart sounds: Normal heart sounds.  Pulmonary:     Effort: Pulmonary effort is normal.     Breath sounds: Normal breath sounds.  Abdominal:     General: Bowel sounds are normal.     Palpations: Abdomen is soft.  Musculoskeletal:     Cervical back: Normal range of motion.  Lymphadenopathy:     Cervical: No cervical adenopathy.  Skin:    General: Skin is warm and dry.     Capillary Refill: Capillary refill takes less than 2 seconds.  Neurological:     Mental Status: She is alert and oriented to person, place, and time.  Psychiatric:        Behavior: Behavior  normal.      Musculoskeletal Exam: C-spine, thoracic spine, lumbar spine good range of motion.  Shoulder joints, elbow joints, wrist joints, MCPs, PIPs, DIPs have good range of motion with no synovitis.  Right wrist ganglion cyst.  Some tenderness of the left wrist on the dorsal aspect.  Hip joints have good range of motion with no groin pain.  Knee joints have good range of motion with no warmth or effusion.  Ankle joints have good range of motion with no tenderness or joint swelling.  CDAI Exam: CDAI Score: -- Patient Global: --; Provider Global: -- Swollen: --; Tender: -- Joint Exam 10/16/2023   No joint exam has been documented for this visit   There is currently no information documented on the homunculus.  Go to the Rheumatology activity and complete the homunculus joint exam.  Investigation: No additional findings.  Imaging: DG Foot Complete Left Result Date: 10/05/2023 EXAM: 3 or more VIEW(S) XRAY OF THE LEFT FOOT 10/05/2023 12:13:00 PM COMPARISON: 02/16/2017  without report. CLINICAL HISTORY: Left foot injury sustained yesterday, 10/04/2023. 38 year-old female states she rolled her LEFT foot yesterday when she stepped off a stair. C/o dorsal mid-foot pain. FINDINGS: BONES AND JOINTS: No acute fracture. No focal osseous lesion. No joint dislocation. SOFT TISSUES: The soft tissues are unremarkable. IMPRESSION: 1. No acute fracture or dislocation. Electronically signed by: Rockey Kilts MD 10/05/2023 12:24 PM EDT RP Workstation: [id number]    Recent Labs: Lab Results  Component Value Date   WBC 6.0 05/01/2023   HGB 13.2 05/01/2023   PLT 242 05/01/2023   NA 135 05/01/2023   NA 135 05/01/2023   K 4.8 05/01/2023   K 4.8 05/01/2023   CL 103 05/01/2023   CL 103 05/01/2023   CO2 25 05/01/2023   CO2 25 05/01/2023   GLUCOSE 70 05/01/2023   GLUCOSE 70 05/01/2023   BUN 11 05/01/2023   BUN 11 05/01/2023   CREATININE 1.05 (H) 05/01/2023   CREATININE 1.05 (H) 05/01/2023   BILITOT 0.4  05/01/2023   BILITOT 0.4 05/01/2023   ALKPHOS 80 10/20/2022   AST 22 05/01/2023   AST 22 05/01/2023   ALT 17 05/01/2023   ALT 17 05/01/2023   PROT 8.0 05/01/2023   PROT 8.0 05/01/2023   ALBUMIN 3.9 10/20/2022   CALCIUM 9.3 05/01/2023   CALCIUM 9.3 05/01/2023   GFRAA 97 04/18/2020    Speciality Comments: PLQ Eye Exam: 01/28/2023 WNL @ [facility] Follow up in 12 months  Procedures:  No procedures performed Allergies: Amoxicillin, Other, and Ibuprofen   Assessment / Plan:     Visit Diagnoses: Other systemic lupus erythematosus with other organ involvement (HCC) - ANA >1:1280, + Smith, + RNP, + SSA:dry mouth , occasional rashes , photosensitivity: Overall she is clinically been doing well taking Plaquenil  200 mg 1 tablet by mouth twice daily.  She is tolerating Plaquenil  without any side effects and has not had any interruptions in therapy.  No synovitis was noted on examination today.  She has not had any recent oral or nasal ulcerations, hair loss, or symptoms of Raynaud's phenomenon.  Discussed the importance of avoiding direct sun exposure.  No signs or symptoms of a systemic lupus flare were apparent. Lab work from 05/01/23 was reviewed today in the office: ESR 36, complements WNL, and dsDNA negative.  Plan to obtain the following lab work today for further evaluation.  She will remain on Plaquenil  as prescribed.  She was advised notify us  if she develops any signs or symptoms of a flare.  She will follow-up in the office in 5 months or sooner if needed.  - Plan: Protein / creatinine ratio, urine, CBC with Differential/Platelet, ANA, Anti-DNA antibody, double-stranded, C3 and C4, Sedimentation rate, Comprehensive metabolic panel with GFR, Lipid panel, RNP Antibody  High risk medication use - Plaquenil  200 mg 1 tablet by mouth twice daily.  Her IUD was removed in October 2023--planning pregnancy-started IUI in July 2025.  PLQ Eye Exam: 01/28/2023 WNL @ [facility] Follow up in 12 months   CBC and CMP updated on  05/01/23.  Orders for CBC and CMP released today.    - Plan: CBC with Differential/Platelet, Comprehensive metabolic panel with GFR  Raynaud's syndrome without gangrene: Not currently symptomatic.  Good capillary refill noted.  No signs of sclerodactyly.  Ganglion cyst: Evaluated by hand surgery 06/22/2023--Dr. Agarwala rec mended observation due to minimal size.   Other fatigue: Chronic, stable.  Other medical conditions are listed as follows:  Vitamin D  deficiency: She is taking a daily vitamin D  supplement.  Migraine   Dyslipidemia -Lipid panel obtained today as requested --results will be forwarded to her PCP.  Plan: Lipid panel  History of gastroesophageal reflux (GERD)  Nausea -Patient has been experiencing intermittent bouts of nausea, bloating, and appetite loss.  At times the nausea is so severe that she will vomit.  She has not had any blood in the stool, diarrhea, or constipation recently.  Patient has requested a referral to gastroenterology for further evaluation and management.  Plan: Ambulatory referral to Gastroenterology  Orders: Orders Placed This Encounter  Procedures   Protein / creatinine ratio, urine   CBC with Differential/Platelet   ANA   Anti-DNA antibody, double-stranded   C3 and C4   Sedimentation rate   Comprehensive metabolic panel with GFR   Lipid panel   RNP Antibody   Ambulatory referral to Gastroenterology   No orders of the defined types were placed in this encounter.    Follow-Up Instructions: Return in about 5 months (around 03/17/2024) for Systemic lupus erythematosus.   Waddell CHRISTELLA Craze, PA-C  Note - This record has been created using Dragon software.  Chart creation errors have been sought, but may not always  have been located. Such creation errors do not reflect on  the standard of medical care.

## 2023-10-05 ENCOUNTER — Ambulatory Visit (INDEPENDENT_AMBULATORY_CARE_PROVIDER_SITE_OTHER)

## 2023-10-05 ENCOUNTER — Ambulatory Visit
Admission: RE | Admit: 2023-10-05 | Discharge: 2023-10-05 | Disposition: A | Discharge status: Home / Self Care | Attending: Family Medicine | Admitting: Family Medicine

## 2023-10-05 VITALS — BP 123/80 | HR 62 | Temp 98.0°F | Resp 16

## 2023-10-05 DIAGNOSIS — S99922A Unspecified injury of left foot, initial encounter: Secondary | ICD-10-CM

## 2023-10-05 DIAGNOSIS — M79672 Pain in left foot: Secondary | ICD-10-CM | POA: Diagnosis not present

## 2023-10-05 NOTE — ED Provider Notes (Signed)
 [name]    CSN: [id number] Arrival date & time: 10/05/23  1131      History   Chief Complaint Chief Complaint  Patient presents with   Foot Injury    Fell twisting my foot yesterday. Can walk with limp but hurts with twisted a certain ways. - Entered by patient    HPI Tammy Giles is a 38 y.o. female.   HPI 38 year old female presents with left foot pain secondary to left foot injury twisting left foot yesterday.  PMH significant for lupus, obesity, and migraine.  Past Medical History:  Diagnosis Date   Abnormal Pap smear of cervix    LGSIL 2014 & 2015, ASCUS HPV HR+ 2016, ASCUS HPV HR+ 2017, 12-26-16 LGSIL   Achilles rupture, left 05/13/2014   Irregular periods    has IUD   Lupus    Migraines    STD (sexually transmitted disease)    HSV2    Patient Active Problem List   Diagnosis Date Noted   Pain in left ankle and joints of left foot 03/18/2022   Cervical strain 12/11/2021   Class 2 obesity without serious comorbidity with body mass index (BMI) of 35.0 to 35.9 in adult 02/13/2017   Raynaud's syndrome without gangrene 02/13/2017   High risk medication use 02/13/2017   Dyslipidemia 12/12/2015   Vitamin D  deficiency 12/12/2015   Annual physical exam 12/11/2015   Migraine without aura and with status migrainosus, not intractable 12/11/2015   Obesity (BMI 35.0-39.9 without comorbidity) 12/11/2015   Encounter for long-term (current) use of high-risk medication 04/26/2014   Abnormal Pap smear of cervix 06/16/2013    Class: History of   Lupus (systemic lupus erythematosus) (HCC) 10/22/2011    Past Surgical History:  Procedure Laterality Date   ACHILLES TENDON SURGERY Left 05/24/2014   Procedure: LEFT ACHILLES TENDON REPAIR;  Surgeon: Kay CHRISTELLA Cummins, MD;  Location: [facility];  Service: Orthopedics;  Laterality: Left;   COLPOSCOPY     2014, 2015, 2016, 2017 LGSIL, 2018   INTRAUTERINE DEVICE (IUD) INSERTION     insertion 07-18-16    WISDOM TOOTH EXTRACTION      OB History     Gravida  0   Para  0   Term  0   Preterm  0   AB  0   Living  0      SAB  0   IAB  0   Ectopic  0   Multiple  0   Live Births               Home Medications    Prior to Admission medications   Medication Sig Start Date End Date Taking? Authorizing Provider  acetaminophen  (TYLENOL ) 500 MG tablet Take 1,000 mg by mouth daily as needed for mild pain.    [provider]  Albuterol  Sulfate (PROAIR  RESPICLICK) 108 (90 Base) MCG/ACT AEPB Inhale into the lungs as needed. Patient not taking: Reported on 05/01/2023 02/24/18   [provider]  buPROPion (WELLBUTRIN SR) 200 MG 12 hr tablet Take 200 mg by mouth daily. 04/28/23   [provider]  cetirizine  (ZYRTEC ) 10 MG tablet Take 10 mg by mouth daily as needed. 12/05/18   [provider]  Cholecalciferol (VITAMIN D3 PO) Take by mouth.    [provider]  diphenhydrAMINE  (BENADRYL ) 25 MG tablet Take 1 tablet (25 mg total) by mouth every 6 (six) hours as needed. Take every 6 hours for the next 3 days,  then as needed for recurrent symptoms. 03/05/18   Odell Balls, PA-C  EPINEPHrine  (EPIPEN  2-PAK) 0.3 mg/0.3 mL IJ SOAJ injection Inject 0.3 mLs (0.3 mg total) into the muscle as needed for anaphylaxis. Patient not taking: Reported on 05/01/2023 03/05/18   Odell Balls, PA-C  hydroxychloroquine  (PLAQUENIL ) 200 MG tablet Take 1 tablet (200 mg total) by mouth 2 (two) times daily. 09/22/23   Cheryl Waddell HERO, PA-C  naproxen sodium (ALEVE) 220 MG tablet Take 440 mg by mouth daily as needed.    [provider]  Omega-3 Fatty Acids (OMEGA 3 PO) Take by mouth daily.    [provider]  Prenatal Vit-Fe Fumarate-FA (PRENATAL PO) Take by mouth daily.    [provider]    Family History Family History  Problem Relation Age of Onset   Hypertension Mother    Hypertension Father    Depression Father    Prostate cancer Father     Heart disease Father    Rheum arthritis Father    Thyroid disease Sister     Social History Social History   Tobacco Use   Smoking status: Never    Passive exposure: Never   Smokeless tobacco: Never  Vaping Use   Vaping status: Never Used  Substance Use Topics   Alcohol use: Yes    Alcohol/week: 3.0 standard drinks of alcohol    Types: 3 Standard drinks or equivalent per week    Comment: rarely   Drug use: No     Allergies   Amoxicillin, Other, and Ibuprofen   Review of Systems Review of Systems  Musculoskeletal:        Left foot pain x 1 day secondary to left foot injury     Physical Exam Triage Vital Signs ED Triage Vitals  Encounter Vitals Group     BP 10/05/23 1144 123/80     Girls Systolic BP Percentile --      Girls Diastolic BP Percentile --      Boys Systolic BP Percentile --      Boys Diastolic BP Percentile --      Pulse Rate 10/05/23 1144 62     Resp 10/05/23 1144 16     Temp 10/05/23 1144 98 F (36.7 C)     Temp Source 10/05/23 1144 Oral     SpO2 10/05/23 1144 100 %     Weight --      Height --      Head Circumference --      Peak Flow --      Pain Score 10/05/23 1142 5     Pain Loc --      Pain Education --      Exclude from Growth Chart --    No data found.  Updated Vital Signs BP 123/80 (BP Location: Right Arm)   Pulse 62   Temp 98 F (36.7 C) (Oral)   Resp 16   LMP 10/01/2023 (Exact Date)   SpO2 100%    Physical Exam Vitals and nursing note reviewed.  Constitutional:      Appearance: Normal appearance. She is obese.  HENT:     Head: Normocephalic and atraumatic.     Mouth/Throat:     Mouth: Mucous membranes are moist.     Pharynx: Oropharynx is clear.  Eyes:     Extraocular Movements: Extraocular movements intact.     Pupils: Pupils are equal, round, and reactive to light.  Cardiovascular:     Rate and Rhythm: Normal rate and  regular rhythm.     Pulses: Normal pulses.     Heart sounds: Normal heart sounds.   Pulmonary:     Effort: Pulmonary effort is normal.     Breath sounds: Normal breath sounds. No wheezing, rhonchi or rales.  Musculoskeletal:        General: Normal range of motion.     Comments: Left foot: No deformity noted  Skin:    General: Skin is warm and dry.  Neurological:     General: No focal deficit present.     Mental Status: She is alert and oriented to person, place, and time. Mental status is at baseline.  Psychiatric:        Mood and Affect: Mood normal.        Behavior: Behavior normal.      UC Treatments / Results  Labs (all labs ordered are listed, but only abnormal results are displayed) Labs Reviewed - No data to display  EKG   Radiology DG Foot Complete Left Result Date: 10/05/2023 EXAM: 3 or more VIEW(S) XRAY OF THE LEFT FOOT 10/05/2023 12:13:00 PM COMPARISON: 02/16/2017  without report. CLINICAL HISTORY: Left foot injury sustained yesterday, 10/04/2023. 38 year-old female states she rolled her LEFT foot yesterday when she stepped off a stair. C/o dorsal mid-foot pain. FINDINGS: BONES AND JOINTS: No acute fracture. No focal osseous lesion. No joint dislocation. SOFT TISSUES: The soft tissues are unremarkable. IMPRESSION: 1. No acute fracture or dislocation. Electronically signed by: Rockey Kilts MD 10/05/2023 12:24 PM EDT RP Workstation: [id number]    Procedures Procedures (including critical care time)  Medications Ordered in UC Medications - No data to display  Initial Impression / Assessment and Plan / UC Course  I have reviewed the triage vital signs and the nursing notes.  Pertinent labs & imaging results that were available during my care of the patient were reviewed by me and considered in my medical decision making (see chart for details).     MDM: 1.  Left foot pain-left foot x-ray results revealed above, patient advised; 2.  Injury of left foot, initial encounter-Advised patient of left foot x-ray results with hardcopy provided.  Advised  patient to RICE affected area of left foot for 30 minutes 3 times daily for the next 3 days. Advised if symptoms worsen and/or unresolved please follow-up with your PCP, [facility]opedics or here for further evaluation.  Patient discharged home, hemodynamically stable. Advised patient of left foot x-ray results with hardcopy provided.  Advised patient to RICE affected area of left foot for 30 minutes 3 times daily for the next 3 days. Advised if symptoms worsen and/or unresolved please follow-up with your PCP, [facility] orth[facility]ics or here for further evaluation.  Patient discharged home, hemodynamically stable.  Patient discharged home, hemodynamically stable. Final Clinical Impressions(s) / UC Diagnoses   Final diagnoses:  Left foot pain  Injury of left foot, initial encounter     Discharge Instructions      Advised patient of left foot x-ray results with hardcopy provided.  Advised patient to RICE affected area of left foot for 30 minutes 3 times daily for the next 3 days. Advised if symptoms worsen and/or unresolved please follow-up with your PCP, [facility]ics or here for further evaluation.     ED Prescriptions   None    PDMP not reviewed this encounter.   Teddy Sharper, FNP 10/05/23 1325

## 2023-10-05 NOTE — ED Triage Notes (Signed)
 Pt reports twisting left foot yesterday and now has constant pain. Denies any swelling. Denies taking any OTC medication

## 2023-10-05 NOTE — Discharge Instructions (Addendum)
 Advised patient of left foot x-ray results with hardcopy provided.  Advised patient to RICE affected area of left foot for 30 minutes 3 times daily for the next 3 days. Advised if symptoms worsen and/or unresolved please follow-up with your PCP, [facility]ics or here for further evaluation.

## 2023-10-16 ENCOUNTER — Ambulatory Visit: Attending: Physician Assistant | Admitting: Physician Assistant

## 2023-10-16 ENCOUNTER — Encounter: Payer: Self-pay | Admitting: Physician Assistant

## 2023-10-16 VITALS — BP 131/87 | HR 69 | Resp 14 | Ht 65.0 in | Wt 278.4 lb

## 2023-10-16 DIAGNOSIS — I7300 Raynaud's syndrome without gangrene: Secondary | ICD-10-CM | POA: Diagnosis not present

## 2023-10-16 DIAGNOSIS — M3219 Other organ or system involvement in systemic lupus erythematosus: Secondary | ICD-10-CM | POA: Diagnosis not present

## 2023-10-16 DIAGNOSIS — R110 Nausea: Secondary | ICD-10-CM

## 2023-10-16 DIAGNOSIS — G43001 Migraine without aura, not intractable, with status migrainosus: Secondary | ICD-10-CM

## 2023-10-16 DIAGNOSIS — M6740 Ganglion, unspecified site: Secondary | ICD-10-CM

## 2023-10-16 DIAGNOSIS — R5383 Other fatigue: Secondary | ICD-10-CM

## 2023-10-16 DIAGNOSIS — Z8719 Personal history of other diseases of the digestive system: Secondary | ICD-10-CM

## 2023-10-16 DIAGNOSIS — Z79899 Other long term (current) drug therapy: Secondary | ICD-10-CM | POA: Diagnosis not present

## 2023-10-16 DIAGNOSIS — E785 Hyperlipidemia, unspecified: Secondary | ICD-10-CM

## 2023-10-16 DIAGNOSIS — E559 Vitamin D deficiency, unspecified: Secondary | ICD-10-CM

## 2023-10-16 NOTE — Patient Instructions (Signed)

## 2023-10-19 ENCOUNTER — Ambulatory Visit: Payer: Self-pay | Admitting: Physician Assistant

## 2023-10-19 LAB — COMPREHENSIVE METABOLIC PANEL WITH GFR
AG Ratio: 1.1 (calc) (ref 1.0–2.5)
ALT: 12 U/L (ref 6–29)
AST: 19 U/L (ref 10–30)
Albumin: 4 g/dL (ref 3.6–5.1)
Alkaline phosphatase (APISO): 63 U/L (ref 31–125)
BUN: 12 mg/dL (ref 7–25)
CO2: 27 mmol/L (ref 20–32)
Calcium: 9.3 mg/dL (ref 8.6–10.2)
Chloride: 103 mmol/L (ref 98–110)
Creat: 0.97 mg/dL (ref 0.50–0.97)
Globulin: 3.6 g/dL (ref 1.9–3.7)
Glucose, Bld: 74 mg/dL (ref 65–99)
Potassium: 4.5 mmol/L (ref 3.5–5.3)
Sodium: 136 mmol/L (ref 135–146)
Total Bilirubin: 0.4 mg/dL (ref 0.2–1.2)
Total Protein: 7.6 g/dL (ref 6.1–8.1)
eGFR: 78 mL/min/1.73m2 (ref >=60)

## 2023-10-19 LAB — CBC WITH DIFFERENTIAL/PLATELET
Absolute Lymphocytes: 1931 {cells}/uL (ref 850–3900)
Absolute Monocytes: 514 {cells}/uL (ref 200–950)
Basophils Absolute: 33 {cells}/uL (ref 0–200)
Basophils Relative: 0.5 %
Eosinophils Absolute: 91 {cells}/uL (ref 15–500)
Eosinophils Relative: 1.4 %
HCT: 40.2 % (ref 35.0–45.0)
Hemoglobin: 13 g/dL (ref 11.7–15.5)
MCH: 31.9 pg (ref 27.0–33.0)
MCHC: 32.3 g/dL (ref 32.0–36.0)
MCV: 98.8 fL (ref 80.0–100.0)
MPV: 11.5 fL (ref 7.5–12.5)
Monocytes Relative: 7.9 %
Neutro Abs: 3933 {cells}/uL (ref 1500–7800)
Neutrophils Relative %: 60.5 %
Platelets: 236 Thousand/uL (ref 140–400)
RBC: 4.07 Million/uL (ref 3.80–5.10)
RDW: 12 % (ref 11.0–15.0)
Total Lymphocyte: 29.7 %
WBC: 6.5 Thousand/uL (ref 3.8–10.8)

## 2023-10-19 LAB — LIPID PANEL
Cholesterol: 174 mg/dL (ref <200)
HDL: 55 mg/dL (ref >=50)
LDL Cholesterol (Calc): 101 mg/dL — ABNORMAL HIGH
Non-HDL Cholesterol (Calc): 119 mg/dL (ref <130)
Total CHOL/HDL Ratio: 3.2 (calc) (ref <5.0)
Triglycerides: 87 mg/dL (ref <150)

## 2023-10-19 LAB — PROTEIN / CREATININE RATIO, URINE
Creatinine, Urine: 46 mg/dL (ref 20–275)
Total Protein, Urine: <4 mg/dL — ABNORMAL LOW (ref 5–24)

## 2023-10-19 LAB — ANTI-DNA ANTIBODY, DOUBLE-STRANDED: ds DNA Ab: 3 [IU]/mL

## 2023-10-19 LAB — C3 AND C4
C3 Complement: 140 mg/dL (ref 83–193)
C4 Complement: 18 mg/dL (ref 15–57)

## 2023-10-19 LAB — SEDIMENTATION RATE: Sed Rate: 28 mm/h — ABNORMAL HIGH (ref 0–20)

## 2023-10-19 LAB — RNP ANTIBODY: Ribonucleic Protein(ENA) Antibody, IgG: >8 AI — AB

## 2023-10-19 LAB — ANTI-NUCLEAR AB-TITER (ANA TITER): ANA Titer 1: 1:1280 {titer} — ABNORMAL HIGH

## 2023-10-19 LAB — ANA: Anti Nuclear Antibody (ANA): POSITIVE — AB

## 2023-10-19 NOTE — Progress Notes (Signed)
 No proteinuria  ANA remains positive ESR remains elevated but has improved-28.   Lipid panel WNL  RNP antibody remains positive.  CBC and CMPWNL dsDNA negative  Complements WNL Labs are not consistent with a flare.

## 2023-11-16 ENCOUNTER — Other Ambulatory Visit

## 2023-11-16 ENCOUNTER — Ambulatory Visit: Admitting: Physician Assistant

## 2023-11-16 ENCOUNTER — Encounter: Payer: Self-pay | Admitting: Physician Assistant

## 2023-11-16 VITALS — BP 120/76 | HR 66 | Ht 65.0 in | Wt 275.2 lb

## 2023-11-16 DIAGNOSIS — R110 Nausea: Secondary | ICD-10-CM

## 2023-11-16 DIAGNOSIS — R140 Abdominal distension (gaseous): Secondary | ICD-10-CM

## 2023-11-16 DIAGNOSIS — R109 Unspecified abdominal pain: Secondary | ICD-10-CM

## 2023-11-16 DIAGNOSIS — R1033 Periumbilical pain: Secondary | ICD-10-CM | POA: Diagnosis not present

## 2023-11-16 DIAGNOSIS — L249 Irritant contact dermatitis, unspecified cause: Secondary | ICD-10-CM

## 2023-11-16 DIAGNOSIS — K219 Gastro-esophageal reflux disease without esophagitis: Secondary | ICD-10-CM

## 2023-11-16 DIAGNOSIS — K649 Unspecified hemorrhoids: Secondary | ICD-10-CM

## 2023-11-16 DIAGNOSIS — K601 Chronic anal fissure: Secondary | ICD-10-CM

## 2023-11-16 LAB — COMPREHENSIVE METABOLIC PANEL WITH GFR
ALT: 17 U/L (ref 0–35)
AST: 23 U/L (ref 0–37)
Albumin: 4.2 g/dL (ref 3.5–5.2)
Alkaline Phosphatase: 63 U/L (ref 39–117)
BUN: 12 mg/dL (ref 6–23)
CO2: 28 meq/L (ref 19–32)
Calcium: 9.6 mg/dL (ref 8.4–10.5)
Chloride: 104 meq/L (ref 96–112)
Creatinine, Ser: 0.97 mg/dL (ref 0.40–1.20)
GFR: 75.19 mL/min (ref >60.00)
Glucose, Bld: 95 mg/dL (ref 70–99)
Potassium: 4.2 meq/L (ref 3.5–5.1)
Sodium: 137 meq/L (ref 135–145)
Total Bilirubin: 0.3 mg/dL (ref 0.2–1.2)
Total Protein: 8.3 g/dL (ref 6.0–8.3)

## 2023-11-16 LAB — CBC WITH DIFFERENTIAL/PLATELET
Basophils Absolute: 0 K/uL (ref 0.0–0.1)
Basophils Relative: 0.7 % (ref 0.0–3.0)
Eosinophils Absolute: 0.1 K/uL (ref 0.0–0.7)
Eosinophils Relative: 1.5 % (ref 0.0–5.0)
HCT: 41.2 % (ref 36.0–46.0)
Hemoglobin: 13.8 g/dL (ref 12.0–15.0)
Lymphocytes Relative: 29.2 % (ref 12.0–46.0)
Lymphs Abs: 1.7 K/uL (ref 0.7–4.0)
MCHC: 33.4 g/dL (ref 30.0–36.0)
MCV: 95.3 fl (ref 78.0–100.0)
Monocytes Absolute: 0.5 K/uL (ref 0.1–1.0)
Monocytes Relative: 9 % (ref 3.0–12.0)
Neutro Abs: 3.4 K/uL (ref 1.4–7.7)
Neutrophils Relative %: 59.6 % (ref 43.0–77.0)
Platelets: 213 K/uL (ref 150.0–400.0)
RBC: 4.33 Mil/uL (ref 3.87–5.11)
RDW: 13.2 % (ref 11.5–15.5)
WBC: 5.8 K/uL (ref 4.0–10.5)

## 2023-11-16 NOTE — Progress Notes (Unsigned)
 11/17/2023 Tammy Giles [id number] January 20, 1986  Referring provider: Gerome Brunet, DO Primary GI doctor: Dr. Suzann  ASSESSMENT AND PLAN:  Abdominal pain with bloating, nausea Feels periumblical knot, nausea, feeling warm, feels better after a BM Has BM daily but has incomplete Bm's, burning anal leakage, soft serve/mushy stools, no diarrhea 3-4 x a month has these episodes 10/2022 CTAP W0 unremarkable - Order abdominal ultrasound to rule out hernia. - Order abdominal x-ray to assess for constipation. - Check stool for inflammatory markers. - Encourage fiber intake and consider Miralax if x-ray shows stool retention.  GERD x 1 year Denies dysphagia, melena, some nausea but has migraines Was taking aleve regularly but stopped a few months ago with improvement of her symptoms, stopped ETOH in may New onset heartburn and acid reflux, exacerbated by spicy foods. No current medication for heartburn. - Check for H. pylori infection. - Recommend Pepcid  (famotidine ) for heartburn management. - Advise avoidance of spicy foods and NSAIDs like Aleve.  - consider Egd if not better or returns  SLE Follows with rheumatology last seen 10/16/2023 On Plaquenil   Anal fissure and hemorrhoids with perianal dermatitis Perianal discomfort with burning sensation and irritation. Possible anal fissure noted. - Prescribe medication for anal fissure and perianal dermatitis.   I have reviewed the clinic note as outlined by Alan Coombs, PA and agree with the assessment, plan and medical decision making.  Tammy Giles presents to the office for evaluation of abdominal pain with associated bloating, nausea and GERD.  Pain is periumbilical and described as a knot.  Although she is nauseated there is no emesis.  Bowel movements are more regular but described as soft or mushy in nature.  Reflux improved with cessation of NSAID.  Previous CT in 2024 was normal.  Agree with checking stool inflammatory  markers, H. pylori and trial of Pepcid .  Appropriate to treat for anal fissure.  Inocente Suzann, MD  Patient Care Team: Collins, Dana, DO as PCP - General (Family Medicine)  HISTORY OF PRESENT ILLNESS: 38 y.o. female with a past medical history listed below presents for evaluation of nausea, abdominal pain.   Previously saw Dr. Rollin a year ago for bloating.   Discussed the use of AI scribe software for clinical note transcription with the patient, who gave verbal consent to proceed.  History of Present Illness   Tammy Giles is a 38 year old female with systemic lupus erythematosus who presents with abdominal discomfort and bloating.  She experiences episodes of abdominal discomfort and bloating, described as a 'knot' near her umbilicus, occurring three to four times a month. These episodes are associated with nausea and a sensation of warmth, often improving after a bowel movement. She recalls a previous visit to a gastroenterologist in September 2024 for similar symptoms but does not remember the details of any tests or treatments conducted at that time. She also mentions a past hospital visit due to a sensation of her umbilicus 'popping out'.  Her bowel movements have become more regular, occurring daily, but she often feels they are incomplete. She describes her stools as 'mushy' and sometimes experiences leakage; she has wondered if she might have hemorrhoids due to discomfort and burning sensations. She also reports a burning sensation and irritation around the rectum, which she believes may be due to hemorrhoids. No dark or black stools, significant nausea outside of episodes, or trouble swallowing.  She has experienced new onset heartburn and acid reflux over the past year, which she associates with  spicy foods. She has stopped consuming alcohol since May due to nausea and vomiting after drinking. She previously used Aleve frequently but stopped a few months ago due to  concerns about kidney function.  She has a history of systemic lupus erythematosus diagnosed in 2013 and is currently taking Plaquenil . She is undergoing fertility treatments and has not had any abdominal surgeries. No family history of colon cancer or inflammatory bowel disease such as Crohn's or ulcerative colitis.      She  reports that she has never smoked. She has never been exposed to tobacco smoke. She has never used smokeless tobacco. She reports current alcohol use of about 3.0 standard drinks of alcohol per week. She reports that she does not use drugs.  RELEVANT GI HISTORY, IMAGING AND LABS: Results   LABS Erythrocyte Sedimentation Rate (ESR): Normal (10/16/2023) C-Reactive Protein (CRP): Normal  RADIOLOGY Abdominal CT: No significant findings (10/2022)      CBC    Component Value Date/Time   WBC 5.8 11/16/2023 1435   RBC 4.33 11/16/2023 1435   HGB 13.8 11/16/2023 1435   HGB 13.7 05/25/2013 1607   HCT 41.2 11/16/2023 1435   PLT 213.0 11/16/2023 1435   MCV 95.3 11/16/2023 1435   MCH 31.9 10/16/2023 1054   MCHC 33.4 11/16/2023 1435   RDW 13.2 11/16/2023 1435   LYMPHSABS 1.7 11/16/2023 1435   MONOABS 0.5 11/16/2023 1435   EOSABS 0.1 11/16/2023 1435   BASOSABS 0.0 11/16/2023 1435   Recent Labs    11/24/22 0928 05/01/23 1222 10/16/23 1054 11/16/23 1435  HGB 13.1 13.2 13.0 13.8    CMP     Component Value Date/Time   NA 137 11/16/2023 1435   K 4.2 11/16/2023 1435   CL 104 11/16/2023 1435   CO2 28 11/16/2023 1435   GLUCOSE 95 11/16/2023 1435   BUN 12 11/16/2023 1435   CREATININE 0.97 11/16/2023 1435   CREATININE 0.97 10/16/2023 1054   CALCIUM 9.6 11/16/2023 1435   PROT 8.3 11/16/2023 1435   ALBUMIN 4.2 11/16/2023 1435   AST 23 11/16/2023 1435   ALT 17 11/16/2023 1435   ALKPHOS 63 11/16/2023 1435   BILITOT 0.3 11/16/2023 1435   GFRNONAA 57 (L) 10/20/2022 2331   GFRNONAA 83 04/18/2020 0906   GFRAA 97 04/18/2020 0906      Latest Ref Rng & Units  11/16/2023    2:35 PM 10/16/2023   10:54 AM 05/01/2023   12:22 PM  Hepatic Function  Total Protein 6.0 - 8.3 g/dL 8.3  7.6  8.0    8.0   Albumin 3.5 - 5.2 g/dL 4.2     AST 0 - 37 U/L 23  19  22    22    ALT 0 - 35 U/L 17  12  17    17    Alk Phosphatase 39 - 117 U/L 63     Total Bilirubin 0.2 - 1.2 mg/dL 0.3  0.4  0.4    0.4       Current Medications:   Current Outpatient Medications (Endocrine & Metabolic):    FOLLISTIM AQ 600 UNT/0.72ML SOLN, INJECT 75 IU SUBCUTANEOUSLY DAILY AS DIRECTED   OVIDREL 250 MCG/0.5ML SOSY, Inject 1 Syringe into the skin once.  Current Outpatient Medications (Cardiovascular):    EPINEPHrine  (EPIPEN  2-PAK) 0.3 mg/0.3 mL IJ SOAJ injection, Inject 0.3 mLs (0.3 mg total) into the muscle as needed for anaphylaxis.  Current Outpatient Medications (Respiratory):    Albuterol  Sulfate (PROAIR  RESPICLICK) 108 (90 Base) MCG/ACT  AEPB, Inhale into the lungs as needed.   cetirizine  (ZYRTEC ) 10 MG tablet, Take 10 mg by mouth daily as needed.   diphenhydrAMINE  (BENADRYL ) 25 MG tablet, Take 1 tablet (25 mg total) by mouth every 6 (six) hours as needed. Take every 6 hours for the next 3 days, then as needed for recurrent symptoms.  Current Outpatient Medications (Analgesics):    acetaminophen  (TYLENOL ) 500 MG tablet, Take 1,000 mg by mouth daily as needed for mild pain.   naproxen sodium (ALEVE) 220 MG tablet, Take 440 mg by mouth daily as needed. (Patient not taking: Reported on 11/16/2023)   Current Outpatient Medications (Other):    buPROPion (WELLBUTRIN SR) 200 MG 12 hr tablet, Take 200 mg by mouth daily.   Cholecalciferol (VITAMIN D3 PO), Take by mouth.   hydroxychloroquine  (PLAQUENIL ) 200 MG tablet, Take 1 tablet (200 mg total) by mouth 2 (two) times daily.   letrozole (FEMARA) 2.5 MG tablet, Take by mouth.   MAGNESIUM PO, Take by mouth.   Omega-3 Fatty Acids (OMEGA 3 PO), Take by mouth daily.   Prenatal Vit-Fe Fumarate-FA (PRENATAL PO), Take by mouth daily.    Turmeric (CURCUMIN 95) 500 MG CAPS,   Medical History:  Past Medical History:  Diagnosis Date   Abnormal Pap smear of cervix    LGSIL 2014 & 2015, ASCUS HPV HR+ 2016, ASCUS HPV HR+ 2017, 12-26-16 LGSIL   Achilles rupture, left 05/13/2014   Irregular periods    has IUD   Lupus    Migraines    STD (sexually transmitted disease)    HSV2   Allergies:  Allergies  Allergen Reactions   Amoxicillin Hives   Other Swelling    SOMETHING IN MIDDLE EASTERN FOOD CAUSES SWELLING SOMETHING IN MIDDLE EASTERN FOOD CAUSES SWELLING   Ibuprofen     hives     Surgical History:  She  has a past surgical history that includes Wisdom tooth extraction; Achilles tendon surgery (Left, 05/24/2014); Colposcopy; and Intrauterine device (iud) insertion. Family History:  Her family history includes Depression in her father; Heart disease in her father; Hypertension in her father and mother; Prostate cancer in her father; Rheum arthritis in her father; Thyroid disease in her sister.  REVIEW OF SYSTEMS  : All other systems reviewed and negative except where noted in the History of Present Illness.  PHYSICAL EXAM: BP 120/76   Pulse 66   Ht 5' 5 (1.651 m)   Wt 275 lb 4 oz (124.9 kg)   BMI 45.80 kg/m  Physical Exam   GENERAL APPEARANCE: Well nourished, in no apparent distress. HEENT: No cervical lymphadenopathy, unremarkable thyroid, sclerae anicteric, conjunctiva pink. RESPIRATORY: Respiratory effort normal, BS equal bilateral without rales, rhonchi, wheezing. CARDIO: RRR with no MRGs, peripheral pulses intact. ABDOMEN: Soft, non distended, active bowel sounds in all 4 quadrants, no tenderness to palpation, no rebound, no mass appreciated. RECTAL: No external hemorrhoids, red rash around anus, no rectal masses, stool hemoccult negative, internal hemorrhoids, anal fissure. MUSCULOSKELETAL: Full ROM, normal gait, without edema. SKIN: Dry, intact without rashes or lesions. No jaundice. NEURO: Alert, oriented,  no focal deficits. PSYCH: Cooperative, normal mood and affect.      Alan JONELLE Coombs, PA-C 12:14 PM

## 2023-11-16 NOTE — Patient Instructions (Addendum)
 Your provider has requested that you go to the basement level for lab work before leaving today. Press B on the elevator. The lab is located at the first door on the left as you exit the elevator.  Due to recent changes in healthcare laws, you may see the results of your imaging and laboratory studies on MyChart before your provider has had a chance to review them.  We understand that in some cases there may be results that are confusing or concerning to you. Not all laboratory results come back in the same time frame and the provider may be waiting for multiple results in order to interpret others.  Please give us  48 hours in order for your provider to thoroughly review all the results before contacting the office for clarification of your results.    Your provider has requested that you have an abdominal x ray before leaving today. Please go to the basement floor to our Radiology department for the test.  You have been scheduled for an abdominal ultrasound at [facility] on Thursday, 11/19/23 at 2:30 pm. Please arrive 15 minutes prior to your appointment for registration. Make certain not to have anything to eat or drink 6 hours prior to your appointment. Should you need to reschedule your appointment, please contact radiology at [phone]. This test typically takes about 30 minutes to perform.  Address:  [address] [place], [place] [address]     Anal Fissure, Adult  A fissure is a linear defect in the anal mucosa, symptoms include burning, itching, discomfort especially with a bowel movement with associated rectal bleeding.  Risk factors include low fiber diet, chronic constipation and straining. Anal fissures can take a very long time to heal so this will be a 2 to 67-month process.  Treatment for a fissure includes:  -decreasing time in the toilet should not be more than 5 minutes -adding fiber supplement such as Benefiber or  Citrucel -increasing water. -There is a lubricating suppository over-the-counter called Calmol-4 you can get from [facility] or from your pharmacy (may have to order) that I want to do twice daily morning and evening for 8 weeks.  This is a 60 to 80% success rate. -I am also going to send in a calcium channel blocker cream to a compound pharmacy, apply twice daily for 12 weeks. If  Diltiazem/lidocaine  3 x daily for 2 months sent to compound pharmacy     Sent this medication to a compound pharmacy:  [facility] [address] [place], [place], [place] [address]  [phone]   Please DO NOT go directly from our office to pick up this medication! Give the pharmacy 1 day to process the prescription. Extra time is required for them to compound your medication.   Please take your proton pump inhibitor medication, pepcid  20 mg daily  Please take this medication 30 minutes to 1 hour before meals- this makes it more effective.  Avoid spicy and acidic foods Avoid fatty foods Limit your intake of coffee, tea, alcohol, and carbonated drinks Work to maintain a healthy weight Keep the head of the bed elevated at least 3 inches with blocks or a wedge pillow if you are having any nighttime symptoms Stay upright for 2 hours after eating Avoid meals and snacks three to four hours before bedtime  FIBER SUPPLEMENT You can do metamucil or fibercon once or twice a day but if this causes gas/bloating please switch to Benefiber or Citracel.  Fiber is  good for constipation/diarrhea/irritable bowel syndrome.  It can also help with weight loss and can help lower your bad cholesterol (LDL).  Please do 1 TBSP in the morning in water, coffee, or tea.  It can take up to a month before you can see a difference with your bowel movements.  It is cheapest from [employer], [employer], [employer].    Thank you for trusting me with your gastrointestinal care!   Alan Coombs,  PA-C   _______________________________________________________  If your blood pressure at your visit was 140/90 or greater, please contact your primary care physician to follow up on this.  _______________________________________________________  If you are age 33 or older, your body mass index should be between 23-30. Your Body mass index is 45.8 kg/m. If this is out of the aforementioned range listed, please consider follow up with your Primary Care Provider.  If you are age 45 or younger, your body mass index should be between 19-25. Your Body mass index is 45.8 kg/m. If this is out of the aformentioned range listed, please consider follow up with your Primary Care Provider.   ________________________________________________________  The [facility] [place] providers would like to encourage you to use MYCHART to communicate with providers for non-urgent requests or questions.  Due to long hold times on the telephone, sending your provider a message by [facility] may be a faster and more efficient way to get a response.  Please allow 48 business hours for a response.  Please remember that this is for non-urgent requests.  _______________________________________________________  [facility] is using a team-based approach to care.  Your team is made up of your doctor and two to three APPS. Our APPS (Nurse Practitioners and Physician Assistants) work with your physician to ensure care continuity for you. They are fully qualified to address your health concerns and develop a treatment plan. They communicate directly with your gastroenterologist to care for you. Seeing the Advanced Practice Practitioners on your physician's team can help you by facilitating care more promptly, often allowing for earlier appointments, access to diagnostic testing, procedures, and other specialty referrals.

## 2023-11-17 ENCOUNTER — Other Ambulatory Visit

## 2023-11-17 DIAGNOSIS — R1033 Periumbilical pain: Secondary | ICD-10-CM

## 2023-11-17 DIAGNOSIS — R110 Nausea: Secondary | ICD-10-CM

## 2023-11-17 DIAGNOSIS — K219 Gastro-esophageal reflux disease without esophagitis: Secondary | ICD-10-CM

## 2023-11-18 LAB — TISSUE TRANSGLUTAMINASE, IGA: (tTG) Ab, IgA: <1 U/mL

## 2023-11-18 LAB — IGA: Immunoglobulin A: 287 mg/dL (ref 47–310)

## 2023-11-19 ENCOUNTER — Ambulatory Visit: Payer: Self-pay | Admitting: Physician Assistant

## 2023-11-19 ENCOUNTER — Ambulatory Visit

## 2023-11-19 LAB — CALPROTECTIN, FECAL: Calprotectin, Fecal: 16 ug/g (ref 0–120)

## 2023-11-24 ENCOUNTER — Telehealth: Payer: Self-pay | Admitting: Physician Assistant

## 2023-12-02 ENCOUNTER — Encounter: Payer: Self-pay | Admitting: Physician Assistant

## 2023-12-21 ENCOUNTER — Encounter: Payer: Self-pay | Admitting: Radiology

## 2023-12-23 ENCOUNTER — Other Ambulatory Visit: Payer: Self-pay | Admitting: Physician Assistant

## 2023-12-23 DIAGNOSIS — M3219 Other organ or system involvement in systemic lupus erythematosus: Secondary | ICD-10-CM

## 2023-12-23 NOTE — Telephone Encounter (Signed)
 Last Fill: 09/22/2023  Eye exam: 01/28/2023 WNL   Labs: 11/16/2023 CBC and CMP WNL  Next Visit: 03/18/2024  Last Visit: 10/16/2023  IK:Nuyzm systemic lupus erythematosus with other organ involvement (HCC)   Current Dose per office note 10/16/2023: Plaquenil  200 mg 1 tablet by mouth twice daily.   Okay to refill Plaquenil ?

## 2023-12-29 ENCOUNTER — Ambulatory Visit
Admission: RE | Admit: 2023-12-29 | Discharge: 2023-12-29 | Disposition: A | Discharge status: Home / Self Care | Source: Ambulatory Visit | Attending: Physician Assistant | Admitting: Physician Assistant

## 2023-12-29 DIAGNOSIS — R1033 Periumbilical pain: Secondary | ICD-10-CM | POA: Insufficient documentation

## 2024-01-06 NOTE — Telephone Encounter (Signed)
 I spoke to [facility] and I advised her of her US  results and I explained Amanda's recommendations.  I told her to let us  know if she would like for us  to refer her to [facility]) for surgical evaluation.

## 2024-03-01 ENCOUNTER — Other Ambulatory Visit (INDEPENDENT_AMBULATORY_CARE_PROVIDER_SITE_OTHER)

## 2024-03-01 ENCOUNTER — Encounter: Payer: Self-pay | Admitting: Physician Assistant

## 2024-03-01 ENCOUNTER — Ambulatory Visit: Admitting: Physician Assistant

## 2024-03-01 DIAGNOSIS — M79671 Pain in right foot: Secondary | ICD-10-CM

## 2024-03-01 DIAGNOSIS — M79672 Pain in left foot: Secondary | ICD-10-CM

## 2024-03-01 NOTE — Progress Notes (Signed)
 "  Office Visit Note   Patient: Tammy Giles           Date of Birth: 1985/03/09           MRN: [id number] Visit Date: 03/01/2024              Requested by: Gerome Brunet, DO [address] [place],  [place] [address] PCP: Gerome Brunet, DO   Assessment & Plan: Visit Diagnoses:  1. Bilateral foot pain     Plan: Impression is bilateral foot pain.  I feel her pain could be coming from compression of the toes from wearing a shoe too small.  She will continue wearing her crocs as well as her new shoes that are the correct size.  Have also recommended topical Voltaren.  She will follow-up with Dr. Harden if her symptoms do not improve over the next 6 weeks.  Call with concerns or questions.  Follow-Up Instructions: Return if symptoms worsen or fail to improve.   Orders:  Orders Placed This Encounter  Procedures   XR Foot Complete Left   XR Foot Complete Right   No orders of the defined types were placed in this encounter.     Procedures: No procedures performed   Clinical Data: No additional findings.   Subjective: Chief Complaint  Patient presents with   Right Foot - Pain   Left Foot - Pain    HPI patient is a pleasant 39 year old female who comes in today with bilateral foot pain.  In regards to the right foot, she has had pain to the medial aspect of the MTP joint.  She denies any injury or change in activity.  Pain occurs primarily when she is walking or when she is applying pressure to the medial side.  She has recently bought a new pair of workout shoes and has been wearing crocs which does seem to help.  She is not taking any medication for this.  In regards to the left foot, she has started having pain to the medial aspect of the midfoot.  Symptoms are intermittent.  No specific aggravators.  She does have a compression sleeve which seems to help.  Of note, when she went to the shoe store last week she was told that she has been wearing the wrong size  shoe.  Review of Systems as detailed in HPI.  All others reviewed and are negative.   Objective: Vital Signs: There were no vitals taken for this visit.  Physical Exam well-developed nourished female in no acute distress.  Alert and oriented x 3.  Ortho Exam right foot exam: Mild tenderness along the medial aspect of the MTP joint.  No pain with dorsiflexion.  She is neurovascular intact distally.  Left foot exam is unremarkable.  Specialty Comments:  INDICATION: Low back pain, unspecified back pain laterality, unspecified chronicity, unspecified whether sciatica present   COMPARISON: Plain films of the lumbar spine 11/07/2021   TECHNIQUE: MRI SPINE LUMBAR WO IV CONTRAST.   FINDINGS:  # Osseous structures: Vertebral body heights are maintained. No acute fracture. No pars defect appreciated. No destructive bony changes.  #  Alignment:No significant subluxation.  #  Conus medullaris/cauda equina: Conus appears normal and terminates at L1-2.   #  Lower thoracic spine: No significant spinal canal or foraminal stenosis.   #  T12-L1: Preservation of disc height. No focal disc herniation, significant spinal canal or foraminal stenosis.  #  L1-L2: Preservation of disc height. No focal disc herniation, significant  spinal canal or foraminal stenosis.  #  L2-L3: Preservation of disc height. No focal disc herniation, significant spinal canal or foraminal stenosis.  #  L3-L4: Preservation of disc height. No focal disc herniation, significant spinal canal or foraminal stenosis.  #  L4-L5: Preservation of disc height. No focal disc herniation, significant spinal canal or foraminal stenosis.  #  L5-S1: Preservation of disc height. No focal disc herniation, significant spinal canal or foraminal stenosis.   #  Paraspinal tissues: Unremarkable   #  Contrast: None given.   #  Additional comments: None.    IMPRESSION:  1. No focal disc herniation or significant stenosis. No nerve root impingement  appreciated.  2.  No acute bony abnormality.   Electronically Signed by: Elsie Dayhoff, MD on 03/07/2022 1:08 PM  Imaging: XR Foot Complete Right Result Date: 03/01/2024 Questionable cortical regularity along the medial aspect of the distal metatarsal  XR Foot Complete Left Result Date: 03/01/2024 No acute or structural abnormalities    PMFS History: Patient Active Problem List   Diagnosis Date Noted   Pain in left ankle and joints of left foot 03/18/2022   Cervical strain 12/11/2021   Class 2 obesity without serious comorbidity with body mass index (BMI) of 35.0 to 35.9 in adult 02/13/2017   Raynaud's syndrome without gangrene 02/13/2017   High risk medication use 02/13/2017   Dyslipidemia 12/12/2015   Vitamin D  deficiency 12/12/2015   Annual physical exam 12/11/2015   Migraine without aura and with status migrainosus, not intractable 12/11/2015   Obesity (BMI 35.0-39.9 without comorbidity) 12/11/2015   Encounter for long-term (current) use of high-risk medication 04/26/2014   Abnormal Pap smear of cervix 06/16/2013    Class: History of   Lupus (systemic lupus erythematosus) (HCC) 10/22/2011   Past Medical History:  Diagnosis Date   Abnormal Pap smear of cervix    LGSIL 2014 & 2015, ASCUS HPV HR+ 2016, ASCUS HPV HR+ 2017, 12-26-16 LGSIL   Achilles rupture, left 05/13/2014   Irregular periods    has IUD   Lupus    Migraines    STD (sexually transmitted disease)    HSV2    Family History  Problem Relation Age of Onset   Hypertension Mother    Hypertension Father    Depression Father    Prostate cancer Father    Heart disease Father    Rheum arthritis Father    Thyroid disease Sister     Past Surgical History:  Procedure Laterality Date   ACHILLES TENDON SURGERY Left 05/24/2014   Procedure: LEFT ACHILLES TENDON REPAIR;  Surgeon: Kay CHRISTELLA Cummins, MD;  Location: [facility];  Service: Orthopedics;  Laterality: Left;   COLPOSCOPY     2014, 2015, 2016, 2017  LGSIL, 2018   INTRAUTERINE DEVICE (IUD) INSERTION     insertion 07-18-16   WISDOM TOOTH EXTRACTION     Social History   Occupational History    Employer: [employer]  Tobacco Use   Smoking status: Never    Passive exposure: Never   Smokeless tobacco: Never  Vaping Use   Vaping status: Never Used  Substance and Sexual Activity   Alcohol use: Yes    Alcohol/week: 3.0 standard drinks of alcohol    Types: 3 Standard drinks or equivalent per week    Comment: rarely   Drug use: No   Sexual activity: Yes    Partners: Male    Birth control/protection: I.U.D.        "

## 2024-03-07 NOTE — Progress Notes (Unsigned)
 "  Office Visit Note  Patient: Tammy Giles             Date of Birth: 1985-11-14           MRN: [id number]             PCP: Gerome Brunet, DO Referring: Gerome Brunet, DO Visit Date: 03/18/2024 Occupation: Data Unavailable  Subjective:  No chief complaint on file.   History of Present Illness: Tammy Giles is a 39 y.o. female ***     Activities of Daily Living:  Patient reports morning stiffness for *** {minute/hour:19697}.   Patient {ACTIONS;DENIES/REPORTS:21021675::Denies} nocturnal pain.  Difficulty dressing/grooming: {ACTIONS;DENIES/REPORTS:21021675::Denies} Difficulty climbing stairs: {ACTIONS;DENIES/REPORTS:21021675::Denies} Difficulty getting out of chair: {ACTIONS;DENIES/REPORTS:21021675::Denies} Difficulty using hands for taps, buttons, cutlery, and/or writing: {ACTIONS;DENIES/REPORTS:21021675::Denies}  No Rheumatology ROS completed.   PMFS History:  Patient Active Problem List   Diagnosis Date Noted   Pain in left ankle and joints of left foot 03/18/2022   Cervical strain 12/11/2021   Class 2 obesity without serious comorbidity with body mass index (BMI) of 35.0 to 35.9 in adult 02/13/2017   Raynaud's syndrome without gangrene 02/13/2017   High risk medication use 02/13/2017   Dyslipidemia 12/12/2015   Vitamin D  deficiency 12/12/2015   Annual physical exam 12/11/2015   Migraine without aura and with status migrainosus, not intractable 12/11/2015   Obesity (BMI 35.0-39.9 without comorbidity) 12/11/2015   Encounter for long-term (current) use of high-risk medication 04/26/2014   Abnormal Pap smear of cervix 06/16/2013    Class: History of   Lupus (systemic lupus erythematosus) (HCC) 10/22/2011    Past Medical History:  Diagnosis Date   Abnormal Pap smear of cervix    LGSIL 2014 & 2015, ASCUS HPV HR+ 2016, ASCUS HPV HR+ 2017, 12-26-16 LGSIL   Achilles rupture, left 05/13/2014   Irregular periods    has IUD   Lupus    Migraines    STD  (sexually transmitted disease)    HSV2    Family History  Problem Relation Age of Onset   Hypertension Mother    Hypertension Father    Depression Father    Prostate cancer Father    Heart disease Father    Rheum arthritis Father    Thyroid disease Sister    Past Surgical History:  Procedure Laterality Date   ACHILLES TENDON SURGERY Left 05/24/2014   Procedure: LEFT ACHILLES TENDON REPAIR;  Surgeon: Kay CHRISTELLA Cummins, MD;  Location: [facility];  Service: Orthopedics;  Laterality: Left;   COLPOSCOPY     2014, 2015, 2016, 2017 LGSIL, 2018   INTRAUTERINE DEVICE (IUD) INSERTION     insertion 07-18-16   WISDOM TOOTH EXTRACTION     Social History[1] Social History   Social History Narrative   Not on file     Immunization History  Administered Date(s) Administered   Influenza Split 11/20/2014   Influenza,inj,Quad PF,6+ Mos 12/11/2015   PFIZER(Purple Top)SARS-COV-2 Vaccination 07/17/2019, 08/07/2019, 01/11/2020   Tdap 02/18/2007, 12/26/2016     Objective: Vital Signs: There were no vitals taken for this visit.   Physical Exam   Musculoskeletal Exam: ***  CDAI Exam: CDAI Score: -- Patient Global: --; Provider Global: -- Swollen: --; Tender: -- Joint Exam 03/18/2024   No joint exam has been documented for this visit   There is currently no information documented on the homunculus. Go to the Rheumatology activity and complete the homunculus joint exam.  Investigation: No additional findings.  Imaging: XR Foot Complete Right Result Date: 03/01/2024  Questionable cortical regularity along the medial aspect of the distal metatarsal  XR Foot Complete Left Result Date: 03/01/2024 No acute or structural abnormalities   Recent Labs: Lab Results  Component Value Date   WBC 5.8 11/16/2023   HGB 13.8 11/16/2023   PLT 213.0 11/16/2023   NA 137 11/16/2023   K 4.2 11/16/2023   CL 104 11/16/2023   CO2 28 11/16/2023   GLUCOSE 95 11/16/2023   BUN 12 11/16/2023    CREATININE 0.97 11/16/2023   BILITOT 0.3 11/16/2023   ALKPHOS 63 11/16/2023   AST 23 11/16/2023   ALT 17 11/16/2023   PROT 8.3 11/16/2023   ALBUMIN 4.2 11/16/2023   CALCIUM 9.6 11/16/2023   GFRAA 97 04/18/2020    Speciality Comments: PLQ Eye Exam: 01/28/2023 WNL @ [facility] Follow up in 12 months  Procedures:  No procedures performed Allergies: Amoxicillin, Other, and Ibuprofen   Assessment / Plan:     Visit Diagnoses: No diagnosis found.  Orders: No orders of the defined types were placed in this encounter.  No orders of the defined types were placed in this encounter.   Face-to-face time spent with patient was *** minutes. Greater than 50% of time was spent in counseling and coordination of care.  Follow-Up Instructions: No follow-ups on file.   Shelba SHAUNNA Potters, RT  Note - This record has been created using [employer].  Chart creation errors have been sought, but may not always  have been located. Such creation errors do not reflect on  the standard of medical care.    [1]  Social History Tobacco Use   Smoking status: Never    Passive exposure: Never   Smokeless tobacco: Never  Vaping Use   Vaping status: Never Used  Substance Use Topics   Alcohol use: Yes    Alcohol/week: 3.0 standard drinks of alcohol    Types: 3 Standard drinks or equivalent per week    Comment: rarely   Drug use: No   "

## 2024-03-18 ENCOUNTER — Ambulatory Visit: Admitting: Physician Assistant

## 2024-03-18 DIAGNOSIS — E559 Vitamin D deficiency, unspecified: Secondary | ICD-10-CM

## 2024-03-18 DIAGNOSIS — E785 Hyperlipidemia, unspecified: Secondary | ICD-10-CM

## 2024-03-18 DIAGNOSIS — R110 Nausea: Secondary | ICD-10-CM

## 2024-03-18 DIAGNOSIS — M6740 Ganglion, unspecified site: Secondary | ICD-10-CM

## 2024-03-18 DIAGNOSIS — G43001 Migraine without aura, not intractable, with status migrainosus: Secondary | ICD-10-CM

## 2024-03-18 DIAGNOSIS — I7300 Raynaud's syndrome without gangrene: Secondary | ICD-10-CM

## 2024-03-18 DIAGNOSIS — Z8719 Personal history of other diseases of the digestive system: Secondary | ICD-10-CM

## 2024-03-18 DIAGNOSIS — M3219 Other organ or system involvement in systemic lupus erythematosus: Secondary | ICD-10-CM

## 2024-03-18 DIAGNOSIS — R5383 Other fatigue: Secondary | ICD-10-CM

## 2024-03-18 DIAGNOSIS — Z79899 Other long term (current) drug therapy: Secondary | ICD-10-CM

## 2024-03-25 ENCOUNTER — Other Ambulatory Visit: Payer: Self-pay | Admitting: Physician Assistant

## 2024-03-25 DIAGNOSIS — M3219 Other organ or system involvement in systemic lupus erythematosus: Secondary | ICD-10-CM

## 2024-03-25 NOTE — Telephone Encounter (Signed)
 Last Fill: 12/23/2023  Eye exam: 01/28/2023 WNL   Labs: 11/16/2023 normal  Next Visit: 05/05/2024  Last Visit: 10/16/2023  IK:Nuyzm systemic lupus erythematosus with other organ involvement (HCC)   Current Dose per office note 11/16/2023: Plaquenil  200 mg 1 tablet by mouth twice daily.   Attempted to contact the patient and left a message advising she is due to update her eye exam.   Okay to refill Plaquenil ?

## 2024-05-05 ENCOUNTER — Ambulatory Visit: Admitting: Physician Assistant
